# Patient Record
Sex: Female | Born: 1935 | Race: Black or African American | Hispanic: No | Marital: Single | Smoking: Never smoker
Health system: Southern US, Community
[De-identification: ages and names within clinical notes are randomized; demographics above are authoritative.]

## PROBLEM LIST (undated history)

## (undated) DIAGNOSIS — M810 Age-related osteoporosis without current pathological fracture: Secondary | ICD-10-CM

## (undated) DIAGNOSIS — E785 Hyperlipidemia, unspecified: Secondary | ICD-10-CM

## (undated) DIAGNOSIS — N189 Chronic kidney disease, unspecified: Secondary | ICD-10-CM

## (undated) DIAGNOSIS — M109 Gout, unspecified: Secondary | ICD-10-CM

## (undated) DIAGNOSIS — I10 Essential (primary) hypertension: Secondary | ICD-10-CM

## (undated) DIAGNOSIS — F0390 Unspecified dementia without behavioral disturbance: Secondary | ICD-10-CM

## (undated) HISTORY — PX: CHOLECYSTECTOMY: SHX55

## (undated) HISTORY — DX: Chronic kidney disease, unspecified: N18.9

## (undated) HISTORY — DX: Gout, unspecified: M10.9

## (undated) HISTORY — DX: Hyperlipidemia, unspecified: E78.5

## (undated) HISTORY — DX: Age-related osteoporosis without current pathological fracture: M81.0

---

## 1999-07-01 LAB — HM COLONOSCOPY: HM Colonoscopy: NORMAL

## 2005-08-31 ENCOUNTER — Ambulatory Visit: Payer: Self-pay | Admitting: Gastroenterology

## 2006-02-27 ENCOUNTER — Emergency Department: Payer: Self-pay | Admitting: Emergency Medicine

## 2008-02-21 ENCOUNTER — Ambulatory Visit: Payer: Self-pay | Admitting: Ophthalmology

## 2008-03-04 ENCOUNTER — Ambulatory Visit: Payer: Self-pay | Admitting: Ophthalmology

## 2009-04-17 DIAGNOSIS — Q423 Congenital absence, atresia and stenosis of anus without fistula: Secondary | ICD-10-CM | POA: Insufficient documentation

## 2009-04-17 DIAGNOSIS — K5900 Constipation, unspecified: Secondary | ICD-10-CM | POA: Insufficient documentation

## 2012-05-08 ENCOUNTER — Ambulatory Visit: Payer: Self-pay | Admitting: Family Medicine

## 2014-02-13 LAB — LIPID PANEL
Cholesterol: 232 mg/dL — AB (ref 0–200)
HDL: 50 mg/dL (ref 35–70)
LDL Cholesterol: 150 mg/dL
Triglycerides: 158 mg/dL (ref 40–160)

## 2014-04-11 DIAGNOSIS — H4011X2 Primary open-angle glaucoma, moderate stage: Secondary | ICD-10-CM | POA: Diagnosis not present

## 2014-05-14 DIAGNOSIS — L209 Atopic dermatitis, unspecified: Secondary | ICD-10-CM | POA: Diagnosis not present

## 2014-05-31 DIAGNOSIS — R21 Rash and other nonspecific skin eruption: Secondary | ICD-10-CM | POA: Diagnosis not present

## 2014-06-20 DIAGNOSIS — R21 Rash and other nonspecific skin eruption: Secondary | ICD-10-CM | POA: Diagnosis not present

## 2014-07-18 DIAGNOSIS — L270 Generalized skin eruption due to drugs and medicaments taken internally: Secondary | ICD-10-CM | POA: Diagnosis not present

## 2014-08-01 DIAGNOSIS — I10 Essential (primary) hypertension: Secondary | ICD-10-CM | POA: Diagnosis not present

## 2014-08-01 DIAGNOSIS — I129 Hypertensive chronic kidney disease with stage 1 through stage 4 chronic kidney disease, or unspecified chronic kidney disease: Secondary | ICD-10-CM | POA: Diagnosis not present

## 2014-08-01 DIAGNOSIS — N183 Chronic kidney disease, stage 3 (moderate): Secondary | ICD-10-CM | POA: Diagnosis not present

## 2014-08-01 DIAGNOSIS — N390 Urinary tract infection, site not specified: Secondary | ICD-10-CM | POA: Diagnosis not present

## 2014-08-19 ENCOUNTER — Ambulatory Visit (INDEPENDENT_AMBULATORY_CARE_PROVIDER_SITE_OTHER): Payer: Medicare Other | Admitting: Family Medicine

## 2014-08-19 ENCOUNTER — Other Ambulatory Visit: Payer: Self-pay | Admitting: Family Medicine

## 2014-08-19 ENCOUNTER — Encounter: Payer: Self-pay | Admitting: Family Medicine

## 2014-08-19 ENCOUNTER — Encounter (INDEPENDENT_AMBULATORY_CARE_PROVIDER_SITE_OTHER): Payer: Self-pay

## 2014-08-19 VITALS — BP 124/70 | HR 86 | Temp 97.8°F | Resp 16 | Ht <= 58 in | Wt 146.0 lb

## 2014-08-19 DIAGNOSIS — Z0000 Encounter for general adult medical examination without abnormal findings: Secondary | ICD-10-CM | POA: Diagnosis not present

## 2014-08-19 DIAGNOSIS — Z1211 Encounter for screening for malignant neoplasm of colon: Secondary | ICD-10-CM | POA: Diagnosis not present

## 2014-08-19 DIAGNOSIS — Z780 Asymptomatic menopausal state: Secondary | ICD-10-CM | POA: Diagnosis not present

## 2014-08-19 NOTE — Progress Notes (Signed)
Name: Julia Herrera   MRN: [id number]    DOB: [birth year]   Date:08/19/2014       Progress Note  Subjective  Chief Complaint  Patient here today for Annual Physical.    HPI  81 yo female with no new problems.    Past Medical History  Diagnosis Date  . Hyperlipidemia   . Osteoporosis   . Chronic kidney disease     Past Surgical History  Procedure Laterality Date  . Cholecystectomy      No family history on file.  History   Social History  . Marital Status: Single    Spouse Name: N/A  . Number of Children: N/A  . Years of Education: N/A   Occupational History  . Not on file.   Social History Main Topics  . Smoking status: Current Every Day Smoker  . Smokeless tobacco: Current User    Types: Snuff  . Alcohol Use: No  . Drug Use: No  . Sexual Activity: Not on file   Other Topics Concern  . Not on file   Social History Narrative  . No narrative on file    No current outpatient prescriptions on file.  Allergies  Allergen Reactions  . Allopurinol Other (See Comments)    dizziness  . Fosamax [Alendronate] Nausea Only  . Niaspan [Niacin Er] Other (See Comments)    unknown  . Zocor [Simvastatin] Other (See Comments)    Muscle pain     Review of Systems  Constitutional: Negative for fever.  HENT: Negative.   Eyes: Negative.   Respiratory: Negative.   Cardiovascular: Negative.   Gastrointestinal: Negative.   Genitourinary: Negative.   Musculoskeletal: Positive for back pain and joint pain.  Skin: Positive for itching and rash.  Neurological: Negative.   Endo/Heme/Allergies: Negative.   Psychiatric/Behavioral: Positive for depression.    Objective  There were no vitals filed for this visit.  Physical Exam  Constitutional: She is oriented to person, place, and time and well-developed, well-nourished, and in no distress.  HENT:  Head: Normocephalic and atraumatic.  Eyes: Pupils are equal, round, and reactive to light.  Neck: Normal range of  motion. Neck supple. No tracheal deviation present. No thyromegaly present.  Cardiovascular: Normal rate, regular rhythm, normal heart sounds and intact distal pulses.  Exam reveals no gallop.   No murmur heard. Pulmonary/Chest: Effort normal and breath sounds normal.  Abdominal: Soft. Bowel sounds are normal. She exhibits no distension and no mass. There is no tenderness. There is no rebound and no guarding.  Musculoskeletal: Normal range of motion. She exhibits no edema or tenderness.  Lymphadenopathy:    She has no cervical adenopathy.  Neurological: She is alert and oriented to person, place, and time. No cranial nerve deficit. She exhibits abnormal muscle tone. Gait normal. Coordination normal.  Skin: Skin is warm and dry. Rash (RUE with hyperpigmented rash) noted.  Psychiatric: Memory normal.    Depression screen PHQ 2/9 08/19/2014  Decreased Interest 0  Down, Depressed, Hopeless 0  PHQ - 2 Score 0       No results found for this or any previous visit (from the past 2160 hour(s)).   Assessment & Plan  1. Encounter for initial preventive physical examination covered by Medicare unremarkable  2. Post-menopausal  - DG Bone Density; Future  3. Screening for colon cancer - [place] Stool Card-TAKE HOME

## 2014-08-19 NOTE — Patient Instructions (Signed)
F/u in 4 mo

## 2014-08-26 ENCOUNTER — Emergency Department: Payer: Medicare Other

## 2014-08-26 ENCOUNTER — Encounter: Payer: Self-pay | Admitting: *Deleted

## 2014-08-26 ENCOUNTER — Emergency Department
Admission: EM | Admit: 2014-08-26 | Discharge: 2014-08-26 | Disposition: A | Discharge status: Home / Self Care | Payer: Medicare Other | Attending: Emergency Medicine | Admitting: Emergency Medicine

## 2014-08-26 DIAGNOSIS — I10 Essential (primary) hypertension: Secondary | ICD-10-CM | POA: Diagnosis not present

## 2014-08-26 DIAGNOSIS — Z87891 Personal history of nicotine dependence: Secondary | ICD-10-CM | POA: Insufficient documentation

## 2014-08-26 DIAGNOSIS — R55 Syncope and collapse: Secondary | ICD-10-CM | POA: Diagnosis not present

## 2014-08-26 DIAGNOSIS — Z79899 Other long term (current) drug therapy: Secondary | ICD-10-CM | POA: Diagnosis not present

## 2014-08-26 DIAGNOSIS — I951 Orthostatic hypotension: Secondary | ICD-10-CM | POA: Insufficient documentation

## 2014-08-26 DIAGNOSIS — R531 Weakness: Secondary | ICD-10-CM | POA: Diagnosis not present

## 2014-08-26 DIAGNOSIS — G311 Senile degeneration of brain, not elsewhere classified: Secondary | ICD-10-CM | POA: Diagnosis not present

## 2014-08-26 DIAGNOSIS — I959 Hypotension, unspecified: Secondary | ICD-10-CM | POA: Diagnosis not present

## 2014-08-26 HISTORY — DX: Essential (primary) hypertension: I10

## 2014-08-26 LAB — COMPREHENSIVE METABOLIC PANEL
ALBUMIN: 3.8 g/dL (ref 3.5–5.0)
ALT: 9 U/L — ABNORMAL LOW (ref 14–54)
ANION GAP: 8 (ref 5–15)
AST: 19 U/L (ref 15–41)
Alkaline Phosphatase: 53 U/L (ref 38–126)
BUN: 12 mg/dL (ref 6–20)
CHLORIDE: 105 mmol/L (ref 101–111)
CO2: 25 mmol/L (ref 22–32)
CREATININE: 2 mg/dL — AB (ref 0.44–1.00)
Calcium: 9 mg/dL (ref 8.9–10.3)
GFR calc Af Amer: 25 mL/min — ABNORMAL LOW (ref >60)
GFR calc non Af Amer: 21 mL/min — ABNORMAL LOW (ref >60)
Glucose, Bld: 103 mg/dL — ABNORMAL HIGH (ref 65–99)
POTASSIUM: 3.7 mmol/L (ref 3.5–5.1)
Sodium: 138 mmol/L (ref 135–145)
TOTAL PROTEIN: 7 g/dL (ref 6.5–8.1)
Total Bilirubin: 1 mg/dL (ref 0.3–1.2)

## 2014-08-26 LAB — CBC WITH DIFFERENTIAL/PLATELET
BASOS ABS: 0 10*3/uL (ref 0–0.1)
Basophils Relative: 1 %
EOS PCT: 9 %
Eosinophils Absolute: 0.3 10*3/uL (ref 0–0.7)
HCT: 42.6 % (ref 35.0–47.0)
Hemoglobin: 13.6 g/dL (ref 12.0–16.0)
Lymphocytes Relative: 30 %
Lymphs Abs: 1.1 10*3/uL (ref 1.0–3.6)
MCH: 28.1 pg (ref 26.0–34.0)
MCHC: 32 g/dL (ref 32.0–36.0)
MCV: 87.7 fL (ref 80.0–100.0)
Monocytes Absolute: 0.4 10*3/uL (ref 0.2–0.9)
Monocytes Relative: 12 %
Neutro Abs: 1.8 10*3/uL (ref 1.4–6.5)
Neutrophils Relative %: 48 %
PLATELETS: 274 10*3/uL (ref 150–440)
RBC: 4.86 MIL/uL (ref 3.80–5.20)
RDW: 16 % — AB (ref 11.5–14.5)
WBC: 3.7 10*3/uL (ref 3.6–11.0)

## 2014-08-26 LAB — TROPONIN I

## 2014-08-26 MED ORDER — SODIUM CHLORIDE 0.9 % IV BOLUS (SEPSIS)
500.0000 mL | INTRAVENOUS | Status: AC
  Administered 2014-08-26: 500 mL via INTRAVENOUS

## 2014-08-26 NOTE — ED Notes (Signed)
Pt to ED from home via EMS due to syncopal episode this am. Pt states she was making breakfast when she "went out" Pt states "I just squatted down so I didn't fall." Denies hitting head, denies any pain. Pt repeating "I just feel bad" Vitals wnl at this time. Per EMS pt hx of hypertension. Pt AAOx4 on arrival. No acute distress noted.

## 2014-08-26 NOTE — ED Provider Notes (Signed)
[facility] Emergency Department Provider Note  ____________________________________________  Time seen: Approximately 12:23 PM  I have reviewed the triage vital signs and the nursing notes.   HISTORY  Chief Complaint Loss of Consciousness and Weakness    HPI Julia Herrera is a 81 y.o. female who presents with a history of near-syncope.  She states that she was working in the kitchen and then was walking back out to the living room when she became lightheaded and thought that she was going to pass out.  She squatted down so that she did not fall.  Her daughter was with her and confirms that there is no loss of consciousness but that she sat on the floor and just looking around for a short period of time.  Currently the patient denies any symptoms or problems.  She states that she did not have any chest pain, shortness of breath, abdominal pain, nausea, or vomiting.  She has not had any dysuria recently.  She is alert and oriented here in the emergency department and her daughter states that she is at her baseline mental status.She has had no numbness or weakness in any of her extremities and no difficulty speaking.   Past Medical History  Diagnosis Date  . Hyperlipidemia   . Osteoporosis   . Chronic kidney disease   . Hypertension     There are no active problems to display for this patient.   Past Surgical History  Procedure Laterality Date  . Cholecystectomy      Current Outpatient Rx  Name  Route  Sig  Dispense  Refill  . amLODipine (NORVASC) 5 MG tablet      5 mg.         . doxepin (SINEQUAN) 25 MG capsule               . omeprazole (PRILOSEC) 20 MG capsule   Oral   Take 20 mg by mouth daily.         . valsartan-hydrochlorothiazide (DIOVAN-HCT) 160-12.5 MG per tablet      1 tablet.         Marland Kitchen ZETIA 10 MG tablet      TAKE 1 TABLET BY MOUTH EVERY DAY.   30 tablet   5     Allergies Allopurinol; Fosamax; Niaspan; and  Zocor  History reviewed. No pertinent family history.  Social History History  Substance Use Topics  . Smoking status: Former Research scientist (life sciences)  . Smokeless tobacco: Current User    Types: Snuff  . Alcohol Use: No     Comment: Former    Review of Systems Constitutional: No fever/chills Eyes: No visual changes. ENT: No sore throat. Cardiovascular: Denies chest pain. Respiratory: Denies shortness of breath. Gastrointestinal: No abdominal pain.  No nausea, no vomiting.  No diarrhea.  No constipation. Genitourinary: Negative for dysuria. Musculoskeletal: Negative for back pain. Skin: Negative for rash. Neurological: Negative for headaches, focal weakness or numbness.  Previously lightheaded, now resolved  10-point ROS otherwise negative.  ____________________________________________   PHYSICAL EXAM:  VITAL SIGNS: ED Triage Vitals  Enc Vitals Group     BP 08/26/14 1205 113/74 mmHg     Pulse Rate 08/26/14 1205 75     Resp 08/26/14 1205 22     Temp 08/26/14 1205 98.4 F (36.9 C)     Temp Source 08/26/14 1205 Oral     SpO2 08/26/14 1205 96 %     Weight 08/26/14 1205 147 lb 14.9 oz (67.1 kg)  Height 08/26/14 1205 5' (1.524 m)     Head Cir --      Peak Flow --      Pain Score --      Pain Loc --      Pain Edu? --      Excl. in [place]? --     Constitutional: Alert and oriented elderly female. Well appearing and in no acute distress. Eyes: Conjunctivae are normal. PERRL. EOMI. Head: Atraumatic. Nose: No congestion/rhinnorhea. Mouth/Throat: Mucous membranes are moist.  Oropharynx non-erythematous. Neck: No stridor.  No cervical spine tenderness to palpation. Cardiovascular: Normal rate, regular rhythm. Grossly normal heart sounds.  Good peripheral circulation. Respiratory: Normal respiratory effort.  No retractions. Lungs CTAB. Gastrointestinal: Soft and nontender. No distention. No abdominal bruits. No CVA tenderness. Musculoskeletal: No lower extremity tenderness nor edema.  No  joint effusions. Neurologic:  Normal speech and language. No gross focal neurologic deficits are appreciated. Speech is normal.  Skin:  Skin is warm, dry and intact. No rash noted. Psychiatric: Mood and affect are normal. Speech and behavior are normal.  ____________________________________________   LABS (all labs ordered are listed, but only abnormal results are displayed)  Labs Reviewed  CBC WITH DIFFERENTIAL/PLATELET - Abnormal; Notable for the following:    RDW 16.0 (*)    All other components within normal limits  COMPREHENSIVE METABOLIC PANEL - Abnormal; Notable for the following:    Glucose, Bld 103 (*)    Creatinine, Ser 2.00 (*)    ALT 9 (*)    GFR calc non Af Amer 21 (*)    GFR calc Af Amer 25 (*)    All other components within normal limits  TROPONIN I   ____________________________________________  EKG  ED ECG REPORT I, Saffron Busey, the attending physician, personally viewed and interpreted this ECG.   Date: 08/26/2014  EKG Time: 12:00  Rate: 71  Rhythm: normal sinus rhythm, frequent PVC's noted  Axis: Normal  Intervals:left bundle branch block  ST&T Change: Non-specific ST segment / T-wave changes, but no evidence of acute ischemia. Does not meet Sgarbossa criteria for STEMI w/ LBBB   ____________________________________________  RADIOLOGY  Ct Head Wo Contrast  08/26/2014   CLINICAL DATA:  Syncope  EXAM: CT HEAD WITHOUT CONTRAST  TECHNIQUE: Contiguous axial images were obtained from the base of the skull through the vertex without intravenous contrast.  COMPARISON:  None.  FINDINGS: No evidence of parenchymal hemorrhage or extra-axial fluid collection. No mass lesion, mass effect, or midline shift.  No CT evidence of acute infarction.  Subcortical white matter and periventricular small vessel ischemic changes. Intracranial atherosclerosis  Global cortical atrophy.  No ventriculomegaly.  The visualized paranasal sinuses are essentially clear. The mastoid  air cells are unopacified.  No evidence of calvarial fracture.  IMPRESSION: No evidence of acute intracranial abnormality.  Atrophy with small vessel ischemic changes.   Electronically Signed   By: Julian Hy M.D.   On: 08/26/2014 14:12    ____________________________________________   PROCEDURES  Procedure(s) performed: None  Critical Care performed: No ____________________________________________   INITIAL IMPRESSION / ASSESSMENT AND PLAN / ED COURSE  Pertinent labs & imaging results that were available during my care of the patient were reviewed by me and considered in my medical decision making (see chart for details).  The patient is well-appearing with normal vitals.  She is asymptomatic at this time.  I believe that she had an episode of orthostatic hypotension or perhaps a vasovagal episode.  However, given her age,  we will perform a basic workup.  Her EKG, while notable for frequent PVCs, is generally unremarkable.  I doubt that she had an acute cardiac event given that she is otherwise asymptomatic, though it is still possible.  Most likely she is somewhat volume depleted.  She reports chronic kidney disease states that she has not been eating or drinking well.  She has no respiratory symptoms and I do not believe a chest x-ray would be helpful.  She has no urologic symptoms or complaints at this time and is not exhibiting any signs or symptoms of altered mental status to suggest a UTI.  I will provide a small normal saline fluid bolus of 500 mL and obtain a CT scan of her head and reassess.  ----------------------------------------- 3:45 PM on 08/26/2014 -----------------------------------------  Patient states she feels better and "I'm ready to Josephine!!"  I discussed with 2 of her daughters the need for outpatient follow-up and her relatively reassuring exam today.  They understand and agree. ____________________________________________  FINAL CLINICAL IMPRESSION(S) / ED  DIAGNOSES  Final diagnoses:  Orthostatic hypotension      NEW MEDICATIONS STARTED DURING THIS VISIT:  Discharge Medication List as of 08/26/2014  3:47 PM       Hinda Kehr, MD 08/26/14 2228

## 2014-08-26 NOTE — Discharge Instructions (Signed)
We believe you got lightheaded today because of being a little bit dehydrated.  You felt better after some IV fluids.  Please follow-up with your regular doctor at the next available appointment to have your labs rechecked and to discuss your episode with him.  If you develop new or worsening symptoms that concern you, please return to the emergency department.   Near-Syncope Near-syncope (commonly known as near fainting) is sudden weakness, dizziness, or feeling like you might pass out. During an episode of near-syncope, you may also develop pale skin, have tunnel vision, or feel sick to your stomach (nauseous). Near-syncope may occur when getting up after sitting or while standing for a long time. It is caused by a sudden decrease in blood flow to the brain. This decrease can result from various causes or triggers, most of which are not serious. However, because near-syncope can sometimes be a sign of something serious, a medical evaluation is required. The specific cause is often not determined. HOME CARE INSTRUCTIONS  Monitor your condition for any changes. The following actions may help to alleviate any discomfort you are experiencing:  Have someone stay with you until you feel stable.  Lie down right away and prop your feet up if you start feeling like you might faint. Breathe deeply and steadily. Wait until all the symptoms have passed. Most of these episodes last only a few minutes. You may feel tired for several hours.   Drink enough fluids to keep your urine clear or pale yellow.   If you are taking blood pressure or heart medicine, get up slowly when seated or lying down. Take several minutes to sit and then stand. This can reduce dizziness.  Follow up with your health care provider as directed. SEEK IMMEDIATE MEDICAL CARE IF:   You have a severe headache.   You have unusual pain in the chest, abdomen, or back.   You are bleeding from the mouth or rectum, or you have black or  tarry stool.   You have an irregular or very fast heartbeat.   You have repeated fainting or have seizure-like jerking during an episode.   You faint when sitting or lying down.   You have confusion.   You have difficulty walking.   You have severe weakness.   You have vision problems.  MAKE SURE YOU:   Understand these instructions.  Will watch your condition.  Will get help right away if you are not doing well or get worse. Document Released: 03/01/2005 Document Revised: 03/06/2013 Document Reviewed: 08/04/2012 [facility] Patient Information 2015 [place], [place]. This information is not intended to replace advice given to you by your health care provider. Make sure you discuss any questions you have with your health care provider.

## 2014-08-29 DIAGNOSIS — L270 Generalized skin eruption due to drugs and medicaments taken internally: Secondary | ICD-10-CM | POA: Diagnosis not present

## 2014-10-10 DIAGNOSIS — H4011X2 Primary open-angle glaucoma, moderate stage: Secondary | ICD-10-CM | POA: Diagnosis not present

## 2014-12-23 ENCOUNTER — Encounter: Payer: Self-pay | Admitting: Family Medicine

## 2014-12-23 ENCOUNTER — Ambulatory Visit (INDEPENDENT_AMBULATORY_CARE_PROVIDER_SITE_OTHER): Payer: Medicare Other | Admitting: Family Medicine

## 2014-12-23 VITALS — BP 118/68 | HR 105 | Temp 98.7°F | Resp 16 | Ht 60.0 in | Wt 147.3 lb

## 2014-12-23 DIAGNOSIS — K2100 Gastro-esophageal reflux disease with esophagitis, without bleeding: Secondary | ICD-10-CM | POA: Insufficient documentation

## 2014-12-23 DIAGNOSIS — N184 Chronic kidney disease, stage 4 (severe): Secondary | ICD-10-CM | POA: Diagnosis not present

## 2014-12-23 DIAGNOSIS — M810 Age-related osteoporosis without current pathological fracture: Secondary | ICD-10-CM

## 2014-12-23 DIAGNOSIS — E785 Hyperlipidemia, unspecified: Secondary | ICD-10-CM

## 2014-12-23 DIAGNOSIS — E669 Obesity, unspecified: Secondary | ICD-10-CM | POA: Insufficient documentation

## 2014-12-23 DIAGNOSIS — M109 Gout, unspecified: Secondary | ICD-10-CM | POA: Insufficient documentation

## 2014-12-23 DIAGNOSIS — Z23 Encounter for immunization: Secondary | ICD-10-CM | POA: Diagnosis not present

## 2014-12-23 DIAGNOSIS — I10 Essential (primary) hypertension: Secondary | ICD-10-CM | POA: Diagnosis not present

## 2014-12-23 DIAGNOSIS — I129 Hypertensive chronic kidney disease with stage 1 through stage 4 chronic kidney disease, or unspecified chronic kidney disease: Secondary | ICD-10-CM | POA: Insufficient documentation

## 2014-12-23 DIAGNOSIS — L209 Atopic dermatitis, unspecified: Secondary | ICD-10-CM | POA: Insufficient documentation

## 2014-12-23 DIAGNOSIS — N183 Chronic kidney disease, stage 3 unspecified: Secondary | ICD-10-CM | POA: Insufficient documentation

## 2014-12-23 NOTE — Progress Notes (Signed)
Name: Julia Herrera   MRN: [id number]    DOB: [birth year]   Date:12/23/2014       Progress Note  Subjective  Chief Complaint  Chief Complaint  Patient presents with  . Gout  . Hyperlipidemia  . Osteoporosis    HPI  Hypertension   Patient presents for follow-up of hypertension. It has been presenor over over 5 years.  Patient states that there is compliance with medical regimen which consists of amlodipine 5 mg daily and valsartan HCT 160-12.5 . There is no end organ disease. Cardiac risk factors include hypertension hyperlipidemia and diabetes.  Exercise regimen consist of minimal minimal sodium is minimal salt restriction .  Diet consist of minimal sodium restriction .  Hyperlipidemia  Patient has a history of hyperlipidemia for over 5 years.  Current medical regimen consist of zetia 10 mg daily and she is statin intolerant   .  Compliance is good .  Diet and exercise are currently followed minimally. .  Risk factors for cardiovascular disease include hyperlipidemia , age, hypertension .   There have been no side effects from the medication.      Osteoporosis  Patient has had bone density over 4 years ago.  Chronic kidney disease patient is currently being followed by nephrologist her last recorded EGFR the rehabilitation is 25 6/16  Past Medical History  Diagnosis Date  . Hyperlipidemia   . Osteoporosis   . Chronic kidney disease   . Hypertension     Social History  Substance Use Topics  . Smoking status: Former Research scientist (life sciences)  . Smokeless tobacco: Current User    Types: Snuff  . Alcohol Use: No     Comment: Former     Current outpatient prescriptions:  .  amLODipine (NORVASC) 5 MG tablet, 5 mg., Disp: , Rfl:  .  doxepin (SINEQUAN) 25 MG capsule, , Disp: , Rfl:  .  omeprazole (PRILOSEC) 20 MG capsule, Take 20 mg by mouth daily., Disp: , Rfl:  .  potassium chloride SA (KLOR-CON M20) 20 MEQ tablet, KLOR-CON M20 ORAL TABLET CONTROLLED RELEASE 20 MEQ  1 Every Day for 0 days   Quantity: 30.00;  Refills: 0   Ordered :27-Feb-2008  Ashok Norris MD;  Started 27-Feb-2008 Discontinued Comments: DX: 401.1 helenj (02/27/2008) - [facility], Disp: , Rfl:  .  sulfamethoxazole-trimethoprim (BACTRIM DS,SEPTRA DS) 800-160 MG tablet, Take 1 tablet by mouth 2 (two) times daily., Disp: , Rfl:  .  valsartan-hydrochlorothiazide (DIOVAN-HCT) 160-12.5 MG per tablet, 1 tablet., Disp: , Rfl:  .  ZETIA 10 MG tablet, TAKE 1 TABLET BY MOUTH EVERY DAY., Disp: 30 tablet, Rfl: 5  Allergies  Allergen Reactions  . Allopurinol Other (See Comments)    dizziness  . Fosamax [Alendronate] Nausea Only  . Niaspan [Niacin Er] Other (See Comments)    unknown  . Zocor [Simvastatin] Other (See Comments)    Muscle pain    Review of Systems  Constitutional: Negative for fever, chills and weight loss.  HENT: Negative for congestion, hearing loss, sore throat and tinnitus.   Eyes: Negative for blurred vision, double vision and redness.  Respiratory: Negative for cough, hemoptysis and shortness of breath.   Cardiovascular: Negative for chest pain, palpitations, orthopnea, claudication and leg swelling.  Gastrointestinal: Positive for constipation. Negative for heartburn, nausea, vomiting, diarrhea and blood in stool.  Genitourinary: Negative for dysuria, urgency, frequency and hematuria.  Musculoskeletal: Positive for back pain and joint pain. Negative for myalgias,  falls and neck pain.  Skin: Negative for itching.  Neurological: Negative for dizziness, tingling, tremors, focal weakness, seizures, loss of consciousness, weakness and headaches.  Endo/Heme/Allergies: Does not bruise/bleed easily.  Psychiatric/Behavioral: Negative for depression and substance abuse. The patient is not nervous/anxious and does not have insomnia.      Objective  Filed Vitals:   12/23/14 0818  BP: 118/68  Pulse: 105  Temp: 98.7 F (37.1 C)  Resp: 16  Height: 5'  (1.524 m)  Weight: 147 lb 5 oz (66.821 kg)  SpO2: 96%     Physical Exam  Constitutional: She is oriented to person, place, and time and well-developed, well-nourished, and in no distress.  HENT:  Head: Normocephalic.  Eyes: EOM are normal. Pupils are equal, round, and reactive to light.  Neck: Normal range of motion. No thyromegaly present.  Cardiovascular: Normal rate, regular rhythm and normal heart sounds.   No murmur heard. Pulmonary/Chest: Effort normal and breath sounds normal.  Musculoskeletal: Normal range of motion. She exhibits tenderness. She exhibits no edema.  Neurological: She is alert and oriented to person, place, and time. No cranial nerve deficit. Gait normal.  Skin: Skin is warm and dry. No rash noted.  Psychiatric: Memory and affect normal.      Assessment & Plan  1. Hyperlipidemia Lipid panel today - Lipid Profile - TSH  2. Essential hypertension CMP - Comprehensive Metabolic Panel (CMET)  3. CKD (chronic kidney disease), unspecified stage Continue nephrologist  4. Osteoporosis Check bone density today - DG Bone Density; Future  5. Need for influenza vaccination Given today - Flu vaccine HIGH DOSE PF (Fluzone High dose)  6. Gout, unspecified cause, unspecified chronicity, unspecified site Check uric acid - Uric acid

## 2014-12-24 LAB — TSH: TSH: 2.37 u[IU]/mL (ref 0.450–4.500)

## 2014-12-24 LAB — COMPREHENSIVE METABOLIC PANEL
A/G RATIO: 1.5 (ref 1.1–2.5)
ALT: 8 IU/L (ref 0–32)
AST: 16 IU/L (ref 0–40)
Albumin: 4.4 g/dL (ref 3.5–4.7)
Alkaline Phosphatase: 66 IU/L (ref 39–117)
BUN/Creatinine Ratio: 6 — ABNORMAL LOW (ref 11–26)
BUN: 12 mg/dL (ref 8–27)
Bilirubin Total: 1.1 mg/dL (ref 0.0–1.2)
CO2: 23 mmol/L (ref 18–29)
Calcium: 10 mg/dL (ref 8.7–10.3)
Chloride: 101 mmol/L (ref 97–108)
Creatinine, Ser: 1.89 mg/dL — ABNORMAL HIGH (ref 0.57–1.00)
GFR calc Af Amer: 27 mL/min/{1.73_m2} — ABNORMAL LOW (ref >59)
GFR, EST NON AFRICAN AMERICAN: 24 mL/min/{1.73_m2} — AB (ref >59)
GLOBULIN, TOTAL: 3 g/dL (ref 1.5–4.5)
Glucose: 99 mg/dL (ref 65–99)
POTASSIUM: 4.8 mmol/L (ref 3.5–5.2)
SODIUM: 142 mmol/L (ref 134–144)
TOTAL PROTEIN: 7.4 g/dL (ref 6.0–8.5)

## 2014-12-24 LAB — LIPID PANEL
CHOL/HDL RATIO: 3.9 ratio (ref 0.0–4.4)
Cholesterol, Total: 224 mg/dL — ABNORMAL HIGH (ref 100–199)
HDL: 58 mg/dL (ref >39)
LDL Calculated: 147 mg/dL — ABNORMAL HIGH (ref 0–99)
Triglycerides: 95 mg/dL (ref 0–149)
VLDL Cholesterol Cal: 19 mg/dL (ref 5–40)

## 2014-12-24 LAB — URIC ACID: Uric Acid: 9.4 mg/dL — ABNORMAL HIGH (ref 2.5–7.1)

## 2015-01-11 ENCOUNTER — Other Ambulatory Visit: Payer: Self-pay | Admitting: Family Medicine

## 2015-01-16 ENCOUNTER — Other Ambulatory Visit: Payer: Self-pay | Admitting: Family Medicine

## 2015-03-18 ENCOUNTER — Other Ambulatory Visit: Payer: Self-pay | Admitting: Family Medicine

## 2015-04-04 DIAGNOSIS — N183 Chronic kidney disease, stage 3 (moderate): Secondary | ICD-10-CM | POA: Diagnosis not present

## 2015-04-04 DIAGNOSIS — I129 Hypertensive chronic kidney disease with stage 1 through stage 4 chronic kidney disease, or unspecified chronic kidney disease: Secondary | ICD-10-CM | POA: Diagnosis not present

## 2015-04-04 DIAGNOSIS — I10 Essential (primary) hypertension: Secondary | ICD-10-CM | POA: Diagnosis not present

## 2015-04-09 DIAGNOSIS — Z961 Presence of intraocular lens: Secondary | ICD-10-CM | POA: Diagnosis not present

## 2015-04-28 ENCOUNTER — Ambulatory Visit: Payer: Self-pay | Admitting: Family Medicine

## 2015-05-16 ENCOUNTER — Ambulatory Visit: Payer: Self-pay | Admitting: Family Medicine

## 2015-06-09 ENCOUNTER — Other Ambulatory Visit: Payer: Self-pay | Admitting: Family Medicine

## 2015-06-20 ENCOUNTER — Ambulatory Visit: Payer: Self-pay | Admitting: Family Medicine

## 2015-07-14 ENCOUNTER — Ambulatory Visit: Payer: Self-pay | Admitting: Family Medicine

## 2015-07-17 ENCOUNTER — Ambulatory Visit (INDEPENDENT_AMBULATORY_CARE_PROVIDER_SITE_OTHER): Payer: Medicare Other | Admitting: Family Medicine

## 2015-07-17 ENCOUNTER — Encounter: Payer: Self-pay | Admitting: Family Medicine

## 2015-07-17 VITALS — BP 124/76 | HR 84 | Temp 97.9°F | Resp 14 | Ht 60.0 in | Wt 145.1 lb

## 2015-07-17 DIAGNOSIS — L309 Dermatitis, unspecified: Secondary | ICD-10-CM | POA: Diagnosis not present

## 2015-07-17 MED ORDER — HYDROXYZINE HCL 25 MG PO TABS: 25.0000 mg | ORAL_TABLET | Freq: Three times a day (TID) | ORAL | Status: DC | PRN

## 2015-07-17 MED ORDER — MOMETASONE FUROATE 0.1 % EX CREA: 1.0000 "application " | TOPICAL_CREAM | Freq: Every day | CUTANEOUS | Status: DC

## 2015-07-17 NOTE — Progress Notes (Signed)
Name: Julia Herrera   MRN: [id number]    DOB: Feb 10, 1926   Date:07/17/2015       Progress Note  Subjective  Chief Complaint  Chief Complaint  Patient presents with  . Rash  . Allergic Reaction    HPI  Generalized Pruritus: Pt. Presents for greater than 1 month history of itching all over, mainly at night. She was told by PCP at last visit that she had a rash on her right arm. However, she scratches her abdominal area at night.   Past Medical History  Diagnosis Date  . Hyperlipidemia   . Osteoporosis   . Chronic kidney disease   . Hypertension     Past Surgical History  Procedure Laterality Date  . Cholecystectomy      History reviewed. No pertinent family history.  Social History   Social History  . Marital Status: Single    Spouse Name: N/A  . Number of Children: N/A  . Years of Education: N/A   Occupational History  . Not on file.   Social History Main Topics  . Smoking status: Former Research scientist (life sciences)  . Smokeless tobacco: Current User    Types: Snuff  . Alcohol Use: No     Comment: Former  . Drug Use: No  . Sexual Activity: Not on file   Other Topics Concern  . Not on file   Social History Narrative     Current outpatient prescriptions:  .  allopurinol (ZYLOPRIM) 100 MG tablet, TAKE 1 TABLET BY MOUTH EVERY DAY., Disp: 30 tablet, Rfl: 1 .  amLODipine (NORVASC) 5 MG tablet, 5 mg., Disp: , Rfl:  .  doxepin (SINEQUAN) 25 MG capsule, , Disp: , Rfl:  .  omeprazole (PRILOSEC) 20 MG capsule, TAKE ONE (1) CAPSULE EACH DAY, Disp: 30 capsule, Rfl: 1 .  potassium chloride SA (KLOR-CON M20) 20 MEQ tablet, KLOR-CON M20 ORAL TABLET CONTROLLED RELEASE 20 MEQ  1 Every Day for 0 days  Quantity: 30.00;  Refills: 0   Ordered :27-Feb-2008  Julia Norris MD;  Started 27-Feb-2008 Discontinued Comments: DX: 401.1 helenj (02/27/2008) - [facility], Disp: , Rfl:  .  sulfamethoxazole-trimethoprim (BACTRIM DS,SEPTRA DS) 800-160 MG tablet,  Take 1 tablet by mouth 2 (two) times daily., Disp: , Rfl:  .  valsartan-hydrochlorothiazide (DIOVAN-HCT) 160-12.5 MG tablet, TAKE ONE (1) TABLET EACH DAY, Disp: 30 tablet, Rfl: 5 .  ZETIA 10 MG tablet, TAKE 1 TABLET BY MOUTH EVERY DAY., Disp: 30 tablet, Rfl: 5  Allergies  Allergen Reactions  . Allopurinol Other (See Comments)    dizziness  . Fosamax [Alendronate] Nausea Only  . Niaspan [Niacin Er] Other (See Comments)    unknown  . Zocor [Simvastatin] Other (See Comments)    Muscle pain     Review of Systems  Constitutional: Negative for fever and chills.  Skin: Positive for itching and rash.     Objective  Filed Vitals:   07/17/15 1139  BP: 124/76  Pulse: 84  Temp: 97.9 F (36.6 C)  TempSrc: Oral  Resp: 14  Height: 5' (1.524 m)  Weight: 145 lb 1.6 oz (65.817 kg)  SpO2: 97%    Physical Exam  Skin: Rash noted. Rash is macular.  Dry scaly lesion on the right middle abdomen area, erythematous mildly pruritic area on her central abdomen    Assessment & Plan  1. Dermatitis Likely dermatitis which is exacerbated by dry skin. Recommend topical corticosteroid therapy. Antihistamine at night to  relieve pruritus. - Comprehensive Metabolic Panel (CMET) - mometasone (ELOCON) 0.1 % cream; Apply 1 application topically daily.  Dispense: 15 g; Refill: 0 - hydrOXYzine (ATARAX/VISTARIL) 25 MG tablet; Take 1 tablet (25 mg total) by mouth 3 (three) times daily as needed.  Dispense: 30 tablet; Refill: 0   [facility] 07/17/2015 11:43 AM

## 2015-07-18 LAB — COMPREHENSIVE METABOLIC PANEL
ALBUMIN: 4.1 g/dL (ref 3.5–4.7)
ALK PHOS: 62 IU/L (ref 39–117)
ALT: 5 IU/L (ref 0–32)
AST: 14 IU/L (ref 0–40)
Albumin/Globulin Ratio: 1.5 (ref 1.2–2.2)
BILIRUBIN TOTAL: 0.9 mg/dL (ref 0.0–1.2)
BUN/Creatinine Ratio: 6 — ABNORMAL LOW (ref 12–28)
BUN: 9 mg/dL (ref 8–27)
CO2: 22 mmol/L (ref 18–29)
CREATININE: 1.58 mg/dL — AB (ref 0.57–1.00)
Calcium: 9.5 mg/dL (ref 8.7–10.3)
Chloride: 102 mmol/L (ref 96–106)
GFR calc Af Amer: 34 mL/min/{1.73_m2} — ABNORMAL LOW (ref >59)
GFR calc non Af Amer: 29 mL/min/{1.73_m2} — ABNORMAL LOW (ref >59)
GLOBULIN, TOTAL: 2.8 g/dL (ref 1.5–4.5)
Glucose: 99 mg/dL (ref 65–99)
Potassium: 3.7 mmol/L (ref 3.5–5.2)
SODIUM: 144 mmol/L (ref 134–144)
Total Protein: 6.9 g/dL (ref 6.0–8.5)

## 2015-08-04 ENCOUNTER — Encounter: Payer: Self-pay | Admitting: Family Medicine

## 2015-08-04 ENCOUNTER — Ambulatory Visit (INDEPENDENT_AMBULATORY_CARE_PROVIDER_SITE_OTHER): Payer: Medicare Other | Admitting: Family Medicine

## 2015-08-04 VITALS — BP 122/77 | HR 88 | Temp 97.7°F | Resp 16 | Ht 60.0 in | Wt 147.8 lb

## 2015-08-04 DIAGNOSIS — E785 Hyperlipidemia, unspecified: Secondary | ICD-10-CM

## 2015-08-04 DIAGNOSIS — I10 Essential (primary) hypertension: Secondary | ICD-10-CM

## 2015-08-04 DIAGNOSIS — M1A3720 Chronic gout due to renal impairment, left ankle and foot, without tophus (tophi): Secondary | ICD-10-CM | POA: Diagnosis not present

## 2015-08-04 MED ORDER — AMLODIPINE BESYLATE 5 MG PO TABS: 5.0000 mg | ORAL_TABLET | Freq: Every day | ORAL | Status: DC

## 2015-08-04 MED ORDER — ALLOPURINOL 100 MG PO TABS: 100.0000 mg | ORAL_TABLET | Freq: Every day | ORAL | Status: DC

## 2015-08-04 NOTE — Progress Notes (Signed)
Name: Julia Herrera   MRN: [id number]    DOB: 1925-10-08   Date:08/04/2015       Progress Note  Subjective  Chief Complaint  Chief Complaint  Patient presents with  . Medication Refill    linzess 290 mcg / amlodipine 5 mg / allopurinol 100 mg     HPI  Gout: Chronic Gout, last known attack was long time ago (maybe last year, although she is not sure). Usually affects her feet. She is taking Allopurinol 100 mg daily, last eGFR was 58m/min.  Hypertension: BP is stable, takes Amlodipine 569mdaily, feels well, no side effects.  Hyperlipidemia Elevated Total cholesterol and LDL, she is on Zetia 10 mg daily. She eats a relatively healthy diet. Does admit to eating sausage, bacon, and country ham.  Past Medical History  Diagnosis Date  . Hyperlipidemia   . Osteoporosis   . Chronic kidney disease   . Hypertension     Past Surgical History  Procedure Laterality Date  . Cholecystectomy      History reviewed. No pertinent family history.  Social History   Social History  . Marital Status: Single    Spouse Name: N/A  . Number of Children: N/A  . Years of Education: N/A   Occupational History  . Not on file.   Social History Main Topics  . Smoking status: Former SmResearch scientist (life sciences). Smokeless tobacco: Current User    Types: Snuff  . Alcohol Use: No     Comment: Former  . Drug Use: No  . Sexual Activity: Not on file   Other Topics Concern  . Not on file   Social History Narrative     Current outpatient prescriptions:  .  allopurinol (ZYLOPRIM) 100 MG tablet, TAKE 1 TABLET BY MOUTH EVERY DAY., Disp: 30 tablet, Rfl: 1 .  amLODipine (NORVASC) 5 MG tablet, 5 mg., Disp: , Rfl:  .  doxepin (SINEQUAN) 25 MG capsule, , Disp: , Rfl:  .  hydrOXYzine (ATARAX/VISTARIL) 25 MG tablet, Take 1 tablet (25 mg total) by mouth 3 (three) times daily as needed., Disp: 30 tablet, Rfl: 0 .  mometasone (ELOCON) 0.1 % cream, Apply 1 application topically daily., Disp: 15 g, Rfl: 0 .  omeprazole  (PRILOSEC) 20 MG capsule, TAKE ONE (1) CAPSULE EACH DAY, Disp: 30 capsule, Rfl: 1 .  potassium chloride SA (KLOR-CON M20) 20 MEQ tablet, KLOR-CON M20 ORAL TABLET CONTROLLED RELEASE 20 MEQ  1 Every Day for 0 days  Quantity: 30.00;  Refills: 0   Ordered :27-Feb-2008  MoAshok NorrisD;  Started 27-Feb-2008 Discontinued Comments: DX: 401.1 Julia Herrera (02/27/2008) [facility], Disp: , Rfl:  .  sulfamethoxazole-trimethoprim (BACTRIM DS,SEPTRA DS) 800-160 MG tablet, Take 1 tablet by mouth 2 (two) times daily., Disp: , Rfl:  .  valsartan-hydrochlorothiazide (DIOVAN-HCT) 160-12.5 MG tablet, TAKE ONE (1) TABLET EACH DAY, Disp: 30 tablet, Rfl: 5 .  ZETIA 10 MG tablet, TAKE 1 TABLET BY MOUTH EVERY DAY., Disp: 30 tablet, Rfl: 5  Allergies  Allergen Reactions  . Allopurinol Other (See Comments)    dizziness  . Fosamax [Alendronate] Nausea Only  . Niaspan [Niacin Er] Other (See Comments)    unknown  . Zocor [Simvastatin] Other (See Comments)    Muscle pain     Review of Systems  Constitutional: Negative for fever, chills and weight loss.  Eyes: Negative for blurred vision.  Respiratory: Negative for cough and shortness of breath.   Cardiovascular:  Negative for chest pain.  Gastrointestinal: Negative for abdominal pain.  Musculoskeletal: Negative for myalgias and joint pain.  Neurological: Positive for headaches.   Objective  Filed Vitals:   08/04/15 1107  BP: 122/77  Pulse: 88  Temp: 97.7 F (36.5 C)  TempSrc: Oral  Resp: 16  Height: 5' (1.524 m)  Weight: 147 lb 12.8 oz (67.042 kg)  SpO2: 99%    Physical Exam  Constitutional: She is oriented to person, place, and time and well-developed, well-nourished, and in no distress.  HENT:  Head: Normocephalic and atraumatic.  Cardiovascular: Normal rate and regular rhythm.   Pulmonary/Chest: Effort normal and breath sounds normal.  Abdominal: Soft. Bowel sounds are normal.  Musculoskeletal:        Right foot: There is no tenderness.       Left foot: There is no tenderness.  Neurological: She is alert and oriented to person, place, and time.  Nursing note and vitals reviewed.    Assessment & Plan  1. Essential hypertension BP at goal and controlled on present therapy - amLODipine (NORVASC) 5 MG tablet; Take 1 tablet (5 mg total) by mouth daily.  Dispense: 90 tablet; Refill: 1  2. 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 - allopurinol (ZYLOPRIM) 100 MG tablet; Take 1 tablet (100 mg total) by mouth daily.  Dispense: 90 tablet; Refill: 1 - Uric acid  3. Hyperlipidemia Patient on Zetia 10 mg daily, repeat FLP today. - Comprehensive Metabolic Panel (CMET) - Lipid Profile   [name] Asad A. [facility] 08/04/2015 11:34 AM

## 2015-08-05 LAB — COMPREHENSIVE METABOLIC PANEL
A/G RATIO: 1.6 (ref 1.2–2.2)
ALBUMIN: 4.2 g/dL (ref 3.5–4.7)
ALT: 7 IU/L (ref 0–32)
AST: 13 IU/L (ref 0–40)
Alkaline Phosphatase: 62 IU/L (ref 39–117)
BILIRUBIN TOTAL: 0.9 mg/dL (ref 0.0–1.2)
BUN / CREAT RATIO: 6 — AB (ref 12–28)
BUN: 11 mg/dL (ref 8–27)
CHLORIDE: 101 mmol/L (ref 96–106)
CO2: 23 mmol/L (ref 18–29)
Calcium: 9.7 mg/dL (ref 8.7–10.3)
Creatinine, Ser: 1.78 mg/dL — ABNORMAL HIGH (ref 0.57–1.00)
GFR calc Af Amer: 29 mL/min/{1.73_m2} — ABNORMAL LOW (ref >59)
GFR calc non Af Amer: 25 mL/min/{1.73_m2} — ABNORMAL LOW (ref >59)
GLOBULIN, TOTAL: 2.6 g/dL (ref 1.5–4.5)
Glucose: 93 mg/dL (ref 65–99)
Potassium: 4.1 mmol/L (ref 3.5–5.2)
SODIUM: 141 mmol/L (ref 134–144)
Total Protein: 6.8 g/dL (ref 6.0–8.5)

## 2015-08-05 LAB — LIPID PANEL
Chol/HDL Ratio: 4.4 ratio units (ref 0.0–4.4)
Cholesterol, Total: 217 mg/dL — ABNORMAL HIGH (ref 100–199)
HDL: 49 mg/dL (ref >39)
LDL Calculated: 150 mg/dL — ABNORMAL HIGH (ref 0–99)
TRIGLYCERIDES: 92 mg/dL (ref 0–149)
VLDL Cholesterol Cal: 18 mg/dL (ref 5–40)

## 2015-08-05 LAB — URIC ACID: Uric Acid: 9.2 mg/dL — ABNORMAL HIGH (ref 2.5–7.1)

## 2015-09-15 ENCOUNTER — Other Ambulatory Visit: Payer: Self-pay | Admitting: Family Medicine

## 2015-09-24 ENCOUNTER — Encounter: Payer: Self-pay | Admitting: Family Medicine

## 2015-09-24 ENCOUNTER — Ambulatory Visit (INDEPENDENT_AMBULATORY_CARE_PROVIDER_SITE_OTHER): Payer: Medicare Other | Admitting: Family Medicine

## 2015-09-24 VITALS — BP 120/71 | HR 89 | Temp 97.7°F | Resp 16 | Ht 60.0 in | Wt 145.4 lb

## 2015-09-24 DIAGNOSIS — L301 Dyshidrosis [pompholyx]: Secondary | ICD-10-CM | POA: Insufficient documentation

## 2015-09-24 MED ORDER — TRIAMCINOLONE ACETONIDE 0.5 % EX CREA: 1.0000 "application " | TOPICAL_CREAM | Freq: Three times a day (TID) | CUTANEOUS | Status: DC

## 2015-09-24 NOTE — Telephone Encounter (Signed)
Her last name is not my section of the alphabet; please forward to appropriate provider; thanks

## 2015-09-24 NOTE — Progress Notes (Signed)
Name: Julia Herrera   MRN: [id number]    DOB: February 29, 1926   Date:09/24/2015       Progress Note  Subjective  Chief Complaint  Chief Complaint  Patient presents with  . Arm Pain    right    Rash This is a new problem. The current episode started more than 1 month ago. The affected locations include the right arm. The rash is characterized by itchiness and dryness. She was exposed to nothing. Pertinent negatives include no cough, fatigue or fever. Past treatments include nothing. The treatment provided moderate relief.    Past Medical History  Diagnosis Date  . Hyperlipidemia   . Osteoporosis   . Chronic kidney disease   . Hypertension     Past Surgical History  Procedure Laterality Date  . Cholecystectomy      History reviewed. No pertinent family history.  Social History   Social History  . Marital Status: Single    Spouse Name: N/A  . Number of Children: N/A  . Years of Education: N/A   Occupational History  . Not on file.   Social History Main Topics  . Smoking status: Former Research scientist (life sciences)  . Smokeless tobacco: Current User    Types: Snuff  . Alcohol Use: No     Comment: Former  . Drug Use: No  . Sexual Activity: Not on file   Other Topics Concern  . Not on file   Social History Narrative     Current outpatient prescriptions:  .  allopurinol (ZYLOPRIM) 100 MG tablet, Take 1 tablet (100 mg total) by mouth daily., Disp: 90 tablet, Rfl: 1 .  amLODipine (NORVASC) 5 MG tablet, Take 1 tablet (5 mg total) by mouth daily., Disp: 90 tablet, Rfl: 1 .  doxepin (SINEQUAN) 25 MG capsule, , Disp: , Rfl:  .  hydrOXYzine (ATARAX/VISTARIL) 25 MG tablet, Take 1 tablet (25 mg total) by mouth 3 (three) times daily as needed., Disp: 30 tablet, Rfl: 0 .  mometasone (ELOCON) 0.1 % cream, Apply 1 application topically daily., Disp: 15 g, Rfl: 0 .  omeprazole (PRILOSEC) 20 MG capsule, TAKE ONE (1) CAPSULE EACH DAY, Disp: 30 capsule, Rfl: 1 .  potassium chloride SA (KLOR-CON M20) 20 MEQ  tablet, KLOR-CON M20 ORAL TABLET CONTROLLED RELEASE 20 MEQ  1 Every Day for 0 days  Quantity: 30.00;  Refills: 0   Ordered :27-Feb-2008  Ashok Norris MD;  Started 27-Feb-2008 Discontinued Comments: DX: 401.1 helenj (02/27/2008) - [facility], Disp: , Rfl:  .  sulfamethoxazole-trimethoprim (BACTRIM DS,SEPTRA DS) 800-160 MG tablet, Take 1 tablet by mouth 2 (two) times daily., Disp: , Rfl:  .  valsartan-hydrochlorothiazide (DIOVAN-HCT) 160-12.5 MG tablet, TAKE ONE (1) TABLET EACH DAY, Disp: 30 tablet, Rfl: 5 .  ZETIA 10 MG tablet, TAKE 1 TABLET BY MOUTH EVERY DAY., Disp: 30 tablet, Rfl: 5  Allergies  Allergen Reactions  . Allopurinol Other (See Comments)    dizziness  . Fosamax [Alendronate] Nausea Only  . Niaspan [Niacin Er] Other (See Comments)    unknown  . Zocor [Simvastatin] Other (See Comments)    Muscle pain     Review of Systems  Constitutional: Negative for fever, chills, malaise/fatigue and fatigue.  Respiratory: Negative for cough.   Skin: Positive for itching and rash.    Objective  Filed Vitals:   09/24/15 1404  BP: 120/71  Pulse: 89  Temp: 97.7 F (36.5 C)  TempSrc: Oral  Resp: 16  Height: 5' (1.524 m)  Weight: 145 lb 6.4 oz (65.953 kg)  SpO2: 96%    Physical Exam  Constitutional: She is well-developed, well-nourished, and in no distress.  HENT:  Head: Normocephalic and atraumatic.  Skin: Skin is warm. Rash noted.  Right forearm with erythematous maculo-papular pruritic rash, mainly over the lateral area.  Psychiatric: Mood, memory, affect and judgment normal.  Nursing note and vitals reviewed.    Assessment & Plan  1. Dyshidrotic dermatitis Advised to use moisturizing cream over the affected area, started on mid to high potency topical corticosteroid therapy. - triamcinolone cream (KENALOG) 0.5 %; Apply 1 application topically 3 (three) times daily.  Dispense: 30 g; Refill: 0 .  Afomia Blackley Asad A.  [facility] 09/24/2015 2:22 PM

## 2015-10-06 DIAGNOSIS — N183 Chronic kidney disease, stage 3 (moderate): Secondary | ICD-10-CM | POA: Diagnosis not present

## 2015-10-06 DIAGNOSIS — I129 Hypertensive chronic kidney disease with stage 1 through stage 4 chronic kidney disease, or unspecified chronic kidney disease: Secondary | ICD-10-CM | POA: Diagnosis not present

## 2015-10-06 DIAGNOSIS — I10 Essential (primary) hypertension: Secondary | ICD-10-CM | POA: Diagnosis not present

## 2015-10-07 DIAGNOSIS — H401132 Primary open-angle glaucoma, bilateral, moderate stage: Secondary | ICD-10-CM | POA: Diagnosis not present

## 2015-11-04 ENCOUNTER — Other Ambulatory Visit: Payer: Self-pay

## 2015-11-04 ENCOUNTER — Ambulatory Visit (INDEPENDENT_AMBULATORY_CARE_PROVIDER_SITE_OTHER): Payer: Medicare Other | Admitting: Family Medicine

## 2015-11-04 ENCOUNTER — Encounter: Payer: Self-pay | Admitting: Family Medicine

## 2015-11-04 VITALS — BP 120/84 | HR 100 | Temp 97.2°F | Resp 16 | Ht 60.0 in | Wt 141.2 lb

## 2015-11-04 DIAGNOSIS — E785 Hyperlipidemia, unspecified: Secondary | ICD-10-CM

## 2015-11-04 DIAGNOSIS — M810 Age-related osteoporosis without current pathological fracture: Secondary | ICD-10-CM

## 2015-11-04 DIAGNOSIS — K2100 Gastro-esophageal reflux disease with esophagitis, without bleeding: Secondary | ICD-10-CM

## 2015-11-04 MED ORDER — OMEPRAZOLE 20 MG PO CPDR: 20.0000 mg | DELAYED_RELEASE_CAPSULE | Freq: Every day | ORAL | 1 refills | Status: DC

## 2015-11-04 NOTE — Progress Notes (Signed)
Name: Julia Herrera   MRN: [id number]    DOB: Sep 15, 1925   Date:11/04/2015       Progress Note  Subjective  Chief Complaint  Chief Complaint  Patient presents with  . Osteoporosis    per insurance company. Patient need Bone density  . Gastroesophageal Reflux    medication refills    Gastroesophageal Reflux  She complains of heartburn. She reports no abdominal pain, no chest pain or no dysphagia. This is a chronic problem. The problem has been unchanged. She has tried a PPI for the symptoms.   Osteoporosis Lumbar Spine: Patient was advised by her insurance company to obtain a bone scan. Her last DEXA scan was in February 2014 and showed Osteoporosis of lumbar spine. She was not started on any medication for Osteoporosis by her then PCP Dr. Rutherford Herrera. She had a fall 2 months ago in her home and hit her head but no bone injuries or fractures.   Past Medical History:  Diagnosis Date  . Chronic kidney disease   . Hyperlipidemia   . Hypertension   . Osteoporosis     Past Surgical History:  Procedure Laterality Date  . CHOLECYSTECTOMY      No family history on file.  Social History   Social History  . Marital status: Single    Spouse name: N/A  . Number of children: N/A  . Years of education: N/A   Occupational History  . Not on file.   Social History Main Topics  . Smoking status: Former Research scientist (life sciences)  . Smokeless tobacco: Current User    Types: Snuff  . Alcohol use No     Comment: Former  . Drug use: No  . Sexual activity: Not on file   Other Topics Concern  . Not on file   Social History Narrative  . No narrative on file     Current Outpatient Prescriptions:  .  allopurinol (ZYLOPRIM) 100 MG tablet, Take 1 tablet (100 mg total) by mouth daily., Disp: 90 tablet, Rfl: 1 .  amLODipine (NORVASC) 5 MG tablet, Take 1 tablet (5 mg total) by mouth daily., Disp: 90 tablet, Rfl: 1 .  doxepin (SINEQUAN) 25 MG capsule, , Disp: , Rfl:  .  hydrOXYzine (ATARAX/VISTARIL) 25 MG  tablet, Take 1 tablet (25 mg total) by mouth 3 (three) times daily as needed., Disp: 30 tablet, Rfl: 0 .  mometasone (ELOCON) 0.1 % cream, Apply 1 application topically daily., Disp: 15 g, Rfl: 0 .  omeprazole (PRILOSEC) 20 MG capsule, TAKE 1 CAPSULE BY MOUTH EVERY DAY., Disp: 30 capsule, Rfl: 1 .  potassium chloride SA (KLOR-CON M20) 20 MEQ tablet, KLOR-CON M20 ORAL TABLET CONTROLLED RELEASE 20 MEQ  1 Every Day for 0 days  Quantity: 30.00;  Refills: 0   Ordered :27-Feb-2008  Julia Norris MD;  Started 27-Feb-2008 Discontinued Comments: DX: 401.1 helenj (02/27/2008) - [facility], Disp: , Rfl:  .  valsartan-hydrochlorothiazide (DIOVAN-HCT) 160-12.5 MG tablet, TAKE ONE (1) TABLET EACH DAY, Disp: 30 tablet, Rfl: 5 .  ZETIA 10 MG tablet, TAKE 1 TABLET BY MOUTH EVERY DAY., Disp: 30 tablet, Rfl: 5  Allergies  Allergen Reactions  . Allopurinol Other (See Comments)    dizziness  . Fosamax [Alendronate] Nausea Only  . Niaspan [Niacin Er] Other (See Comments)    unknown  . Zocor [Simvastatin] Other (See Comments)    Muscle pain     Review of Systems  Constitutional: Negative for chills  and fever.  Cardiovascular: Negative for chest pain.  Gastrointestinal: Positive for heartburn. Negative for abdominal pain and dysphagia.  Musculoskeletal: Negative for back pain.     Objective  Vitals:   11/04/15 1416  BP: 120/84  Pulse: 100  Resp: 16  Temp: 97.2 F (36.2 C)  TempSrc: Oral  SpO2: 94%  Weight: 141 lb 3.2 oz (64 kg)  Height: 5' (1.524 m)    Physical Exam  Constitutional: She is well-developed, well-nourished, and in no distress.  Cardiovascular: Normal rate, regular rhythm, S1 normal, S2 normal and normal heart sounds.   Pulmonary/Chest: No respiratory distress. She has no decreased breath sounds. She has no wheezes.  Abdominal: Soft. Bowel sounds are normal. There is no tenderness.  Musculoskeletal:       Right ankle: She  exhibits no swelling.       Left ankle: She exhibits no swelling.       Lumbar back: She exhibits no bony tenderness.  Nursing note and vitals reviewed.    Assessment & Plan  1. Gastroesophageal reflux disease with esophagitis  on chronic PPI therapy, no worrisome symptoms. Continue management  - omeprazole (PRILOSEC) 20 MG capsule; Take 1 capsule (20 mg total) by mouth daily.  Dispense: 90 capsule; Refill: 1  2. Osteoporosis History of lumbar spine osteoporosis, never on bisphosphonate therapy. - DG Bone Density; Future - Vitamin D (25 hydroxy)  3. Hyperlipidemia  - Lipid Profile - COMPLETE METABOLIC PANEL WITH GFR  Julia Herrera Julia A. [facility] 11/04/2015 2:28 PM

## 2015-11-05 LAB — COMPLETE METABOLIC PANEL WITH GFR
ALT: 7 U/L (ref 6–29)
AST: 12 U/L (ref 10–35)
Albumin: 3.9 g/dL (ref 3.6–5.1)
Alkaline Phosphatase: 51 U/L (ref 33–130)
BUN: 12 mg/dL (ref 7–25)
CHLORIDE: 103 mmol/L (ref 98–110)
CO2: 27 mmol/L (ref 20–31)
Calcium: 9.9 mg/dL (ref 8.6–10.4)
Creat: 1.66 mg/dL — ABNORMAL HIGH (ref 0.60–0.88)
GFR, EST AFRICAN AMERICAN: 32 mL/min — AB (ref >=60)
GFR, EST NON AFRICAN AMERICAN: 28 mL/min — AB (ref >=60)
GLUCOSE: 87 mg/dL (ref 65–99)
POTASSIUM: 4 mmol/L (ref 3.5–5.3)
SODIUM: 141 mmol/L (ref 135–146)
TOTAL PROTEIN: 6.7 g/dL (ref 6.1–8.1)
Total Bilirubin: 1 mg/dL (ref 0.2–1.2)

## 2015-11-05 LAB — LIPID PANEL
CHOL/HDL RATIO: 3.7 ratio (ref <=5.0)
CHOLESTEROL: 223 mg/dL — AB (ref 125–200)
HDL: 60 mg/dL (ref >=46)
LDL Cholesterol: 144 mg/dL — ABNORMAL HIGH (ref <130)
TRIGLYCERIDES: 93 mg/dL (ref <150)
VLDL: 19 mg/dL (ref <30)

## 2015-11-05 LAB — VITAMIN D 25 HYDROXY (VIT D DEFICIENCY, FRACTURES): Vit D, 25-Hydroxy: 15 ng/mL — ABNORMAL LOW (ref 30–100)

## 2015-11-12 ENCOUNTER — Telehealth: Payer: Self-pay

## 2015-11-12 MED ORDER — VITAMIN D (ERGOCALCIFEROL) 1.25 MG (50000 UNIT) PO CAPS: 50000.0000 [IU] | ORAL_CAPSULE | ORAL | 0 refills | Status: DC

## 2015-11-12 NOTE — Telephone Encounter (Signed)
Patient has been notified of lab results and a prescription for vitamin D3 50,000 units once a week for 12 weeks has been sent to [facility] per Dr. Manuella Ghazi, pt has been notified and verbalized understanding and has scheduled appointment for 11/20/2015

## 2015-11-20 ENCOUNTER — Ambulatory Visit: Payer: Self-pay | Admitting: Family Medicine

## 2015-11-21 ENCOUNTER — Other Ambulatory Visit: Payer: Self-pay | Admitting: Family Medicine

## 2015-11-24 ENCOUNTER — Encounter: Payer: Self-pay | Admitting: Family Medicine

## 2015-11-24 ENCOUNTER — Ambulatory Visit (INDEPENDENT_AMBULATORY_CARE_PROVIDER_SITE_OTHER): Payer: Medicare Other | Admitting: Family Medicine

## 2015-11-24 VITALS — BP 118/72 | HR 88 | Temp 97.2°F | Resp 16 | Ht 60.0 in | Wt 140.1 lb

## 2015-11-24 DIAGNOSIS — M1A4710 Other secondary chronic gout, right ankle and foot, without tophus (tophi): Secondary | ICD-10-CM | POA: Diagnosis not present

## 2015-11-24 DIAGNOSIS — E785 Hyperlipidemia, unspecified: Secondary | ICD-10-CM

## 2015-11-24 DIAGNOSIS — L301 Dyshidrosis [pompholyx]: Secondary | ICD-10-CM

## 2015-11-24 DIAGNOSIS — I10 Essential (primary) hypertension: Secondary | ICD-10-CM

## 2015-11-24 LAB — URIC ACID: URIC ACID, SERUM: 7 mg/dL (ref 2.5–7.0)

## 2015-11-24 MED ORDER — MOMETASONE FUROATE 0.1 % EX CREA: 1.0000 "application " | TOPICAL_CREAM | Freq: Every day | CUTANEOUS | 0 refills | Status: DC

## 2015-11-24 MED ORDER — VALSARTAN-HYDROCHLOROTHIAZIDE 160-12.5 MG PO TABS: 1.0000 | ORAL_TABLET | Freq: Every day | ORAL | 1 refills | Status: DC

## 2015-11-24 MED ORDER — ATORVASTATIN CALCIUM 10 MG PO TABS: 10.0000 mg | ORAL_TABLET | Freq: Every day | ORAL | 1 refills | Status: DC

## 2015-11-24 MED ORDER — ALLOPURINOL 100 MG PO TABS: 100.0000 mg | ORAL_TABLET | Freq: Two times a day (BID) | ORAL | 1 refills | Status: DC

## 2015-11-24 NOTE — Progress Notes (Signed)
Name: Julia Herrera   MRN: [id number]    DOB: 10/03/1925   Date:11/24/2015       Progress Note  Subjective  Chief Complaint  Chief Complaint  Patient presents with  . Follow-up    discuss medication    Hypertension  This is a chronic problem. The problem is unchanged. Pertinent negatives include no blurred vision, chest pain, headaches or palpitations. Past treatments include angiotensin blockers, diuretics and calcium channel blockers. There is no history of kidney disease, CAD/MI or CVA.  Rash  This is a chronic problem. The affected locations include the abdomen and groin. The rash is characterized by dryness, itchiness and scaling. Pertinent negatives include no congestion, fatigue or fever. Past treatments include topical steroids.    Past Medical History:  Diagnosis Date  . Chronic kidney disease   . Hyperlipidemia   . Hypertension   . Osteoporosis     Past Surgical History:  Procedure Laterality Date  . CHOLECYSTECTOMY      No family history on file.  Social History   Social History  . Marital status: Single    Spouse name: N/A  . Number of children: N/A  . Years of education: N/A   Occupational History  . Not on file.   Social History Main Topics  . Smoking status: Former Research scientist (life sciences)  . Smokeless tobacco: Current User    Types: Snuff  . Alcohol use No     Comment: Former  . Drug use: No  . Sexual activity: Not on file   Other Topics Concern  . Not on file   Social History Narrative  . No narrative on file     Current Outpatient Prescriptions:  .  allopurinol (ZYLOPRIM) 100 MG tablet, Take 1 tablet (100 mg total) by mouth daily., Disp: 90 tablet, Rfl: 1 .  amLODipine (NORVASC) 5 MG tablet, Take 1 tablet (5 mg total) by mouth daily., Disp: 90 tablet, Rfl: 1 .  doxepin (SINEQUAN) 25 MG capsule, , Disp: , Rfl:  .  hydrOXYzine (ATARAX/VISTARIL) 25 MG tablet, Take 1 tablet (25 mg total) by mouth 3 (three) times daily as needed., Disp: 30 tablet, Rfl: 0 .   mometasone (ELOCON) 0.1 % cream, Apply 1 application topically daily., Disp: 15 g, Rfl: 0 .  omeprazole (PRILOSEC) 20 MG capsule, Take 1 capsule (20 mg total) by mouth daily., Disp: 90 capsule, Rfl: 1 .  potassium chloride SA (KLOR-CON M20) 20 MEQ tablet, KLOR-CON M20 ORAL TABLET CONTROLLED RELEASE 20 MEQ  1 Every Day for 0 days  Quantity: 30.00;  Refills: 0   Ordered :27-Feb-2008  Julia Norris MD;  Started 27-Feb-2008 Discontinued Comments: DX: 401.1 helenj (02/27/2008) - [facility], Disp: , Rfl:  .  valsartan-hydrochlorothiazide (DIOVAN-HCT) 160-12.5 MG tablet, TAKE ONE (1) TABLET EACH DAY, Disp: 30 tablet, Rfl: 5 .  Vitamin D, Ergocalciferol, (DRISDOL) 50000 units CAPS capsule, Take 1 capsule (50,000 Units total) by mouth once a week. FOR 12 WEEKS, Disp: 12 capsule, Rfl: 0 .  ZETIA 10 MG tablet, TAKE 1 TABLET BY MOUTH EVERY DAY., Disp: 30 tablet, Rfl: 5  Allergies  Allergen Reactions  . Allopurinol Other (See Comments)    dizziness  . Fosamax [Alendronate] Nausea Only  . Niaspan [Niacin Er] Other (See Comments)    unknown  . Zocor [Simvastatin] Other (See Comments)    Muscle pain     Review of Systems  Constitutional: Negative for fatigue and fever.  HENT:  Negative for congestion.   Eyes: Negative for blurred vision.  Cardiovascular: Negative for chest pain and palpitations.  Skin: Positive for rash.  Neurological: Negative for headaches.     Objective  Vitals:   11/24/15 1004  BP: 118/72  Pulse: 88  Resp: 16  Temp: 97.2 F (36.2 C)  TempSrc: Oral  SpO2: 99%  Weight: 140 lb 1.6 oz (63.5 kg)  Height: 5' (1.524 m)    Physical Exam  Constitutional: She is well-developed, well-nourished, and in no distress.  Cardiovascular: Normal rate, regular rhythm, S1 normal, S2 normal and normal heart sounds.   Pulmonary/Chest: Effort normal and breath sounds normal. She has no wheezes. She has no rhonchi.  Musculoskeletal:        Right ankle: She exhibits no swelling.       Left ankle: She exhibits no swelling.  Skin: Skin is dry. Rash noted.  Pruritic scaly non-erythematous rash on lower abdomen, c/w dermatitis  Psychiatric: Mood, memory, affect and judgment normal.  Nursing note and vitals reviewed.     Assessment & Plan  1. 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  Dispense: 180 tablet; Refill: 1  2. Hyperlipidemia  - atorvastatin (LIPITOR) 10 MG tablet; Take 1 tablet (10 mg total) by mouth daily.  Dispense: 90 tablet; Refill: 1  3. Essential hypertension  - valsartan-hydrochlorothiazide (DIOVAN-HCT) 160-12.5 MG tablet; Take 1 tablet by mouth daily.  Dispense: 90 tablet; Refill: 1  4. Dyshidrotic dermatitis  - mometasone (ELOCON) 0.1 % cream; Apply 1 application topically daily.  Dispense: 15 g; Refill: 0   Julia Herrera Asad A. [facility] 11/24/2015 10:19 AM

## 2015-12-10 IMAGING — CT CT HEAD W/O CM
1 series · 16 of 30 positions shown, 20 images · non-contrast
Comparison: None.

CLINICAL DATA: Syncope

EXAM:
CT HEAD WITHOUT CONTRAST
TECHNIQUE: Contiguous axial images were obtained from the base of the skull
through the vertex without intravenous contrast.

[Series 2: head wo · axial · 0.39mm/px · z∈[-125,+1]mm · 16 of 32 slices shown, 20 images]
[im 2/32  brain]
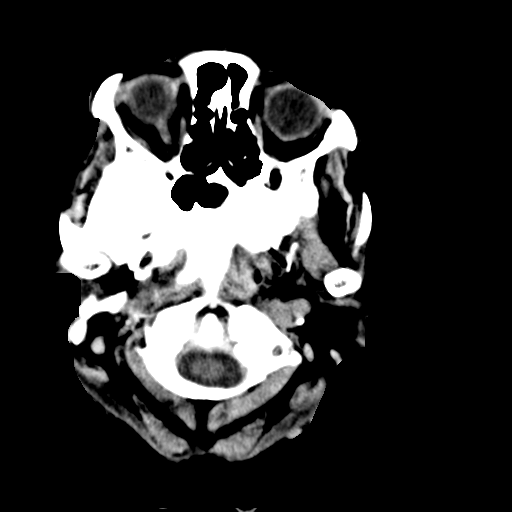
[im 2/32  bone]
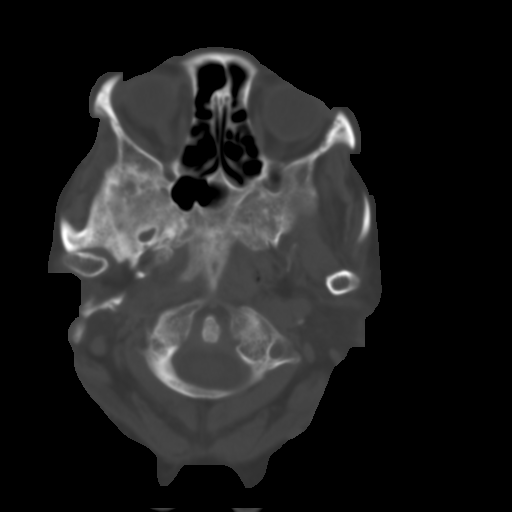
[im 4/32  brain]
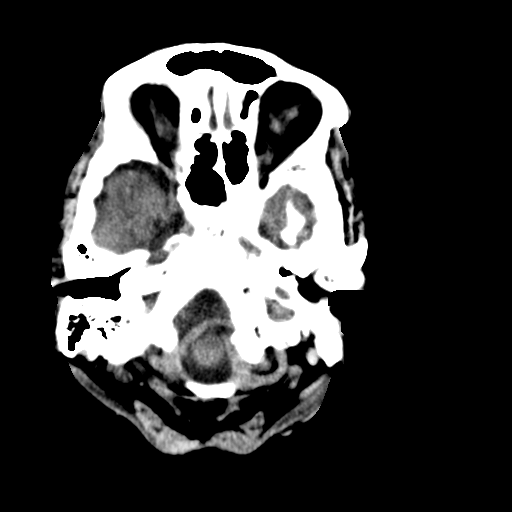
[im 6/32  brain]
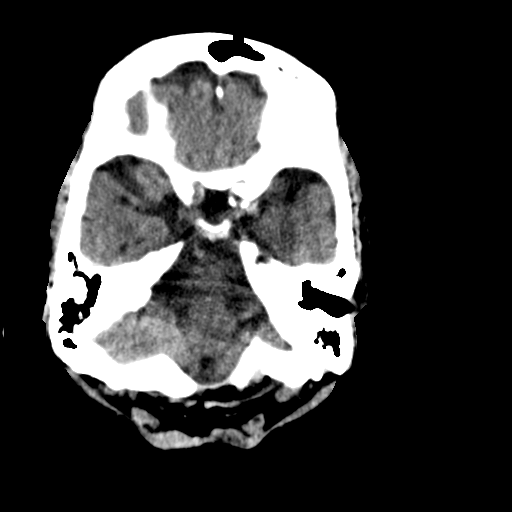
[im 8/32  brain]
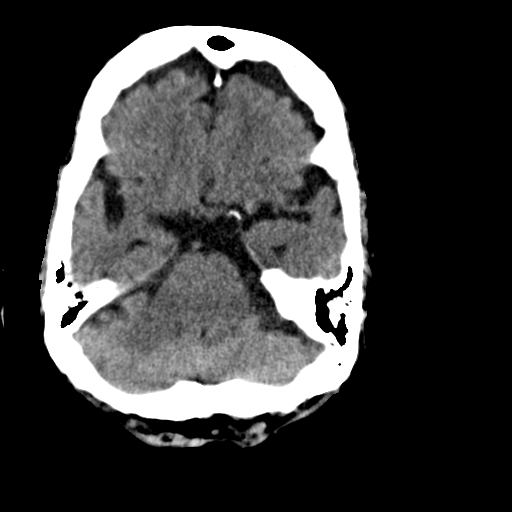
[im 9/32  brain]
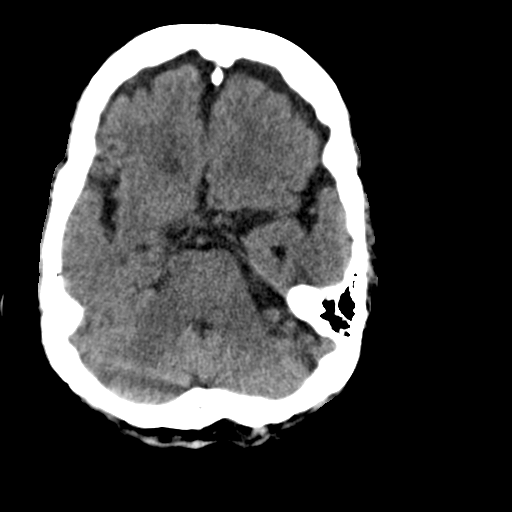
[im 9/32  bone]
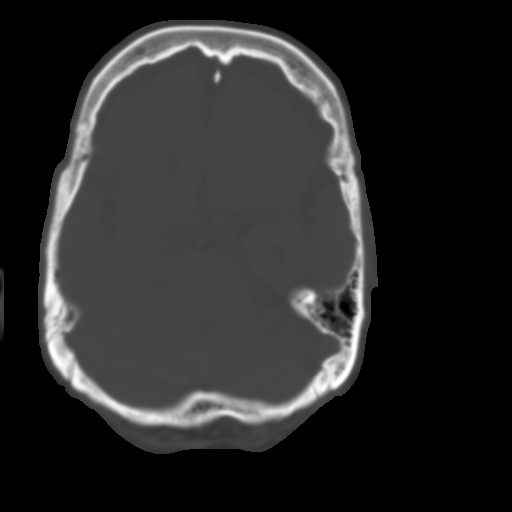
[im 11/32  brain]
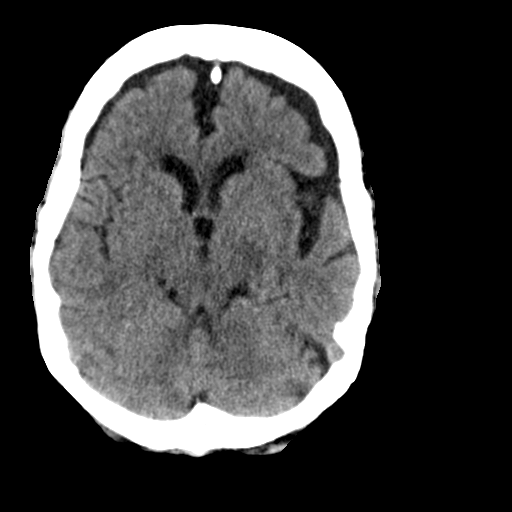
[im 13/32  brain]
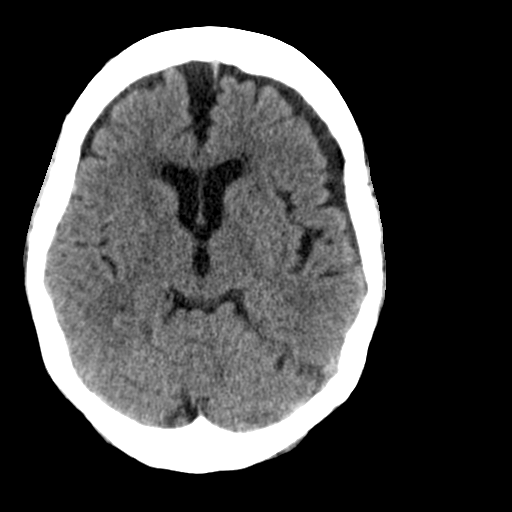
[im 15/32  brain]
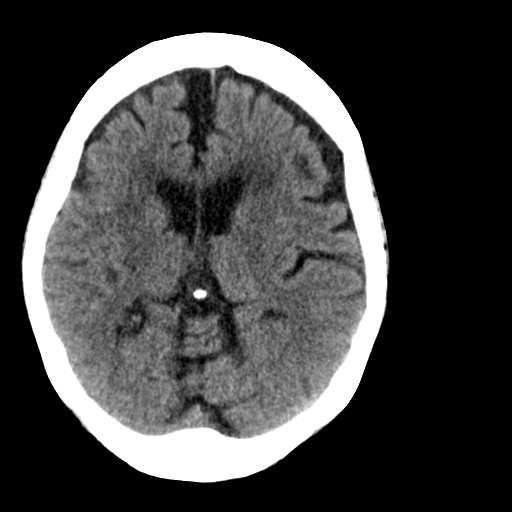
[im 17/32  brain]
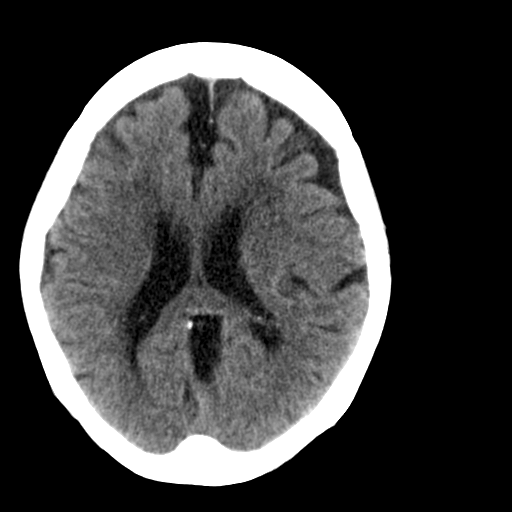
[im 17/32  bone]
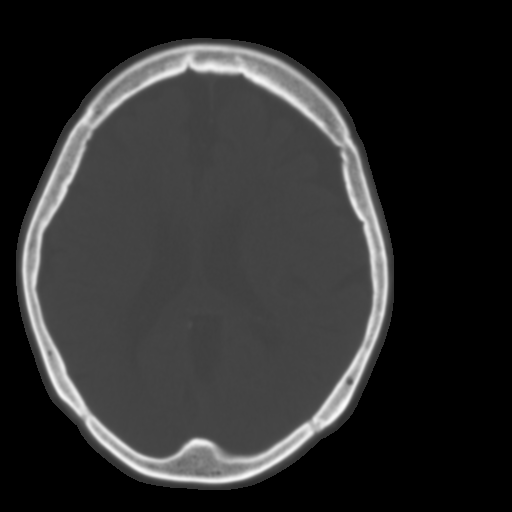
[im 19/32  brain]
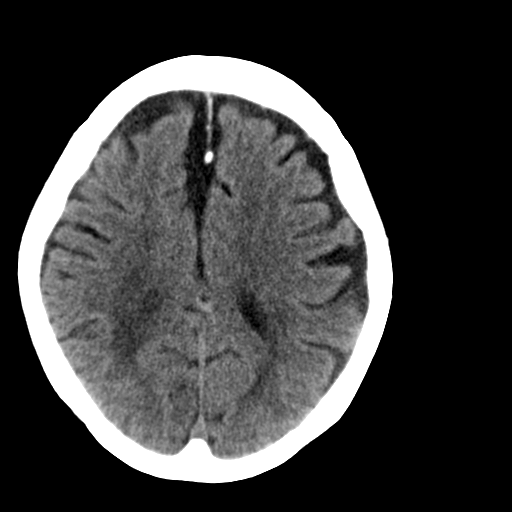
[im 21/32  brain]
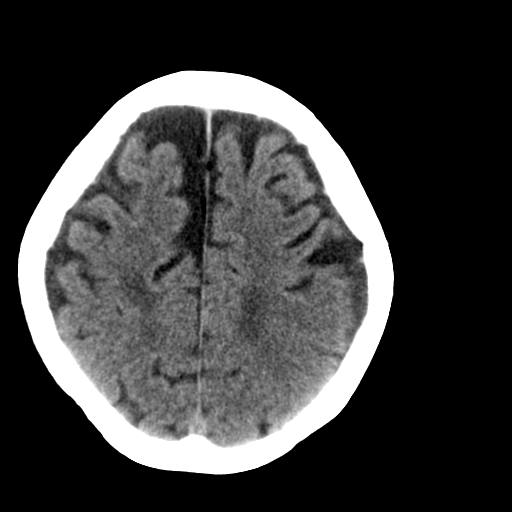
[im 23/32  brain]
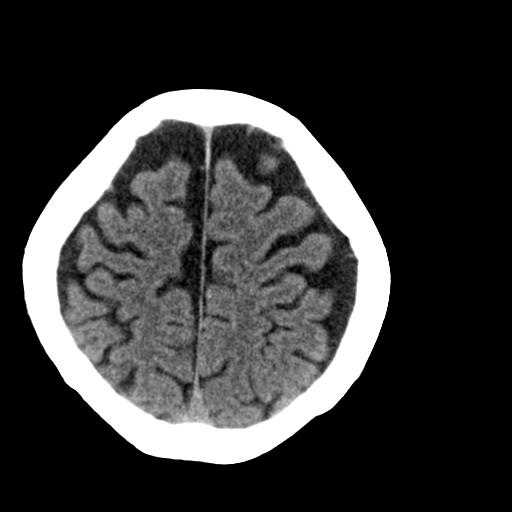
[im 24/32  brain]
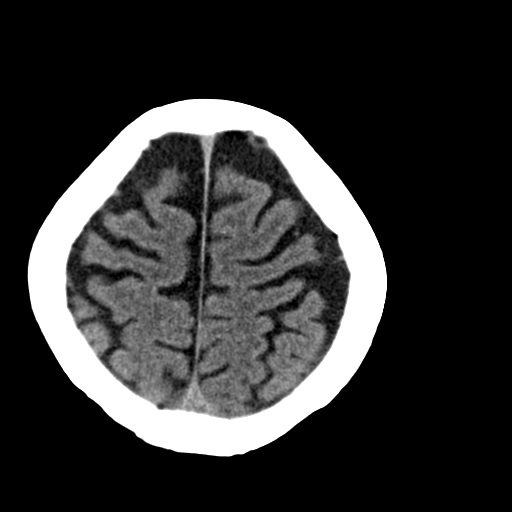
[im 24/32  bone]
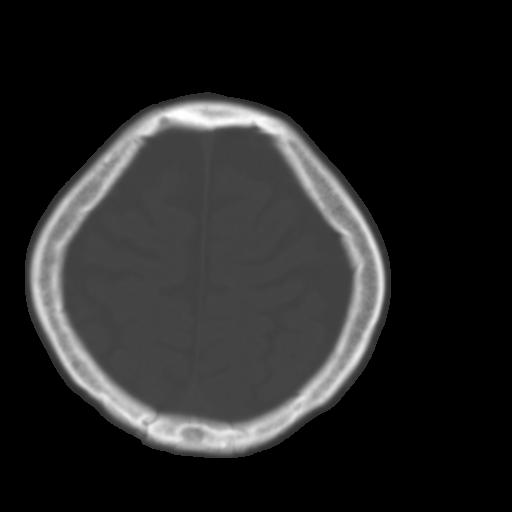
[im 26/32  brain]
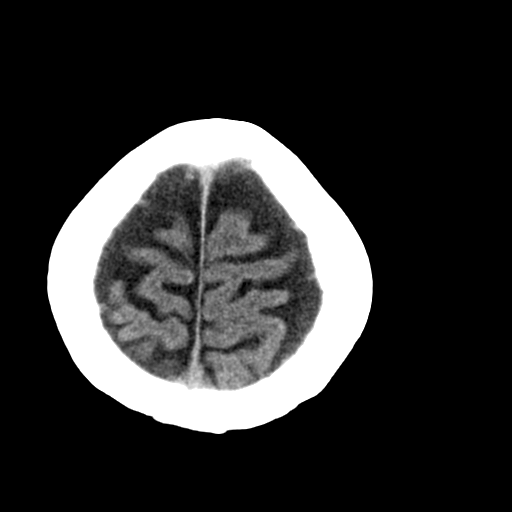
[im 28/32  brain]
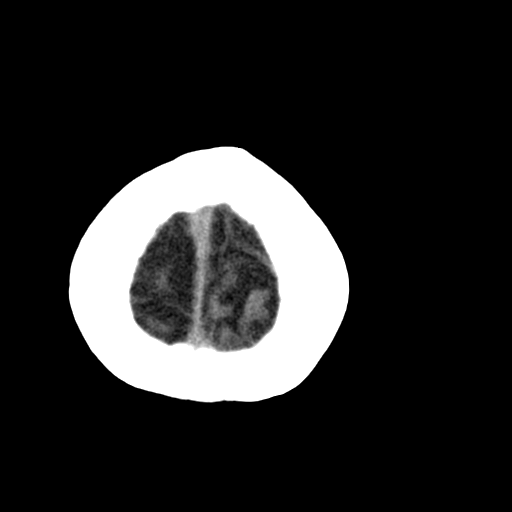
[im 30/32  brain]
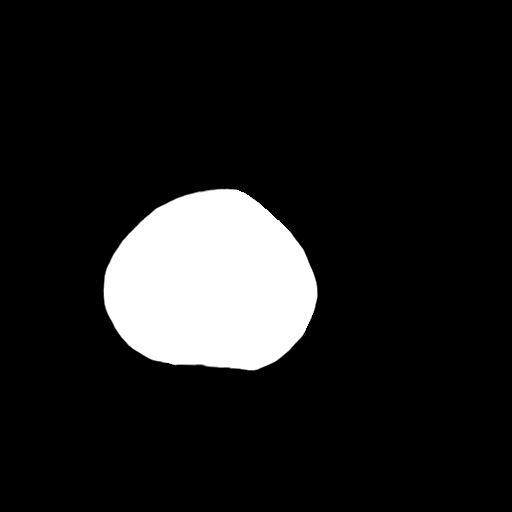

[16 of 30 positions shown; findings below may reference images not displayed]

FINDINGS: No evidence of parenchymal hemorrhage or extra-axial fluid
collection. No mass lesion, mass effect, or midline shift.

No CT evidence of acute infarction.

Subcortical white matter and periventricular small vessel ischemic
changes. Intracranial atherosclerosis

Global cortical atrophy.  No ventriculomegaly.

The visualized paranasal sinuses are essentially clear. The mastoid
air cells are unopacified.

No evidence of calvarial fracture.
IMPRESSION: No evidence of acute intracranial abnormality.

Atrophy with small vessel ischemic changes.

## 2016-02-23 ENCOUNTER — Ambulatory Visit: Payer: Medicare Other | Admitting: Family Medicine

## 2016-02-23 ENCOUNTER — Ambulatory Visit (INDEPENDENT_AMBULATORY_CARE_PROVIDER_SITE_OTHER): Payer: Medicare Other

## 2016-02-23 VITALS — BP 108/79 | HR 60 | Temp 99.0°F | Ht 59.5 in | Wt 145.0 lb

## 2016-02-23 DIAGNOSIS — E785 Hyperlipidemia, unspecified: Secondary | ICD-10-CM | POA: Diagnosis not present

## 2016-02-23 DIAGNOSIS — I10 Essential (primary) hypertension: Secondary | ICD-10-CM | POA: Diagnosis not present

## 2016-02-23 DIAGNOSIS — T502X5A Adverse effect of carbonic-anhydrase inhibitors, benzothiadiazides and other diuretics, initial encounter: Secondary | ICD-10-CM

## 2016-02-23 DIAGNOSIS — Z0000 Encounter for general adult medical examination without abnormal findings: Secondary | ICD-10-CM

## 2016-02-23 DIAGNOSIS — M1A4710 Other secondary chronic gout, right ankle and foot, without tophus (tophi): Secondary | ICD-10-CM

## 2016-02-23 DIAGNOSIS — E876 Hypokalemia: Secondary | ICD-10-CM

## 2016-02-23 DIAGNOSIS — Z23 Encounter for immunization: Secondary | ICD-10-CM | POA: Diagnosis not present

## 2016-02-23 MED ORDER — POTASSIUM CHLORIDE ER 10 MEQ PO TBCR: 10.0000 meq | EXTENDED_RELEASE_TABLET | Freq: Every day | ORAL | 0 refills | Status: DC

## 2016-02-23 NOTE — Progress Notes (Signed)
Subjective:   Julia Herrera is a 82 y.o. female who presents for Medicare Annual (Subsequent) preventive examination.  Review of Systems:  N/A  Cardiac Risk Factors include: advanced age (>70men, >73 women);dyslipidemia;hypertension     Objective:     Vitals: BP 108/79 (BP Location: Left Arm)   Pulse 60   Temp 99 F (37.2 C) (Oral)   Ht 4' 11.5" (1.511 m)   Wt 145 lb (65.8 kg)   BMI 28.80 kg/m   Body mass index is 28.8 kg/m.   Tobacco History  Smoking Status  . Never Smoker  Smokeless Tobacco  . Current User  . Types: Snuff     Ready to quit: Not Answered Counseling given: Not Answered   Past Medical History:  Diagnosis Date  . Chronic kidney disease   . Gout   . Hyperlipidemia   . Hypertension   . Osteoporosis    Past Surgical History:  Procedure Laterality Date  . CHOLECYSTECTOMY     History reviewed. No pertinent family history. History  Sexual Activity  . Sexual activity: Not on file    Outpatient Encounter Prescriptions as of 02/23/2016  Medication Sig  . allopurinol (ZYLOPRIM) 100 MG tablet Take 1 tablet (100 mg total) by mouth 2 (two) times daily.  Marland Kitchen amLODipine (NORVASC) 5 MG tablet Take 1 tablet (5 mg total) by mouth daily.  Marland Kitchen atorvastatin (LIPITOR) 10 MG tablet Take 1 tablet (10 mg total) by mouth daily.  . hydrOXYzine (ATARAX/VISTARIL) 25 MG tablet Take 1 tablet (25 mg total) by mouth 3 (three) times daily as needed.  . mometasone (ELOCON) 0.1 % cream Apply 1 application topically daily.  Marland Kitchen omeprazole (PRILOSEC) 20 MG capsule Take 1 capsule (20 mg total) by mouth daily.  . potassium chloride SA (KLOR-CON M20) 20 MEQ tablet KLOR-CON M20 ORAL TABLET CONTROLLED RELEASE 20 MEQ  1 Every Day for 0 days  Quantity: 30.00;  Refills: 0   Ordered :27-Feb-2008  Ashok Norris MD;  Started 27-Feb-2008 Discontinued Comments: DX: 401.1 helenj (02/27/2008) - [facility]  .  valsartan-hydrochlorothiazide (DIOVAN-HCT) 160-12.5 MG tablet Take 1 tablet by mouth daily.  . Vitamin D, Ergocalciferol, (DRISDOL) 50000 units CAPS capsule Take 1 capsule (50,000 Units total) by mouth once a week. FOR 12 WEEKS  . ZETIA 10 MG tablet TAKE 1 TABLET BY MOUTH EVERY DAY.  Marland Kitchen doxepin (SINEQUAN) 25 MG capsule    No facility-administered encounter medications on file as of 02/23/2016.     Activities of Daily Living In your present state of health, do you have any difficulty performing the following activities: 02/23/2016 09/24/2015  Hearing? N N  Vision? N Y  Difficulty concentrating or making decisions? N N  Walking or climbing stairs? N N  Dressing or bathing? N N  Doing errands, shopping? Y N  Preparing Food and eating ? N -  Using the Toilet? N -  In the past six months, have you accidently leaked urine? N -  Do you have problems with loss of bowel control? N -  Managing your Medications? Y -  Managing your Finances? N -  Housekeeping or managing your Housekeeping? N -  Some recent data might be hidden    Patient Care Team: Roselee Nova, MD as PCP - General (Family Medicine) Estill Cotta, MD as Consulting Physician (Ophthalmology)    Assessment:     Exercise Activities and Dietary recommendations Current Exercise Habits: Home exercise routine,  Type of exercise: strength training/weights, Time (Minutes): 20, Frequency (Times/Week): 7, Weekly Exercise (Minutes/Week): 140, Intensity: Mild  Goals    . Increase water intake          Starting 02/23/16, I will increase my water intake to 4 glasses a day.      Fall Risk Fall Risk  02/23/2016 09/24/2015 08/04/2015 07/17/2015 12/23/2014  Falls in the past year? No No No No No   Depression Screen PHQ 2/9 Scores 02/23/2016 09/24/2015 08/04/2015 07/17/2015  PHQ - 2 Score 1 0 0 0     Cognitive Function     6CIT Screen 02/23/2016  What Year? 4 points  What month? 3 points  What time? 0 points  Count back from  20 4 points  Months in reverse 4 points  Repeat phrase 6 points  Total Score 21    Immunization History  Administered Date(s) Administered  . Influenza, High Dose Seasonal PF 12/23/2014, 02/23/2016  . Pneumococcal-Unspecified 09/14/2011  . Tdap 09/14/2011   Screening Tests Health Maintenance  Topic Date Due  . ZOSTAVAX  02/22/2017 (Originally 11/10/1987)  . PNA vac Low Risk Adult (2 of 2 - PCV13) 02/22/2017 (Originally 09/13/2012)  . TETANUS/TDAP  09/13/2021  . INFLUENZA VACCINE  Completed  . DEXA SCAN  Completed      Plan:  I have personally reviewed and addressed the Medicare Annual Wellness questionnaire and have noted the following in the patient's chart:  A. Medical and social history B. Use of alcohol, tobacco or illicit drugs  C. Current medications and supplements D. Functional ability and status E.  Nutritional status F.  Physical activity G. Advance directives H. List of other physicians I.  Hospitalizations, surgeries, and ER visits in previous 12 months J.  [place] such as hearing and vision if needed, cognitive and depression L. Referrals and appointments - none  In addition, I have reviewed and discussed with patient certain preventive protocols, quality metrics, and best practice recommendations. A written personalized care plan for preventive services as well as general preventive health recommendations were provided to patient.  See attached scanned questionnaire for additional information.   Signed,  Fabio Neighbors, LPN Nurse Health Advisor   MD Recommendations: Follow up on Zostavax and Pneumonia vaccines- pt declined today.  I, as supervising physician, have reviewed the nurse health advisor's Medicare Wellness Visit note for this patient and concur with the findings and recommendations listed above.  Signed Syed Asad A. Manuella Ghazi MD Attending Physician.

## 2016-02-23 NOTE — Patient Instructions (Signed)
Julia Herrera , Thank you for taking time to come for your Medicare Wellness Visit. I appreciate your ongoing commitment to your health goals. Please review the following plan we discussed and let me know if I can assist you in the future.   These are the goals we discussed: Goals    . Increase water intake          Starting 02/23/16, I will increase my water intake to 4 glasses a day.       This is a list of the screening recommended for you and due dates:  Health Maintenance  Topic Date Due  . Shingles Vaccine  11/10/1987  . Pneumonia vaccines (2 of 2 - PCV13) 09/13/2012  . Flu Shot  10/14/2015  . Tetanus Vaccine  09/13/2021  . DEXA scan (bone density measurement)  Completed   Preventive Care for Adults  A healthy lifestyle and preventive care can promote health and wellness. Preventive health guidelines for adults include the following key practices.  . A routine yearly physical is a good way to check with your health care provider about your health and preventive screening. It is a chance to share any concerns and updates on your health and to receive a thorough exam.  . Visit your dentist for a routine exam and preventive care every 6 months. Brush your teeth twice a day and floss once a day. Good oral hygiene prevents tooth decay and gum disease.  . The frequency of eye exams is based on your age, health, family medical history, use  of contact lenses, and other factors. Follow your health care provider's ecommendations for frequency of eye exams.  . Eat a healthy diet. Foods like vegetables, fruits, whole grains, low-fat dairy products, and lean protein foods contain the nutrients you need without too many calories. Decrease your intake of foods high in solid fats, added sugars, and salt. Eat the right amount of calories for you. Get information about a proper diet from your health care provider, if necessary.  . Regular physical exercise is one of the most important things you  can do for your health. Most adults should get at least 150 minutes of moderate-intensity exercise (any activity that increases your heart rate and causes you to sweat) each week. In addition, most adults need muscle-strengthening exercises on 2 or more days a week.  Silver Sneakers may be a benefit available to you. To determine eligibility, you may visit the website: www.silversneakers.com or contact program at 802-227-2709 Mon-Fri between 8AM-8PM.   . Maintain a healthy weight. The body mass index (BMI) is a screening tool to identify possible weight problems. It provides an estimate of body fat based on height and weight. Your health care provider can find your BMI and can help you achieve or maintain a healthy weight.   For adults 20 years and older: ? A BMI below 18.5 is considered underweight. ? A BMI of 18.5 to 24.9 is normal. ? A BMI of 25 to 29.9 is considered overweight. ? A BMI of 30 and above is considered obese.   . Maintain normal blood lipids and cholesterol levels by exercising and minimizing your intake of saturated fat. Eat a balanced diet with plenty of fruit and vegetables. Blood tests for lipids and cholesterol should begin at age 23 and be repeated every 5 years. If your lipid or cholesterol levels are high, you are over 50, or you are at high risk for heart disease, you may need your cholesterol levels  checked more frequently. Ongoing high lipid and cholesterol levels should be treated with medicines if diet and exercise are not working.  . If you smoke, find out from your health care provider how to quit. If you do not use tobacco, please do not start.  . If you choose to drink alcohol, please do not consume more than 2 drinks per day. One drink is considered to be 12 ounces (355 mL) of beer, 5 ounces (148 mL) of wine, or 1.5 ounces (44 mL) of liquor.  . If you are 71-50 years old, ask your health care provider if you should take aspirin to prevent strokes.  . Use  sunscreen. Apply sunscreen liberally and repeatedly throughout the day. You should seek shade when your shadow is shorter than you. Protect yourself by wearing long sleeves, pants, a wide-brimmed hat, and sunglasses year round, whenever you are outdoors.  . Once a month, do a whole body skin exam, using a mirror to look at the skin on your back. Tell your health care provider of new moles, moles that have irregular borders, moles that are larger than a pencil eraser, or moles that have changed in shape or color.

## 2016-02-23 NOTE — Progress Notes (Signed)
Name: Julia Herrera   MRN: [id number]    DOB: Oct 31, 1925   Date:02/25/2016       Progress Note  Subjective  Chief Complaint  No chief complaint on file.   Hypertension  This is a chronic problem. The problem is unchanged. The problem is controlled. Pertinent negatives include no blurred vision, chest pain, headaches, palpitations or shortness of breath. Past treatments include calcium channel blockers, angiotensin blockers and diuretics. There is no history of kidney disease, CAD/MI or CVA.  Hyperlipidemia  This is a chronic problem. The problem is uncontrolled. Recent lipid tests were reviewed and are high. Pertinent negatives include no chest pain or shortness of breath. Current antihyperlipidemic treatment includes statins.     Past Medical History:  Diagnosis Date  . Chronic kidney disease   . Gout   . Hyperlipidemia   . Hypertension   . Osteoporosis     Past Surgical History:  Procedure Laterality Date  . CHOLECYSTECTOMY      No family history on file.  Social History   Social History  . Marital status: Single    Spouse name: N/A  . Number of children: N/A  . Years of education: N/A   Occupational History  . Not on file.   Social History Main Topics  . Smoking status: Never Smoker  . Smokeless tobacco: Current User    Types: Snuff  . Alcohol use No     Comment: Former  . Drug use: No  . Sexual activity: Not on file   Other Topics Concern  . Not on file   Social History Narrative  . No narrative on file     Current Outpatient Prescriptions:  .  allopurinol (ZYLOPRIM) 100 MG tablet, Take 1 tablet (100 mg total) by mouth 2 (two) times daily., Disp: 180 tablet, Rfl: 1 .  amLODipine (NORVASC) 5 MG tablet, Take 1 tablet (5 mg total) by mouth daily., Disp: 90 tablet, Rfl: 1 .  atorvastatin (LIPITOR) 10 MG tablet, Take 1 tablet (10 mg total) by mouth daily., Disp: 90 tablet, Rfl: 1 .  doxepin (SINEQUAN) 25 MG capsule, , Disp: , Rfl:  .  hydrOXYzine  (ATARAX/VISTARIL) 25 MG tablet, Take 1 tablet (25 mg total) by mouth 3 (three) times daily as needed., Disp: 30 tablet, Rfl: 0 .  mometasone (ELOCON) 0.1 % cream, Apply 1 application topically daily., Disp: 15 g, Rfl: 0 .  omeprazole (PRILOSEC) 20 MG capsule, Take 1 capsule (20 mg total) by mouth daily., Disp: 90 capsule, Rfl: 1 .  potassium chloride (K-DUR) 10 MEQ tablet, Take 1 tablet (10 mEq total) by mouth daily., Disp: 90 tablet, Rfl: 0 .  valsartan-hydrochlorothiazide (DIOVAN-HCT) 160-12.5 MG tablet, Take 1 tablet by mouth daily., Disp: 90 tablet, Rfl: 1 .  Vitamin D, Ergocalciferol, (DRISDOL) 50000 units CAPS capsule, Take 1 capsule (50,000 Units total) by mouth once a week. FOR 12 WEEKS, Disp: 12 capsule, Rfl: 0 .  ZETIA 10 MG tablet, TAKE 1 TABLET BY MOUTH EVERY DAY., Disp: 30 tablet, Rfl: 0  Allergies  Allergen Reactions  . Allopurinol Other (See Comments)    dizziness  . Fosamax [Alendronate] Nausea Only  . Niaspan [Niacin Er] Other (See Comments)    unknown  . Zocor [Simvastatin] Other (See Comments)    Muscle pain     Review of Systems  Eyes: Negative for blurred vision.  Respiratory: Negative for shortness of breath.   Cardiovascular: Negative for chest pain and palpitations.  Neurological: Negative for headaches.  Objective  There were no vitals filed for this visit.  Physical Exam  Constitutional: She is well-developed, well-nourished, and in no distress.  Cardiovascular: Normal rate, regular rhythm, S1 normal, S2 normal and normal heart sounds.   Pulmonary/Chest: No respiratory distress. She has no decreased breath sounds. She has no wheezes.  Abdominal: Soft. Bowel sounds are normal. There is no tenderness.  Musculoskeletal:       Right ankle: She exhibits no swelling.       Left ankle: She exhibits no swelling.       Lumbar back: She exhibits no bony tenderness.  Nursing note and vitals reviewed.      Assessment & Plan  1. Essential  hypertension BP stable and controlled on present antihypertensive therapy  2. Hyperlipidemia, unspecified hyperlipidemia type  - Lipid Profile - COMPLETE METABOLIC PANEL WITH GFR  3. Diuretic-induced hypokalemia  - potassium chloride (K-DUR) 10 MEQ tablet; Take 1 tablet (10 mEq total) by mouth daily.  Dispense: 90 tablet; Refill: 0  4. 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  Kesley Gaffey Asad A. [facility] 02/25/2016 8:02 PM

## 2016-02-24 ENCOUNTER — Other Ambulatory Visit: Payer: Self-pay | Admitting: Family Medicine

## 2016-02-25 NOTE — Telephone Encounter (Signed)
Medication has been refilled and sent to [facility]

## 2016-04-06 DIAGNOSIS — H401132 Primary open-angle glaucoma, bilateral, moderate stage: Secondary | ICD-10-CM | POA: Diagnosis not present

## 2016-04-08 ENCOUNTER — Ambulatory Visit
Admission: RE | Admit: 2016-04-08 | Discharge: 2016-04-08 | Disposition: A | Discharge status: Home / Self Care | Payer: Medicare Other | Source: Ambulatory Visit | Attending: Family Medicine | Admitting: Family Medicine

## 2016-04-08 ENCOUNTER — Encounter: Payer: Self-pay | Admitting: Family Medicine

## 2016-04-08 ENCOUNTER — Ambulatory Visit (INDEPENDENT_AMBULATORY_CARE_PROVIDER_SITE_OTHER): Payer: Medicare Other | Admitting: Family Medicine

## 2016-04-08 DIAGNOSIS — R0789 Other chest pain: Secondary | ICD-10-CM

## 2016-04-08 DIAGNOSIS — K449 Diaphragmatic hernia without obstruction or gangrene: Secondary | ICD-10-CM | POA: Diagnosis not present

## 2016-04-08 MED ORDER — LIDOCAINE 5 % EX PTCH: 1.0000 | MEDICATED_PATCH | CUTANEOUS | 0 refills | Status: DC

## 2016-04-08 NOTE — Progress Notes (Signed)
Name: Julia Herrera   MRN: [id number]    DOB: 09-01-1925   Date:04/08/2016       Progress Note  Subjective  Chief Complaint  Chief Complaint  Patient presents with  . Back Pain    HPI  Pt. Presents with 3 day history of left sided middle back and lateral chest wall pain, worse in the evening when she lays down and also during the day when she is active.  She has tried some 'pain pills' although not sure which ones.  She has no trouble breathing, or urinating. No leg pain or numbness.   Past Medical History:  Diagnosis Date  . Chronic kidney disease   . Gout   . Hyperlipidemia   . Hypertension   . Osteoporosis     Past Surgical History:  Procedure Laterality Date  . CHOLECYSTECTOMY      No family history on file.  Social History   Social History  . Marital status: Single    Spouse name: N/A  . Number of children: N/A  . Years of education: N/A   Occupational History  . Not on file.   Social History Main Topics  . Smoking status: Never Smoker  . Smokeless tobacco: Current User    Types: Snuff  . Alcohol use No     Comment: Former  . Drug use: No  . Sexual activity: Not on file   Other Topics Concern  . Not on file   Social History Narrative  . No narrative on file     Current Outpatient Prescriptions:  .  allopurinol (ZYLOPRIM) 100 MG tablet, Take 1 tablet (100 mg total) by mouth 2 (two) times daily., Disp: 180 tablet, Rfl: 1 .  amLODipine (NORVASC) 5 MG tablet, Take 1 tablet (5 mg total) by mouth daily., Disp: 90 tablet, Rfl: 1 .  atorvastatin (LIPITOR) 10 MG tablet, Take 1 tablet (10 mg total) by mouth daily., Disp: 90 tablet, Rfl: 1 .  doxepin (SINEQUAN) 25 MG capsule, , Disp: , Rfl:  .  hydrOXYzine (ATARAX/VISTARIL) 25 MG tablet, Take 1 tablet (25 mg total) by mouth 3 (three) times daily as needed., Disp: 30 tablet, Rfl: 0 .  mometasone (ELOCON) 0.1 % cream, Apply 1 application topically daily., Disp: 15 g, Rfl: 0 .  omeprazole (PRILOSEC) 20 MG  capsule, Take 1 capsule (20 mg total) by mouth daily., Disp: 90 capsule, Rfl: 1 .  potassium chloride (K-DUR) 10 MEQ tablet, Take 1 tablet (10 mEq total) by mouth daily., Disp: 90 tablet, Rfl: 0 .  valsartan-hydrochlorothiazide (DIOVAN-HCT) 160-12.5 MG tablet, Take 1 tablet by mouth daily., Disp: 90 tablet, Rfl: 1 .  ZETIA 10 MG tablet, TAKE 1 TABLET BY MOUTH EVERY DAY., Disp: 30 tablet, Rfl: 0  Allergies  Allergen Reactions  . Allopurinol Other (See Comments)    dizziness  . Fosamax [Alendronate] Nausea Only  . Niaspan [Niacin Er] Other (See Comments)    unknown  . Zocor [Simvastatin] Other (See Comments)    Muscle pain     Review of Systems  Constitutional: Negative for chills, fever and malaise/fatigue.  Respiratory: Negative for cough, sputum production and shortness of breath.   Cardiovascular: Positive for chest pain.  Gastrointestinal: Negative for abdominal pain.    Objective  Vitals:   04/08/16 0939  BP: 124/80  Pulse: 80  Resp: 16  Temp: 97.2 F (36.2 C)  TempSrc: Oral  SpO2: 99%  Weight: 146 lb (66.2 kg)  Height: 5' (1.524 m)  Physical Exam  Constitutional: She is oriented to person, place, and time and well-developed, well-nourished, and in no distress.  Cardiovascular: Normal rate, regular rhythm, S1 normal, S2 normal and normal heart sounds.   No murmur heard. Pulmonary/Chest: No respiratory distress. She has no decreased breath sounds. She has rales in the left middle field and the left lower field.  Abdominal: Soft. Bowel sounds are normal. There is no tenderness.  Musculoskeletal:       Thoracic back: She exhibits tenderness and pain. She exhibits no spasm.       Back:  Tenderness to palpation over the left lateral inferior chest wall, no tenderness in the abdomen  Neurological: She is alert and oriented to person, place, and time.  Psychiatric: Mood, memory, affect and judgment normal.  Nursing note and vitals reviewed.    Assessment &  Plan  1. Left-sided chest wall pain Suspect costochondritis, no history of trauma to the left chest wall. Obtain rib series along with chest x-ray, started on Lidoderm patch for relief. - DG Ribs Unilateral W/Chest Left; Future - lidocaine (LIDODERM) 5 %; Place 1 patch onto the skin daily. Remove & Discard patch within 12 hours or as directed by MD  Dispense: 5 patch; Refill: 0   Cristofer Yaffe Asad A. [facility] 04/08/2016 9:51 AM

## 2016-04-12 DIAGNOSIS — N183 Chronic kidney disease, stage 3 (moderate): Secondary | ICD-10-CM | POA: Diagnosis not present

## 2016-04-12 DIAGNOSIS — I129 Hypertensive chronic kidney disease with stage 1 through stage 4 chronic kidney disease, or unspecified chronic kidney disease: Secondary | ICD-10-CM | POA: Diagnosis not present

## 2016-04-12 DIAGNOSIS — I10 Essential (primary) hypertension: Secondary | ICD-10-CM | POA: Diagnosis not present

## 2016-04-13 ENCOUNTER — Telehealth: Payer: Self-pay | Admitting: Emergency Medicine

## 2016-04-13 NOTE — Telephone Encounter (Signed)
Patient sister called [facility]) and stated that Ms. Miliani was out of Doxepin and Hydroxyzine. Patient was told that the Hydroxyzine was when she had the allergic reaction to medication. Patient did not need refills on that. She stated the Doxepin has not been taken since May of 2017. DO patient need to be taking that med

## 2016-04-13 NOTE — Telephone Encounter (Signed)
Doxepin is usually prescribed for insomnia, if patient is sleeping fine without Doxepin, she may discontinue it. If she needs a refill we will be glad to provide one.

## 2016-04-14 NOTE — Telephone Encounter (Signed)
Spoke to daughter [name]. Informed her patient did not need the medication. She stated that [facility] sleeps fine.

## 2016-04-14 NOTE — Telephone Encounter (Signed)
Called patient back NA 

## 2016-04-16 ENCOUNTER — Telehealth: Payer: Self-pay

## 2016-04-16 NOTE — Telephone Encounter (Signed)
Per the request of Dr. Manuella Ghazi: I tried to contact this patient to see if she was still in pain but there was no answer and I was not able to leave a message.

## 2016-04-16 NOTE — Telephone Encounter (Signed)
-----   Message from Roselee Nova, MD sent at 04/15/2016  5:22 PM EST ----- If patient is still having pain, she may be started on Diclofenac for 5-7 day course. Please confirm with patient and we'll send a prescription to her pharmacy ----- Message ----- From: Lynelle Smoke, CMA Sent: 04/15/2016  12:47 PM To: Roselee Nova, MD  I spoke with [facility] with [employer] [phone]) and she told me that the only way they will cover the Lidocaine patch is if this patient has Diabetic Neuropathy or Post Herpetic Neuralgia.   I asked if there was anything similar to this medication and she said that she needed specific names, but that most will require a PA.  Please advise on how you would like to proceed.

## 2016-05-13 ENCOUNTER — Encounter: Payer: Self-pay | Admitting: Family Medicine

## 2016-05-14 ENCOUNTER — Other Ambulatory Visit: Payer: Self-pay | Admitting: Family Medicine

## 2016-05-18 ENCOUNTER — Other Ambulatory Visit: Payer: Self-pay | Admitting: Family Medicine

## 2016-05-24 ENCOUNTER — Ambulatory Visit (INDEPENDENT_AMBULATORY_CARE_PROVIDER_SITE_OTHER): Payer: Medicare Other | Admitting: Family Medicine

## 2016-05-24 ENCOUNTER — Encounter: Payer: Self-pay | Admitting: Family Medicine

## 2016-05-24 VITALS — BP 124/90 | HR 89 | Temp 98.6°F | Resp 16 | Ht 60.0 in | Wt 151.7 lb

## 2016-05-24 DIAGNOSIS — E7800 Pure hypercholesterolemia, unspecified: Secondary | ICD-10-CM | POA: Diagnosis not present

## 2016-05-24 DIAGNOSIS — K2100 Gastro-esophageal reflux disease with esophagitis, without bleeding: Secondary | ICD-10-CM

## 2016-05-24 DIAGNOSIS — I10 Essential (primary) hypertension: Secondary | ICD-10-CM | POA: Diagnosis not present

## 2016-05-24 LAB — COMPLETE METABOLIC PANEL WITH GFR
ALBUMIN: 4.1 g/dL (ref 3.6–5.1)
ALT: 9 U/L (ref 6–29)
AST: 14 U/L (ref 10–35)
Alkaline Phosphatase: 60 U/L (ref 33–130)
BILIRUBIN TOTAL: 0.9 mg/dL (ref 0.2–1.2)
BUN: 15 mg/dL (ref 7–25)
CALCIUM: 10 mg/dL (ref 8.6–10.4)
CO2: 26 mmol/L (ref 20–31)
CREATININE: 1.88 mg/dL — AB (ref 0.60–0.88)
Chloride: 106 mmol/L (ref 98–110)
GFR, Est African American: 27 mL/min — ABNORMAL LOW (ref >=60)
GFR, Est Non African American: 24 mL/min — ABNORMAL LOW (ref >=60)
Glucose, Bld: 90 mg/dL (ref 65–99)
Potassium: 4.8 mmol/L (ref 3.5–5.3)
Sodium: 143 mmol/L (ref 135–146)
TOTAL PROTEIN: 6.9 g/dL (ref 6.1–8.1)

## 2016-05-24 LAB — LIPID PANEL
CHOL/HDL RATIO: 2.8 ratio (ref <5.0)
Cholesterol: 169 mg/dL (ref <200)
HDL: 60 mg/dL (ref >50)
LDL Cholesterol: 96 mg/dL (ref <100)
Triglycerides: 64 mg/dL (ref <150)
VLDL: 13 mg/dL (ref <30)

## 2016-05-24 MED ORDER — VALSARTAN-HYDROCHLOROTHIAZIDE 160-12.5 MG PO TABS: 1.0000 | ORAL_TABLET | Freq: Every day | ORAL | 1 refills | Status: DC

## 2016-05-24 MED ORDER — OMEPRAZOLE 20 MG PO CPDR: 20.0000 mg | DELAYED_RELEASE_CAPSULE | Freq: Every day | ORAL | 1 refills | Status: DC

## 2016-05-24 NOTE — Progress Notes (Signed)
Name: Julia Herrera   MRN: [id number]    DOB: March 29, 1925   Date:05/24/2016       Progress Note  Subjective  Chief Complaint  Chief Complaint  Patient presents with  . Follow-up    3 mo  . Medication Refill    Gastroesophageal Reflux  She complains of heartburn. She reports no abdominal pain, no chest pain, no dysphagia or no nausea. This is a chronic problem. The problem has been unchanged. She has tried a PPI for the symptoms. Past procedures do not include an EGD.  Hypertension  This is a chronic problem. The problem is unchanged. The problem is controlled. Associated symptoms include headaches (sometime has occasional headache). Pertinent negatives include no blurred vision, chest pain, malaise/fatigue, palpitations or shortness of breath. Past treatments include calcium channel blockers, diuretics and angiotensin blockers.     Past Medical History:  Diagnosis Date  . Chronic kidney disease   . Gout   . Hyperlipidemia   . Hypertension   . Osteoporosis     Past Surgical History:  Procedure Laterality Date  . CHOLECYSTECTOMY      History reviewed. No pertinent family history.  Social History   Social History  . Marital status: Single    Spouse name: N/A  . Number of children: N/A  . Years of education: N/A   Occupational History  . Not on file.   Social History Main Topics  . Smoking status: Never Smoker  . Smokeless tobacco: Current User    Types: Snuff  . Alcohol use No     Comment: Former  . Drug use: No  . Sexual activity: Not on file   Other Topics Concern  . Not on file   Social History Narrative  . No narrative on file     Current Outpatient Prescriptions:  .  allopurinol (ZYLOPRIM) 100 MG tablet, Take 1 tablet (100 mg total) by mouth 2 (two) times daily., Disp: 180 tablet, Rfl: 1 .  amLODipine (NORVASC) 5 MG tablet, Take 1 tablet (5 mg total) by mouth daily., Disp: 90 tablet, Rfl: 1 .  atorvastatin (LIPITOR) 10 MG tablet, Take 1 tablet (10 mg  total) by mouth daily., Disp: 90 tablet, Rfl: 1 .  doxepin (SINEQUAN) 25 MG capsule, , Disp: , Rfl:  .  hydrOXYzine (ATARAX/VISTARIL) 25 MG tablet, Take 1 tablet (25 mg total) by mouth 3 (three) times daily as needed., Disp: 30 tablet, Rfl: 0 .  lidocaine (LIDODERM) 5 %, Place 1 patch onto the skin daily. Remove & Discard patch within 12 hours or as directed by MD, Disp: 5 patch, Rfl: 0 .  mometasone (ELOCON) 0.1 % cream, Apply 1 application topically daily., Disp: 15 g, Rfl: 0 .  omeprazole (PRILOSEC) 20 MG capsule, Take 1 capsule (20 mg total) by mouth daily., Disp: 90 capsule, Rfl: 1 .  potassium chloride (K-DUR) 10 MEQ tablet, Take 1 tablet (10 mEq total) by mouth daily., Disp: 90 tablet, Rfl: 0 .  valsartan-hydrochlorothiazide (DIOVAN-HCT) 160-12.5 MG tablet, Take 1 tablet by mouth daily., Disp: 90 tablet, Rfl: 1 .  ZETIA 10 MG tablet, TAKE 1 TABLET BY MOUTH EVERY DAY., Disp: 30 tablet, Rfl: 0  Allergies  Allergen Reactions  . Allopurinol Other (See Comments)    dizziness  . Fosamax [Alendronate] Nausea Only  . Niaspan [Niacin Er] Other (See Comments)    unknown  . Zocor [Simvastatin] Other (See Comments)    Muscle pain     Review of Systems  Constitutional: Negative  for chills, fever and malaise/fatigue.  Eyes: Negative for blurred vision.  Respiratory: Negative for shortness of breath.   Cardiovascular: Negative for chest pain and palpitations.  Gastrointestinal: Positive for heartburn. Negative for abdominal pain, dysphagia and nausea.  Neurological: Positive for headaches (sometime has occasional headache).     Objective  Vitals:   05/24/16 1129  BP: 124/90  Pulse: 89  Resp: 16  Temp: 98.6 F (37 C)  TempSrc: Oral  SpO2: 97%  Weight: 151 lb 11.2 oz (68.8 kg)  Height: 5' (1.524 m)    Physical Exam  Constitutional: She is oriented to person, place, and time and well-developed, well-nourished, and in no distress.  HENT:  Head: Normocephalic and atraumatic.   Cardiovascular: Normal rate, regular rhythm, S1 normal, S2 normal and normal heart sounds.   Pulmonary/Chest: No respiratory distress. She has no decreased breath sounds. She has no wheezes.  Abdominal: Soft. Bowel sounds are normal. There is no tenderness.  Musculoskeletal:       Right ankle: She exhibits no swelling.       Left ankle: She exhibits no swelling.       Lumbar back: She exhibits no bony tenderness.  Neurological: She is alert and oriented to person, place, and time.  Psychiatric: Mood, memory, affect and judgment normal.  Nursing note and vitals reviewed.     Assessment & Plan  1. Gastroesophageal reflux disease with esophagitis Symptoms controlled on PPI. - omeprazole (PRILOSEC) 20 MG capsule; Take 1 capsule (20 mg total) by mouth daily.  Dispense: 90 capsule; Refill: 1  2. Essential hypertension BP stable on present antihypertensive therapy. - valsartan-hydrochlorothiazide (DIOVAN-HCT) 160-12.5 MG tablet; Take 1 tablet by mouth daily.  Dispense: 90 tablet; Refill: 1  3. Pure hypercholesterolemia  - Lipid panel - COMPLETE METABOLIC PANEL WITH GFR   Bevely Hackbart [facility] 05/24/2016 11:35 AM

## 2016-08-26 ENCOUNTER — Ambulatory Visit (INDEPENDENT_AMBULATORY_CARE_PROVIDER_SITE_OTHER): Payer: Medicare Other | Admitting: Family Medicine

## 2016-08-26 ENCOUNTER — Encounter: Payer: Self-pay | Admitting: Family Medicine

## 2016-08-26 VITALS — BP 122/80 | HR 103 | Temp 97.4°F | Resp 16 | Ht 60.0 in | Wt 138.5 lb

## 2016-08-26 DIAGNOSIS — I10 Essential (primary) hypertension: Secondary | ICD-10-CM

## 2016-08-26 DIAGNOSIS — E7800 Pure hypercholesterolemia, unspecified: Secondary | ICD-10-CM

## 2016-08-26 DIAGNOSIS — M1A4710 Other secondary chronic gout, right ankle and foot, without tophus (tophi): Secondary | ICD-10-CM

## 2016-08-26 LAB — LIPID PANEL
CHOL/HDL RATIO: 4 ratio (ref <5.0)
Cholesterol: 189 mg/dL (ref <200)
HDL: 47 mg/dL — AB (ref >50)
LDL CALC: 121 mg/dL — AB (ref <100)
TRIGLYCERIDES: 103 mg/dL (ref <150)
VLDL: 21 mg/dL (ref <30)

## 2016-08-26 LAB — COMPLETE METABOLIC PANEL WITH GFR
ALK PHOS: 55 U/L (ref 33–130)
ALT: 7 U/L (ref 6–29)
AST: 13 U/L (ref 10–35)
Albumin: 3.6 g/dL (ref 3.6–5.1)
BILIRUBIN TOTAL: 0.6 mg/dL (ref 0.2–1.2)
BUN: 14 mg/dL (ref 7–25)
CALCIUM: 9.1 mg/dL (ref 8.6–10.4)
CO2: 23 mmol/L (ref 20–31)
CREATININE: 1.8 mg/dL — AB (ref 0.60–0.88)
Chloride: 103 mmol/L (ref 98–110)
GFR, EST AFRICAN AMERICAN: 29 mL/min — AB (ref >=60)
GFR, Est Non African American: 25 mL/min — ABNORMAL LOW (ref >=60)
Glucose, Bld: 69 mg/dL (ref 65–99)
Potassium: 3.8 mmol/L (ref 3.5–5.3)
Sodium: 137 mmol/L (ref 135–146)
TOTAL PROTEIN: 6.3 g/dL (ref 6.1–8.1)

## 2016-08-26 MED ORDER — ATORVASTATIN CALCIUM 10 MG PO TABS: 10.0000 mg | ORAL_TABLET | Freq: Every day | ORAL | 1 refills | Status: DC

## 2016-08-26 NOTE — Progress Notes (Signed)
Name: Julia Herrera   MRN: [id number]    DOB: [birth year]   Date:08/26/2016       Progress Note  Subjective  Chief Complaint  Chief Complaint  Patient presents with  . Acute Visit    not feeling well    Hypertension  This is a chronic problem. The problem is unchanged. The problem is controlled. Pertinent negatives include no blurred vision, chest pain, headaches, palpitations or shortness of breath. Past treatments include calcium channel blockers, angiotensin blockers and diuretics. There is no history of kidney disease, CAD/MI or CVA.  Hyperlipidemia  This is a chronic problem. The problem is controlled. Recent lipid tests were reviewed and are normal. Pertinent negatives include no chest pain, leg pain, myalgias or shortness of breath. Current antihyperlipidemic treatment includes statins and ezetimibe.    Patient has history of gout, currently on Allopurinol 100 mg twice daily, last gout attack was many years ago, however her Uric acid level is elevated to 7.0, last GFR was 32  Past Medical History:  Diagnosis Date  . Chronic kidney disease   . Gout   . Hyperlipidemia   . Hypertension   . Osteoporosis     Past Surgical History:  Procedure Laterality Date  . CHOLECYSTECTOMY      History reviewed. No pertinent family history.  Social History   Social History  . Marital status: Single    Spouse name: N/A  . Number of children: N/A  . Years of education: N/A   Occupational History  . Not on file.   Social History Main Topics  . Smoking status: Never Smoker  . Smokeless tobacco: Current User    Types: Snuff  . Alcohol use No     Comment: Former  . Drug use: No  . Sexual activity: Not on file   Other Topics Concern  . Not on file   Social History Narrative  . No narrative on file     Current Outpatient Prescriptions:  .  allopurinol (ZYLOPRIM) 100 MG tablet, Take 1 tablet (100 mg total) by mouth 2 (two) times daily., Disp: 180 tablet, Rfl: 1 .  amLODipine  (NORVASC) 5 MG tablet, Take 1 tablet (5 mg total) by mouth daily., Disp: 90 tablet, Rfl: 1 .  atorvastatin (LIPITOR) 10 MG tablet, Take 1 tablet (10 mg total) by mouth daily., Disp: 90 tablet, Rfl: 1 .  doxepin (SINEQUAN) 25 MG capsule, , Disp: , Rfl:  .  hydrOXYzine (ATARAX/VISTARIL) 25 MG tablet, Take 1 tablet (25 mg total) by mouth 3 (three) times daily as needed., Disp: 30 tablet, Rfl: 0 .  lidocaine (LIDODERM) 5 %, Place 1 patch onto the skin daily. Remove & Discard patch within 12 hours or as directed by MD, Disp: 5 patch, Rfl: 0 .  mometasone (ELOCON) 0.1 % cream, Apply 1 application topically daily., Disp: 15 g, Rfl: 0 .  omeprazole (PRILOSEC) 20 MG capsule, Take 1 capsule (20 mg total) by mouth daily., Disp: 90 capsule, Rfl: 1 .  potassium chloride (K-DUR) 10 MEQ tablet, Take 1 tablet (10 mEq total) by mouth daily., Disp: 90 tablet, Rfl: 0 .  valsartan-hydrochlorothiazide (DIOVAN-HCT) 160-12.5 MG tablet, Take 1 tablet by mouth daily., Disp: 90 tablet, Rfl: 1 .  ZETIA 10 MG tablet, TAKE 1 TABLET BY MOUTH EVERY DAY., Disp: 30 tablet, Rfl: 0  Allergies  Allergen Reactions  . Allopurinol Other (See Comments)    dizziness  . Fosamax [Alendronate] Nausea Only  . Niaspan [Niacin Er] Other (See Comments)  unknown  . Zocor [Simvastatin] Other (See Comments)    Muscle pain     Review of Systems  Eyes: Negative for blurred vision.  Respiratory: Negative for shortness of breath.   Cardiovascular: Negative for chest pain and palpitations.  Musculoskeletal: Negative for myalgias.  Neurological: Negative for headaches.     Objective  Vitals:   08/26/16 1010  BP: 122/80  Pulse: (!) 103  Resp: 16  Temp: 97.4 F (36.3 C)  TempSrc: Oral  SpO2: 96%  Weight: 138 lb 8 oz (62.8 kg)  Height: 5' (1.524 m)    Physical Exam  Constitutional: She is oriented to person, place, and time and well-developed, well-nourished, and in no distress.  HENT:  Head: Normocephalic and atraumatic.   Cardiovascular: Normal rate, regular rhythm and normal heart sounds.   No murmur heard. Pulmonary/Chest: Effort normal and breath sounds normal. She has no wheezes.  Abdominal: Soft. Bowel sounds are normal. There is no tenderness.  Musculoskeletal: She exhibits no edema.  Neurological: She is alert and oriented to person, place, and time.  Psychiatric: Mood, memory, affect and judgment normal.  Nursing note and vitals reviewed.    Assessment & Plan  1. Pure hypercholesterolemia Obtain FLP, continue on statin - atorvastatin (LIPITOR) 10 MG tablet; Take 1 tablet (10 mg total) by mouth daily.  Dispense: 90 tablet; Refill: 1 - Lipid panel  2. Essential hypertension BP stable on present anti-hypertensive therapy  3. 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 - COMPLETE METABOLIC PANEL WITH GFR - Uric acid   Chelsa Stout [facility] 08/26/2016 10:20 AM

## 2016-08-27 LAB — URIC ACID: Uric Acid, Serum: 6.2 mg/dL (ref 2.5–7.0)

## 2016-10-28 ENCOUNTER — Other Ambulatory Visit: Payer: Self-pay | Admitting: Family Medicine

## 2016-10-28 DIAGNOSIS — E876 Hypokalemia: Secondary | ICD-10-CM

## 2016-10-28 DIAGNOSIS — T502X5A Adverse effect of carbonic-anhydrase inhibitors, benzothiadiazides and other diuretics, initial encounter: Principal | ICD-10-CM

## 2016-11-24 ENCOUNTER — Ambulatory Visit (INDEPENDENT_AMBULATORY_CARE_PROVIDER_SITE_OTHER): Payer: Medicare Other | Admitting: Family Medicine

## 2016-11-24 ENCOUNTER — Encounter: Payer: Self-pay | Admitting: Family Medicine

## 2016-11-24 VITALS — BP 122/76 | HR 80 | Temp 97.6°F | Resp 16 | Ht 60.0 in | Wt 132.3 lb

## 2016-11-24 DIAGNOSIS — M1A4710 Other secondary chronic gout, right ankle and foot, without tophus (tophi): Secondary | ICD-10-CM | POA: Diagnosis not present

## 2016-11-24 DIAGNOSIS — Z23 Encounter for immunization: Secondary | ICD-10-CM

## 2016-11-24 DIAGNOSIS — E7800 Pure hypercholesterolemia, unspecified: Secondary | ICD-10-CM

## 2016-11-24 DIAGNOSIS — I10 Essential (primary) hypertension: Secondary | ICD-10-CM | POA: Diagnosis not present

## 2016-11-24 DIAGNOSIS — K2100 Gastro-esophageal reflux disease with esophagitis, without bleeding: Secondary | ICD-10-CM

## 2016-11-24 LAB — LIPID PANEL
CHOLESTEROL: 187 mg/dL (ref <200)
HDL: 63 mg/dL (ref >50)
LDL Cholesterol (Calc): 108 mg/dL (calc) — ABNORMAL HIGH
Non-HDL Cholesterol (Calc): 124 mg/dL (calc) (ref <130)
Total CHOL/HDL Ratio: 3 (calc) (ref <5.0)
Triglycerides: 73 mg/dL (ref <150)

## 2016-11-24 LAB — COMPLETE METABOLIC PANEL WITH GFR
AG RATIO: 1.5 (calc) (ref 1.0–2.5)
ALKALINE PHOSPHATASE (APISO): 60 U/L (ref 33–130)
ALT: 5 U/L — ABNORMAL LOW (ref 6–29)
AST: 12 U/L (ref 10–35)
Albumin: 4.1 g/dL (ref 3.6–5.1)
BUN/Creatinine Ratio: 7 (calc) (ref 6–22)
BUN: 12 mg/dL (ref 7–25)
CO2: 26 mmol/L (ref 20–32)
Calcium: 10.2 mg/dL (ref 8.6–10.4)
Chloride: 104 mmol/L (ref 98–110)
Creat: 1.78 mg/dL — ABNORMAL HIGH (ref 0.60–0.88)
GFR, Est African American: 29 mL/min/{1.73_m2} — ABNORMAL LOW (ref >=60)
GFR, Est Non African American: 25 mL/min/{1.73_m2} — ABNORMAL LOW (ref >=60)
GLOBULIN: 2.7 g/dL (ref 1.9–3.7)
Glucose, Bld: 93 mg/dL (ref 65–99)
POTASSIUM: 4.8 mmol/L (ref 3.5–5.3)
SODIUM: 140 mmol/L (ref 135–146)
Total Bilirubin: 1 mg/dL (ref 0.2–1.2)
Total Protein: 6.8 g/dL (ref 6.1–8.1)

## 2016-11-24 LAB — URIC ACID: Uric Acid, Serum: 5.2 mg/dL (ref 2.5–7.0)

## 2016-11-24 MED ORDER — AMLODIPINE BESYLATE 5 MG PO TABS: 5.0000 mg | ORAL_TABLET | Freq: Every day | ORAL | 1 refills | Status: DC

## 2016-11-24 MED ORDER — VALSARTAN-HYDROCHLOROTHIAZIDE 160-12.5 MG PO TABS: 1.0000 | ORAL_TABLET | Freq: Every day | ORAL | 1 refills | Status: DC

## 2016-11-24 MED ORDER — OMEPRAZOLE 20 MG PO CPDR: 20.0000 mg | DELAYED_RELEASE_CAPSULE | Freq: Every day | ORAL | 1 refills | Status: DC

## 2016-11-24 NOTE — Progress Notes (Signed)
Name: Julia Herrera   MRN: [id number]    DOB: 14-Sep-1925   Date:11/24/2016       Progress Note  Subjective  Chief Complaint  Chief Complaint  Patient presents with  . Follow-up    6 mo  . Hypertension  . Hyperlipidemia  . Gastroesophageal Reflux    Gastroesophageal Reflux  She reports no abdominal pain, no chest pain, no dysphagia, no heartburn or no nausea. This is a chronic problem. The problem has been unchanged. The symptoms are aggravated by certain foods. She has tried a PPI for the symptoms. Past procedures do not include an EGD.  Hypertension  This is a chronic problem. The problem is unchanged. The problem is controlled. Pertinent negatives include no blurred vision, chest pain, headaches, malaise/fatigue, palpitations or shortness of breath. Past treatments include calcium channel blockers, diuretics and angiotensin blockers.  Hyperlipidemia  This is a chronic problem. The problem is uncontrolled. Recent lipid tests were reviewed and are high. Pertinent negatives include no chest pain, myalgias or shortness of breath. Current antihyperlipidemic treatment includes statins.   Patient has history of gout affecting the right foot, she takes Allopurinol 100 mg daily, no recent gout attacks, last GFR was 29.     Past Medical History:  Diagnosis Date  . Chronic kidney disease   . Gout   . Hyperlipidemia   . Hypertension   . Osteoporosis     Past Surgical History:  Procedure Laterality Date  . CHOLECYSTECTOMY      History reviewed. No pertinent family history.  Social History   Social History  . Marital status: Single    Spouse name: N/A  . Number of children: N/A  . Years of education: N/A   Occupational History  . Not on file.   Social History Main Topics  . Smoking status: Never Smoker  . Smokeless tobacco: Current User    Types: Snuff  . Alcohol use No     Comment: Former  . Drug use: No  . Sexual activity: Not on file   Other Topics Concern  . Not on  file   Social History Narrative  . No narrative on file     Current Outpatient Prescriptions:  .  allopurinol (ZYLOPRIM) 100 MG tablet, Take 1 tablet (100 mg total) by mouth 2 (two) times daily., Disp: 180 tablet, Rfl: 1 .  amLODipine (NORVASC) 5 MG tablet, Take 1 tablet (5 mg total) by mouth daily., Disp: 90 tablet, Rfl: 1 .  atorvastatin (LIPITOR) 10 MG tablet, Take 1 tablet (10 mg total) by mouth daily., Disp: 90 tablet, Rfl: 1 .  doxepin (SINEQUAN) 25 MG capsule, , Disp: , Rfl:  .  hydrOXYzine (ATARAX/VISTARIL) 25 MG tablet, Take 1 tablet (25 mg total) by mouth 3 (three) times daily as needed., Disp: 30 tablet, Rfl: 0 .  lidocaine (LIDODERM) 5 %, Place 1 patch onto the skin daily. Remove & Discard patch within 12 hours or as directed by MD, Disp: 5 patch, Rfl: 0 .  mometasone (ELOCON) 0.1 % cream, Apply 1 application topically daily., Disp: 15 g, Rfl: 0 .  omeprazole (PRILOSEC) 20 MG capsule, Take 1 capsule (20 mg total) by mouth daily., Disp: 90 capsule, Rfl: 1 .  potassium chloride (K-DUR) 10 MEQ tablet, TAKE 1 TABLET BY MOUTH EVERY DAY., Disp: 90 tablet, Rfl: 0 .  valsartan-hydrochlorothiazide (DIOVAN-HCT) 160-12.5 MG tablet, Take 1 tablet by mouth daily., Disp: 90 tablet, Rfl: 1 .  ZETIA 10 MG tablet, TAKE 1 TABLET BY  MOUTH EVERY DAY., Disp: 30 tablet, Rfl: 0  Allergies  Allergen Reactions  . Allopurinol Other (See Comments)    dizziness  . Fosamax [Alendronate] Nausea Only  . Niaspan [Niacin Er] Other (See Comments)    unknown  . Zocor [Simvastatin] Other (See Comments)    Muscle pain     Review of Systems  Constitutional: Negative for malaise/fatigue.  Eyes: Negative for blurred vision.  Respiratory: Negative for shortness of breath.   Cardiovascular: Negative for chest pain and palpitations.  Gastrointestinal: Negative for abdominal pain, dysphagia, heartburn and nausea.  Musculoskeletal: Negative for myalgias.  Neurological: Negative for headaches.       Objective  Vitals:   11/24/16 0923  BP: 122/76  Pulse: 80  Resp: 16  Temp: 97.6 F (36.4 C)  TempSrc: Oral  SpO2: 96%  Weight: 132 lb 4.8 oz (60 kg)  Height: 5' (1.524 m)    Physical Exam  Constitutional: She is oriented to person, place, and time and well-developed, well-nourished, and in no distress.  HENT:  Head: Normocephalic and atraumatic.  Cardiovascular: Normal rate, regular rhythm and normal heart sounds.   No murmur heard. Pulmonary/Chest: Effort normal and breath sounds normal.  Abdominal: Soft. Bowel sounds are normal. There is no tenderness.  Neurological: She is alert and oriented to person, place, and time.  Psychiatric: Mood, memory, affect and judgment normal.  Nursing note and vitals reviewed.      Recent Results (from the past 2160 hour(s))  COMPLETE METABOLIC PANEL WITH GFR     Status: Abnormal   Collection Time: 08/26/16 10:42 AM  Result Value Ref Range   Sodium 137 135 - 146 mmol/L   Potassium 3.8 3.5 - 5.3 mmol/L   Chloride 103 98 - 110 mmol/L   CO2 23 20 - 31 mmol/L   Glucose, Bld 69 65 - 99 mg/dL   BUN 14 7 - 25 mg/dL   Creat 1.80 (H) 0.60 - 0.88 mg/dL    Comment:   For patients > or = 81 years of age: The upper reference limit for Creatinine is approximately 13% higher for people identified as African-American.      Total Bilirubin 0.6 0.2 - 1.2 mg/dL   Alkaline Phosphatase 55 33 - 130 U/L   AST 13 10 - 35 U/L   ALT 7 6 - 29 U/L   Total Protein 6.3 6.1 - 8.1 g/dL   Albumin 3.6 3.6 - 5.1 g/dL   Calcium 9.1 8.6 - 10.4 mg/dL   GFR, Est African American 29 (L) >=60 mL/min   GFR, Est Non African American 25 (L) >=60 mL/min  Uric acid     Status: None   Collection Time: 08/26/16 10:42 AM  Result Value Ref Range   Uric Acid, Serum 6.2 2.5 - 7.0 mg/dL  Lipid panel     Status: Abnormal   Collection Time: 08/26/16 10:42 AM  Result Value Ref Range   Cholesterol 189 <200 mg/dL   Triglycerides 103 <150 mg/dL   HDL 47 (L) >50  mg/dL   Total CHOL/HDL Ratio 4.0 <5.0 Ratio   VLDL 21 <30 mg/dL   LDL Cholesterol 121 (H) <100 mg/dL     Assessment & Plan  1. Essential hypertension BP stable on present antihypertensive treatment - valsartan-hydrochlorothiazide (DIOVAN-HCT) 160-12.5 MG tablet; Take 1 tablet by mouth daily.  Dispense: 90 tablet; Refill: 1 - amLODipine (NORVASC) 5 MG tablet; Take 1 tablet (5 mg total) by mouth daily.  Dispense: 90 tablet; Refill: 1  2. Gastroesophageal reflux disease with esophagitis Symptoms of gastroesophageal reflux are responsive to PPI - omeprazole (PRILOSEC) 20 MG capsule; Take 1 capsule (20 mg total) by mouth daily.  Dispense: 90 capsule; Refill: 1  3. 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 Pure hypercholesterolemia  - Lipid panel   5. Need for influenza vaccination  - Flu vaccine HIGH DOSE PF (Fluzone High dose)  Jassica Zazueta [facility] 11/24/2016 9:30 AM

## 2016-11-25 ENCOUNTER — Telehealth: Payer: Self-pay

## 2016-11-25 MED ORDER — ATORVASTATIN CALCIUM 20 MG PO TABS: 20.0000 mg | ORAL_TABLET | Freq: Every day | ORAL | 0 refills | Status: DC

## 2016-11-25 NOTE — Telephone Encounter (Signed)
Patient has been notified of lab results and a new prescription for atorvastatin 20 mg daily by mouth at bedtime #90 #0 refills has been sent to [place] per Dr. Manuella Ghazi, patient verbalized understanding

## 2017-02-24 ENCOUNTER — Encounter: Payer: Self-pay | Admitting: Family Medicine

## 2017-03-01 ENCOUNTER — Encounter: Payer: Self-pay | Admitting: Family Medicine

## 2017-03-14 ENCOUNTER — Encounter: Payer: Self-pay | Admitting: Family Medicine

## 2017-03-14 ENCOUNTER — Ambulatory Visit (INDEPENDENT_AMBULATORY_CARE_PROVIDER_SITE_OTHER): Payer: Medicare Other | Admitting: Family Medicine

## 2017-03-14 VITALS — BP 124/72 | HR 58 | Temp 98.0°F | Resp 14 | Ht 60.0 in | Wt 133.7 lb

## 2017-03-14 DIAGNOSIS — K2100 Gastro-esophageal reflux disease with esophagitis, without bleeding: Secondary | ICD-10-CM

## 2017-03-14 DIAGNOSIS — E876 Hypokalemia: Secondary | ICD-10-CM | POA: Diagnosis not present

## 2017-03-14 DIAGNOSIS — T502X5A Adverse effect of carbonic-anhydrase inhibitors, benzothiadiazides and other diuretics, initial encounter: Secondary | ICD-10-CM | POA: Diagnosis not present

## 2017-03-14 DIAGNOSIS — E7800 Pure hypercholesterolemia, unspecified: Secondary | ICD-10-CM | POA: Diagnosis not present

## 2017-03-14 DIAGNOSIS — M1A4710 Other secondary chronic gout, right ankle and foot, without tophus (tophi): Secondary | ICD-10-CM

## 2017-03-14 DIAGNOSIS — I10 Essential (primary) hypertension: Secondary | ICD-10-CM | POA: Diagnosis not present

## 2017-03-14 LAB — LIPID PANEL
CHOL/HDL RATIO: 3 (calc) (ref <5.0)
CHOLESTEROL: 167 mg/dL (ref <200)
HDL: 56 mg/dL (ref >50)
LDL CHOLESTEROL (CALC): 95 mg/dL
Non-HDL Cholesterol (Calc): 111 mg/dL (calc) (ref <130)
TRIGLYCERIDES: 72 mg/dL (ref <150)

## 2017-03-14 MED ORDER — OMEPRAZOLE 20 MG PO CPDR: 20.0000 mg | DELAYED_RELEASE_CAPSULE | Freq: Every day | ORAL | 1 refills | Status: DC

## 2017-03-14 MED ORDER — VALSARTAN-HYDROCHLOROTHIAZIDE 160-12.5 MG PO TABS: 1.0000 | ORAL_TABLET | Freq: Every day | ORAL | 1 refills | Status: DC

## 2017-03-14 MED ORDER — ALLOPURINOL 100 MG PO TABS: 100.0000 mg | ORAL_TABLET | Freq: Two times a day (BID) | ORAL | 1 refills | Status: DC

## 2017-03-14 MED ORDER — ATORVASTATIN CALCIUM 20 MG PO TABS: 20.0000 mg | ORAL_TABLET | Freq: Every day | ORAL | 0 refills | Status: DC

## 2017-03-14 MED ORDER — POTASSIUM CHLORIDE ER 10 MEQ PO TBCR: 10.0000 meq | EXTENDED_RELEASE_TABLET | Freq: Every day | ORAL | 0 refills | Status: DC

## 2017-03-14 NOTE — Progress Notes (Signed)
Name: Julia Herrera   MRN: [id number]    DOB: 02/03/1926   Date:03/14/2017       Progress Note  Subjective  Chief Complaint  Chief Complaint  Patient presents with  . Annual Exam    Hypertension  This is a chronic problem. The problem is unchanged. The problem is controlled. Pertinent negatives include no blurred vision, chest pain, headaches, palpitations or shortness of breath. Past treatments include angiotensin blockers, diuretics and calcium channel blockers. There is no history of kidney disease, CAD/MI or CVA.  Hyperlipidemia  This is a chronic problem. The problem is controlled. Recent lipid tests were reviewed and are normal. Pertinent negatives include no chest pain, leg pain, myalgias or shortness of breath. Current antihyperlipidemic treatment includes statins.  Gastroesophageal Reflux  She reports no abdominal pain, no chest pain, no coughing, no heartburn, no nausea or no stridor. This is a chronic problem. She has tried a PPI for the symptoms.     Past Medical History:  Diagnosis Date  . Chronic kidney disease   . Gout   . Hyperlipidemia   . Hypertension   . Osteoporosis     Past Surgical History:  Procedure Laterality Date  . CHOLECYSTECTOMY      Family History  Problem Relation Age of Onset  . Alcohol abuse Mother   . Heart attack Father     Social History   Socioeconomic History  . Marital status: Single    Spouse name: Not on file  . Number of children: Not on file  . Years of education: Not on file  . Highest education level: Not on file  Social Needs  . Financial resource strain: Not on file  . Food insecurity - worry: Not on file  . Food insecurity - inability: Not on file  . Transportation needs - medical: Not on file  . Transportation needs - non-medical: Not on file  Occupational History  . Not on file  Tobacco Use  . Smoking status: Never Smoker  . Smokeless tobacco: Current User    Types: Snuff  Substance and Sexual Activity  .  Alcohol use: No    Alcohol/week: 0.0 oz    Comment: Former  . Drug use: No  . Sexual activity: Yes    Partners: Male  Other Topics Concern  . Not on file  Social History Narrative  . Not on file     Current Outpatient Medications:  .  allopurinol (ZYLOPRIM) 100 MG tablet, Take 1 tablet (100 mg total) by mouth 2 (two) times daily., Disp: 180 tablet, Rfl: 1 .  amLODipine (NORVASC) 5 MG tablet, Take 1 tablet (5 mg total) by mouth daily., Disp: 90 tablet, Rfl: 1 .  atorvastatin (LIPITOR) 20 MG tablet, Take 1 tablet (20 mg total) by mouth at bedtime., Disp: 90 tablet, Rfl: 0 .  doxepin (SINEQUAN) 25 MG capsule, , Disp: , Rfl:  .  hydrOXYzine (ATARAX/VISTARIL) 25 MG tablet, Take 1 tablet (25 mg total) by mouth 3 (three) times daily as needed., Disp: 30 tablet, Rfl: 0 .  lidocaine (LIDODERM) 5 %, Place 1 patch onto the skin daily. Remove & Discard patch within 12 hours or as directed by MD, Disp: 5 patch, Rfl: 0 .  mometasone (ELOCON) 0.1 % cream, Apply 1 application topically daily., Disp: 15 g, Rfl: 0 .  omeprazole (PRILOSEC) 20 MG capsule, Take 1 capsule (20 mg total) by mouth daily., Disp: 90 capsule, Rfl: 1 .  potassium chloride (K-DUR) 10 MEQ tablet, TAKE  1 TABLET BY MOUTH EVERY DAY., Disp: 90 tablet, Rfl: 0 .  valsartan-hydrochlorothiazide (DIOVAN-HCT) 160-12.5 MG tablet, Take 1 tablet by mouth daily., Disp: 90 tablet, Rfl: 1 .  ZETIA 10 MG tablet, TAKE 1 TABLET BY MOUTH EVERY DAY., Disp: 30 tablet, Rfl: 0  Allergies  Allergen Reactions  . Allopurinol Other (See Comments)    dizziness  . Fosamax [Alendronate] Nausea Only  . Niaspan [Niacin Er] Other (See Comments)    unknown  . Zocor [Simvastatin] Other (See Comments)    Muscle pain     Review of Systems  Eyes: Negative for blurred vision.  Respiratory: Negative for cough and shortness of breath.   Cardiovascular: Negative for chest pain and palpitations.  Gastrointestinal: Negative for abdominal pain, heartburn and nausea.   Musculoskeletal: Negative for myalgias.  Neurological: Negative for headaches.    Objective  Vitals:   03/14/17 1142  BP: 124/72  Pulse: (!) 58  Resp: 14  Temp: 98 F (36.7 C)  TempSrc: Oral  SpO2: 95%  Weight: 133 lb 11.2 oz (60.6 kg)  Height: 5' (1.524 m)    Physical Exam  Constitutional: She is oriented to person, place, and time and well-developed, well-nourished, and in no distress.  HENT:  Head: Normocephalic and atraumatic.  Cardiovascular: Normal rate, regular rhythm and normal heart sounds.  No murmur heard. Pulmonary/Chest: Effort normal and breath sounds normal.  Abdominal: Soft. Bowel sounds are normal.  Neurological: She is alert and oriented to person, place, and time.  Psychiatric: Mood, memory, affect and judgment normal.  Nursing note and vitals reviewed.   Assessment & Plan  1. Diuretic-induced hypokalemia  - potassium chloride (K-DUR) 10 MEQ tablet; Take 1 tablet (10 mEq total) by mouth daily.  Dispense: 90 tablet; Refill: 0  2. Essential hypertension BP stable on present hypertensive treatment - valsartan-hydrochlorothiazide (DIOVAN-HCT) 160-12.5 MG tablet; Take 1 tablet by mouth daily.  Dispense: 90 tablet; Refill: 1  3. Gastroesophageal reflux disease with esophagitis Symptoms of reflux are stable on PPI - omeprazole (PRILOSEC) 20 MG capsule; Take 1 capsule (20 mg total) by mouth daily.  Dispense: 90 capsule; Refill: 1  4. 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  Dispense: 180 tablet; Refill: 1  5. Pure hypercholesterolemia  - atorvastatin (LIPITOR) 20 MG tablet; Take 1 tablet (20 mg total) by mouth at bedtime.  Dispense: 90 tablet; Refill: 0   Makenze Ellett [facility] 03/14/2017 12:04 PM

## 2017-03-14 NOTE — Addendum Note (Signed)
Addended by: Inda Coke on: 03/14/2017 02:28 PM   Modules accepted: Orders

## 2017-04-07 DIAGNOSIS — N184 Chronic kidney disease, stage 4 (severe): Secondary | ICD-10-CM | POA: Diagnosis not present

## 2017-04-07 DIAGNOSIS — I10 Essential (primary) hypertension: Secondary | ICD-10-CM | POA: Diagnosis not present

## 2017-04-13 ENCOUNTER — Telehealth: Payer: Self-pay

## 2017-04-14 NOTE — Telephone Encounter (Signed)
erroneous

## 2017-04-15 ENCOUNTER — Telehealth: Payer: Self-pay

## 2017-04-15 NOTE — Telephone Encounter (Signed)
Called the patient to informed her about her Bp medicine Valsartan-HCTZ 160-12.5mg  tab has been recall. Dr. Manuella Ghazi would like to switch the patient on Micardis-HCTZ 40-12.5 mg but would like to confirm with the patient first.

## 2017-06-08 ENCOUNTER — Ambulatory Visit (INDEPENDENT_AMBULATORY_CARE_PROVIDER_SITE_OTHER): Payer: Medicare Other | Admitting: Family Medicine

## 2017-06-08 ENCOUNTER — Telehealth: Payer: Self-pay

## 2017-06-08 ENCOUNTER — Encounter: Payer: Self-pay | Admitting: Family Medicine

## 2017-06-08 VITALS — BP 110/64 | HR 112 | Temp 98.0°F | Resp 18 | Ht 60.0 in | Wt 140.0 lb

## 2017-06-08 DIAGNOSIS — N184 Chronic kidney disease, stage 4 (severe): Secondary | ICD-10-CM | POA: Diagnosis not present

## 2017-06-08 DIAGNOSIS — M810 Age-related osteoporosis without current pathological fracture: Secondary | ICD-10-CM | POA: Diagnosis not present

## 2017-06-08 DIAGNOSIS — Z23 Encounter for immunization: Secondary | ICD-10-CM | POA: Diagnosis not present

## 2017-06-08 DIAGNOSIS — K2100 Gastro-esophageal reflux disease with esophagitis, without bleeding: Secondary | ICD-10-CM

## 2017-06-08 DIAGNOSIS — E785 Hyperlipidemia, unspecified: Secondary | ICD-10-CM | POA: Diagnosis not present

## 2017-06-08 DIAGNOSIS — F09 Unspecified mental disorder due to known physiological condition: Secondary | ICD-10-CM

## 2017-06-08 DIAGNOSIS — Z79899 Other long term (current) drug therapy: Secondary | ICD-10-CM

## 2017-06-08 DIAGNOSIS — I10 Essential (primary) hypertension: Secondary | ICD-10-CM | POA: Diagnosis not present

## 2017-06-08 NOTE — Telephone Encounter (Signed)
Left a message for Dr. Holley Raring office to call back to speak to Dr. Ancil Boozer regarding medications and kidney function.

## 2017-06-08 NOTE — Progress Notes (Signed)
Name: Julia Herrera   MRN: [id number]    DOB: 13-Apr-1925   Date:06/08/2017       Progress Note  Subjective  Chief Complaint  Chief Complaint  Patient presents with  . Medication Refill  . Pure hypercholesterolemia  . Hypertension  . Gastroesophageal Reflux    HPI  HTN: she cannot tell me what medications she takes, based on our records and nephrologist - Dr. Holley Raring, medications are not matching. She denies chest pain, SOB or palpitation  Hyperlipidemia; our records states she is on lipitor, Dr. Elwyn Lade states zetia, we will contact pharmacy. No muscle pain  GERD: she states she is not taking medication and denies heartburn or lack of appetite  CKI stage III: under the care of nephrologist, she states she has pruritus, good urine output  Osteoporosis: on previous records, I will check labs today, she is wiling to try medication but not eligible for diphosphonate because of kidney dysfunction  Patient Active Problem List   Diagnosis Date Noted  . Stage 4 chronic kidney disease ([place]) 06/08/2017  . Diuretic-induced hypokalemia 02/23/2016  . Dyshidrotic dermatitis 09/24/2015  . Hyperlipidemia 08/04/2015  . AD (atopic dermatitis) 12/23/2014  . BP (high blood pressure) 12/23/2014  . Hypertension, renal 12/23/2014  . Reflux esophagitis 12/23/2014  . Gout 12/23/2014  . Chronic kidney disease (CKD), stage III (moderate) ([place]) 12/23/2014  . Adiposity 12/23/2014  . 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 CN (constipation) 04/17/2009  . Arthritis due to gout 06/28/2007  . Osteoporosis 08/23/2006    Past Surgical History:  Procedure Laterality Date  . CHOLECYSTECTOMY      Family History  Problem Relation Age of Onset  . Alcohol abuse Mother   . Heart attack Father     Social History   Socioeconomic History  . Marital status: Single    Spouse name: Not on file  . Number of children: Not on file  . Years of education: Not on  file  . Highest education level: Not on file  Occupational History  . Not on file  Social Needs  . Financial resource strain: Not on file  . Food insecurity:    Worry: Not on file    Inability: Not on file  . Transportation needs:    Medical: Not on file    Non-medical: Not on file  Tobacco Use  . Smoking status: Never Smoker  . Smokeless tobacco: Current User    Types: Snuff  Substance and Sexual Activity  . Alcohol use: No    Alcohol/week: 0.0 oz    Comment: Former  . Drug use: No  . Sexual activity: Yes    Partners: Male  Lifestyle  . Physical activity:    Days per week: Not on file    Minutes per session: Not on file  . Stress: Not on file  Relationships  . Social connections:    Talks on phone: Not on file    Gets together: Not on file    Attends religious service: Not on file    Active member of club or organization: Not on file    Attends meetings of clubs or organizations: Not on file    Relationship status: Not on file  . Intimate partner violence:    Fear of current or ex partner: Not on file    Emotionally abused: Not on file    Physically abused: Not on file    Forced sexual activity: Not on file  Other Topics Concern  . Not on file  Social History Narrative  . Not on file     Current Outpatient Medications:  .  allopurinol (ZYLOPRIM) 100 MG tablet, Take 100 mg by mouth daily., Disp: , Rfl:  .  atorvastatin (LIPITOR) 20 MG tablet, Take 1 tablet (20 mg total) by mouth at bedtime., Disp: 90 tablet, Rfl: 0 .  omeprazole (PRILOSEC) 20 MG capsule, Take 20 mg by mouth daily., Disp: , Rfl:  .  POTASSIUM CHLORIDE PO, Take 20 mEq by mouth once., Disp: , Rfl:  .  valsartan-hydrochlorothiazide (DIOVAN-HCT) 160-12.5 MG tablet, Take 1 tablet by mouth daily., Disp: 90 tablet, Rfl: 1   It may not be the accurate list, since patient did not seem sure of what she is taking and it is not matching Dr. Elwyn Lade list. Also has allergies to allopurinol but it is getting  dispensed by pharmacy  Allergies  Allergen Reactions  . Allopurinol Other (See Comments)    dizziness  . Fosamax [Alendronate] Nausea Only  . Niaspan [Niacin Er] Other (See Comments)    unknown  . Zocor [Simvastatin] Other (See Comments)    Muscle pain     ROS  Constitutional: Negative for fever or weight change.  Respiratory: Negative for cough and shortness of breath.   Cardiovascular: Negative for chest pain or palpitations.  Gastrointestinal: Negative for abdominal pain, no bowel changes.  Musculoskeletal: Negative for gait problem or joint swelling.  Skin: Negative for rash. She has intermittent pruritus  Neurological: Negative for dizziness or headache.  No other specific complaints in a complete review of systems (except as listed in HPI above).  Objective  Vitals:   06/08/17 1120  BP: 110/64  Pulse: (!) 112  Resp: 18  Temp: 98 F (36.7 C)  TempSrc: Oral  SpO2: 97%  Weight: 140 lb (63.5 kg)  Height: 5' (1.524 m)    Body mass index is 27.34 kg/m.  Physical Exam  Constitutional: Patient appears well-developed and well-nourished. No distress.  HEENT: head atraumatic, normocephalic, pupils equal and reactive to light, neck supple, throat within normal limits Cardiovascular: Normal rate, regular rhythm and normal heart sounds.  No murmur heard. No BLE edema. Pulmonary/Chest: Effort normal and breath sounds normal. No respiratory distress. Abdominal: Soft.  There is no tenderness. Psychiatric: Patient has a normal mood and affect. behavior is normal. Unable to confirm medications she is taking, cannot recall, also unable to tell me that she sees nephrologist   Recent Results (from the past 2160 hour(s))  Lipid Profile     Status: None   Collection Time: 03/14/17  2:30 PM  Result Value Ref Range   Cholesterol 167 <200 mg/dL   HDL 56 >50 mg/dL   Triglycerides 72 <150 mg/dL   LDL Cholesterol (Calc) 95 mg/dL (calc)    Comment: Reference range:  <100 . Desirable range <100 mg/dL for primary prevention;   <70 mg/dL for patients with CHD or diabetic patients  with > or = 2 CHD risk factors. Marland Kitchen LDL-C is now calculated using the Martin-Hopkins  calculation, which is a validated novel method providing  better accuracy than the Friedewald equation in the  estimation of LDL-C.  Cresenciano Genre et al. Annamaria Helling. 9357;017(79): 2061-2068  (http://education.[facility].com/faq/FAQ164)    Total CHOL/HDL Ratio 3.0 <5.0 (calc)   Non-HDL Cholesterol (Calc) 111 <130 mg/dL (calc)    Comment: For patients with diabetes plus 1 major ASCVD risk  factor, treating to a non-HDL-C goal of <100 mg/dL  (LDL-C of <  70 mg/dL) is considered a therapeutic  option.   CBC with Differential/Platelet     Status: Abnormal   Collection Time: 06/08/17 11:58 AM  Result Value Ref Range   WBC 5.4 3.8 - 10.8 Thousand/uL   RBC 5.29 (H) 3.80 - 5.10 Million/uL   Hemoglobin 14.1 11.7 - 15.5 g/dL   HCT 43.0 35.0 - 45.0 %   MCV 81.3 80.0 - 100.0 fL   MCH 26.7 (L) 27.0 - 33.0 pg   MCHC 32.8 32.0 - 36.0 g/dL   RDW 14.8 11.0 - 15.0 %   Platelets 366 140 - 400 Thousand/uL   MPV 9.6 7.5 - 12.5 fL   Neutro Abs 2,921 1,500 - 7,800 cells/uL   Lymphs Abs 1,674 850 - 3,900 cells/uL   WBC mixed population 502 200 - 950 cells/uL   Eosinophils Absolute 265 15 - 500 cells/uL   Basophils Absolute 38 0 - 200 cells/uL   Neutrophils Relative % 54.1 %   Total Lymphocyte 31.0 %   Monocytes Relative 9.3 %   Eosinophils Relative 4.9 %   Basophils Relative 0.7 %     PHQ2/9: Depression screen [facility] 2/9 06/08/2017 03/14/2017 11/24/2016 08/26/2016 05/24/2016  Decreased Interest 0 0 0 0 0  Down, Depressed, Hopeless 0 0 0 0 0  PHQ - 2 Score 0 0 0 0 0     Fall Risk: Fall Risk  06/08/2017 03/14/2017 11/24/2016 08/26/2016 05/24/2016  Falls in the past year? No No No No No     Functional Status Survey: Is the patient deaf or have difficulty hearing?: No Does the patient have difficulty  seeing, even when wearing glasses/contacts?: No Does the patient have difficulty concentrating, remembering, or making decisions?: Yes Does the patient have difficulty walking or climbing stairs?: No Does the patient have difficulty dressing or bathing?: No Does the patient have difficulty doing errands alone such as visiting a doctor's office or shopping?: No    Assessment & Plan  1. Stage 4 chronic kidney disease ([place])  Never seen by kidney doctor - COMPLETE METABOLIC PANEL WITH GFR - VITAMIN D 25 Hydroxy (Vit-D Deficiency, Fractures)  2. Need for vaccination for pneumococcus  - Pneumococcal conjugate vaccine 13-valent IM  3. Gastroesophageal reflux disease with esophagitis  Under control, off medication   4. Hyperlipidemia, unspecified hyperlipidemia type  On statin therapy  5. Essential hypertension  - COMPLETE METABOLIC PANEL WITH GFR - TSH - CBC with Differential/Platelet  6. Osteoporosis, post-menopausal  - DG Bone Density; Future - COMPLETE METABOLIC PANEL WITH GFR - VITAMIN D 25 Hydroxy (Vit-D Deficiency, Fractures) - TSH - Parathyroid hormone, intact (no Ca) - CBC with Differential/Platelet   7. Medication management  - Ambulatory referral to [facility]  Failed clock drawing test, medication not matching our records, with pharmacy records with Dr. Elwyn Lade records.   8. Cognitive dysfunction  Failed clock drawing test done 02.22/2019 done at her home by her insurance -[employer]

## 2017-06-09 ENCOUNTER — Other Ambulatory Visit: Payer: Self-pay

## 2017-06-09 LAB — COMPLETE METABOLIC PANEL WITH GFR
AG Ratio: 1.5 (calc) (ref 1.0–2.5)
ALBUMIN MSPROF: 4.1 g/dL (ref 3.6–5.1)
ALT: 8 U/L (ref 6–29)
AST: 14 U/L (ref 10–35)
Alkaline phosphatase (APISO): 58 U/L (ref 33–130)
BUN / CREAT RATIO: 9 (calc) (ref 6–22)
BUN: 16 mg/dL (ref 7–25)
CALCIUM: 10 mg/dL (ref 8.6–10.4)
CO2: 23 mmol/L (ref 20–32)
CREATININE: 1.76 mg/dL — AB (ref 0.60–0.88)
Chloride: 106 mmol/L (ref 98–110)
GFR, EST NON AFRICAN AMERICAN: 25 mL/min/{1.73_m2} — AB (ref >=60)
GFR, Est African American: 29 mL/min/{1.73_m2} — ABNORMAL LOW (ref >=60)
GLOBULIN: 2.7 g/dL (ref 1.9–3.7)
GLUCOSE: 86 mg/dL (ref 65–99)
Potassium: 4.1 mmol/L (ref 3.5–5.3)
Sodium: 138 mmol/L (ref 135–146)
Total Bilirubin: 1.3 mg/dL — ABNORMAL HIGH (ref 0.2–1.2)
Total Protein: 6.8 g/dL (ref 6.1–8.1)

## 2017-06-09 LAB — CBC WITH DIFFERENTIAL/PLATELET
Basophils Absolute: 38 cells/uL (ref 0–200)
Basophils Relative: 0.7 %
EOS PCT: 4.9 %
Eosinophils Absolute: 265 cells/uL (ref 15–500)
HEMATOCRIT: 43 % (ref 35.0–45.0)
HEMOGLOBIN: 14.1 g/dL (ref 11.7–15.5)
LYMPHS ABS: 1674 {cells}/uL (ref 850–3900)
MCH: 26.7 pg — ABNORMAL LOW (ref 27.0–33.0)
MCHC: 32.8 g/dL (ref 32.0–36.0)
MCV: 81.3 fL (ref 80.0–100.0)
MPV: 9.6 fL (ref 7.5–12.5)
Monocytes Relative: 9.3 %
NEUTROS ABS: 2921 {cells}/uL (ref 1500–7800)
NEUTROS PCT: 54.1 %
Platelets: 366 10*3/uL (ref 140–400)
RBC: 5.29 10*6/uL — ABNORMAL HIGH (ref 3.80–5.10)
RDW: 14.8 % (ref 11.0–15.0)
Total Lymphocyte: 31 %
WBC: 5.4 10*3/uL (ref 3.8–10.8)
WBCMIX: 502 {cells}/uL (ref 200–950)

## 2017-06-09 LAB — TSH: TSH: 1.64 mIU/L (ref 0.40–4.50)

## 2017-06-09 LAB — VITAMIN D 25 HYDROXY (VIT D DEFICIENCY, FRACTURES): Vit D, 25-Hydroxy: 19 ng/mL — ABNORMAL LOW (ref 30–100)

## 2017-06-09 LAB — PARATHYROID HORMONE, INTACT (NO CA): PTH: 57 pg/mL (ref 14–64)

## 2017-09-27 ENCOUNTER — Ambulatory Visit (INDEPENDENT_AMBULATORY_CARE_PROVIDER_SITE_OTHER): Payer: Medicare Other

## 2017-09-27 VITALS — BP 110/62 | HR 100 | Temp 97.7°F | Resp 12 | Ht 60.0 in | Wt 138.1 lb

## 2017-09-27 DIAGNOSIS — Z0000 Encounter for general adult medical examination without abnormal findings: Secondary | ICD-10-CM | POA: Diagnosis not present

## 2017-09-27 NOTE — Patient Instructions (Addendum)
Julia Herrera , Thank you for taking time to come for your Medicare Wellness Visit. I appreciate your ongoing commitment to your health goals. Please review the following plan we discussed and let me know if I can assist you in the future.   Screening recommendations/referrals: Colorectal Screening: No longer required Mammogram: No longer required Bone Density: No longer required Lung Cancer Screening: You do not qualify for this screening Hepatitis C Screening: You do not qualify for this screening  Vision and Dental Exams: Recommended annual ophthalmology exams for early detection of glaucoma and other disorders of the eye Recommended annual dental exams for proper oral hygiene  Vaccinations: Influenza vaccine: Up to date Pneumococcal vaccine: Up to date Tdap vaccine: Up to date Shingles vaccine: Please call your insurance company to determine your out of pocket expense for the Shingrix vaccine. You may also receive this vaccine at your local pharmacy or Health Dept.   Advanced directives: Advance directive discussed with you today. I have provided a copy for you to complete at home and have notarized. Once this is complete please bring a copy in to our office so we can scan it into your chart.  Goals: Recommend to drink at least 6-8 8oz glasses of water per day.  Next appointment: Please schedule your Annual Wellness Visit with your Nurse Health Advisor in one year.  Preventive Care 105 Years and Older, Female Preventive care refers to lifestyle choices and visits with your health care provider that can promote health and wellness. What does preventive care include?  A yearly physical exam. This is also called an annual well check.  Dental exams once or twice a year.  Routine eye exams. Ask your health care provider how often you should have your eyes checked.  Personal lifestyle choices, including:  Daily care of your teeth and gums.  Regular physical activity.  Eating a  healthy diet.  Avoiding tobacco and drug use.  Limiting alcohol use.  Practicing safe sex.  Taking low-dose aspirin every day.  Taking vitamin and mineral supplements as recommended by your health care provider. What happens during an annual well check? The services and screenings done by your health care provider during your annual well check will depend on your age, overall health, lifestyle risk factors, and family history of disease. Counseling  Your health care provider may ask you questions about your:  Alcohol use.  Tobacco use.  Drug use.  Emotional well-being.  Home and relationship well-being.  Sexual activity.  Eating habits.  History of falls.  Memory and ability to understand (cognition).  Work and work Statistician.  Reproductive health. Screening  You may have the following tests or measurements:  Height, weight, and BMI.  Blood pressure.  Lipid and cholesterol levels. These may be checked every 5 years, or more frequently if you are over 78 years old.  Skin check.  Lung cancer screening. You may have this screening every year starting at age 65 if you have a 30-pack-year history of smoking and currently smoke or have quit within the past 15 years.  Fecal occult blood test (FOBT) of the stool. You may have this test every year starting at age 4.  Flexible sigmoidoscopy or colonoscopy. You may have a sigmoidoscopy every 5 years or a colonoscopy every 10 years starting at age 16.  Hepatitis C blood test.  Hepatitis B blood test.  Sexually transmitted disease (STD) testing.  Diabetes screening. This is done by checking your blood sugar (glucose) after you have  not eaten for a while (fasting). You may have this done every 1-3 years.  Bone density scan. This is done to screen for osteoporosis. You may have this done starting at age 83.  Mammogram. This may be done every 1-2 years. Talk to your health care provider about how often you should  have regular mammograms. Talk with your health care provider about your test results, treatment options, and if necessary, the need for more tests. Vaccines  Your health care provider may recommend certain vaccines, such as:  Influenza vaccine. This is recommended every year.  Tetanus, diphtheria, and acellular pertussis (Tdap, Td) vaccine. You may need a Td booster every 10 years.  Zoster vaccine. You may need this after age 32.  Pneumococcal 13-valent conjugate (PCV13) vaccine. One dose is recommended after age 36.  Pneumococcal polysaccharide (PPSV23) vaccine. One dose is recommended after age 29. Talk to your health care provider about which screenings and vaccines you need and how often you need them. This information is not intended to replace advice given to you by your health care provider. Make sure you discuss any questions you have with your health care provider. Document Released: 03/28/2015 Document Revised: 11/19/2015 Document Reviewed: 12/31/2014 Elsevier Interactive Patient Education  2017 [place] Prevention in the Home Falls can cause injuries. They can happen to people of all ages. There are many things you can do to make your home safe and to help prevent falls. What can I do on the outside of my home?  Regularly fix the edges of walkways and driveways and fix any cracks.  Remove anything that might make you trip as you walk through a door, such as a raised step or threshold.  Trim any bushes or trees on the path to your home.  Use bright outdoor lighting.  Clear any walking paths of anything that might make someone trip, such as rocks or tools.  Regularly check to see if handrails are loose or broken. Make sure that both sides of any steps have handrails.  Any raised decks and porches should have guardrails on the edges.  Have any leaves, snow, or ice cleared regularly.  Use sand or salt on walking paths during winter.  Clean up any spills in  your garage right away. This includes oil or grease spills. What can I do in the bathroom?  Use night lights.  Install grab bars by the toilet and in the tub and shower. Do not use towel bars as grab bars.  Use non-skid mats or decals in the tub or shower.  If you need to sit down in the shower, use a plastic, non-slip stool.  Keep the floor dry. Clean up any water that spills on the floor as soon as it happens.  Remove soap buildup in the tub or shower regularly.  Attach bath mats securely with double-sided non-slip rug tape.  Do not have throw rugs and other things on the floor that can make you trip. What can I do in the bedroom?  Use night lights.  Make sure that you have a light by your bed that is easy to reach.  Do not use any sheets or blankets that are too big for your bed. They should not hang down onto the floor.  Have a firm chair that has side arms. You can use this for support while you get dressed.  Do not have throw rugs and other things on the floor that can make you trip. What can  I do in the kitchen?  Clean up any spills right away.  Avoid walking on wet floors.  Keep items that you use a lot in easy-to-reach places.  If you need to reach something above you, use a strong step stool that has a grab bar.  Keep electrical cords out of the way.  Do not use floor polish or wax that makes floors slippery. If you must use wax, use non-skid floor wax.  Do not have throw rugs and other things on the floor that can make you trip. What can I do with my stairs?  Do not leave any items on the stairs.  Make sure that there are handrails on both sides of the stairs and use them. Fix handrails that are broken or loose. Make sure that handrails are as long as the stairways.  Check any carpeting to make sure that it is firmly attached to the stairs. Fix any carpet that is loose or worn.  Avoid having throw rugs at the top or bottom of the stairs. If you do have  throw rugs, attach them to the floor with carpet tape.  Make sure that you have a light switch at the top of the stairs and the bottom of the stairs. If you do not have them, ask someone to add them for you. What else can I do to help prevent falls?  Wear shoes that:  Do not have high heels.  Have rubber bottoms.  Are comfortable and fit you well.  Are closed at the toe. Do not wear sandals.  If you use a stepladder:  Make sure that it is fully opened. Do not climb a closed stepladder.  Make sure that both sides of the stepladder are locked into place.  Ask someone to hold it for you, if possible.  Clearly mark and make sure that you can see:  Any grab bars or handrails.  First and last steps.  Where the edge of each step is.  Use tools that help you move around (mobility aids) if they are needed. These include:  Canes.  Walkers.  Scooters.  Crutches.  Turn on the lights when you go into a dark area. Replace any light bulbs as soon as they burn out.  Set up your furniture so you have a clear path. Avoid moving your furniture around.  If any of your floors are uneven, fix them.  If there are any pets around you, be aware of where they are.  Review your medicines with your doctor. Some medicines can make you feel dizzy. This can increase your chance of falling. Ask your doctor what other things that you can do to help prevent falls. This information is not intended to replace advice given to you by your health care provider. Make sure you discuss any questions you have with your health care provider. Document Released: 12/26/2008 Document Revised: 08/07/2015 Document Reviewed: 04/05/2014 Elsevier Interactive Patient Education  2017 [employer].

## 2017-09-27 NOTE — Progress Notes (Addendum)
Subjective:   Julia Herrera is a 84 y.o. female who presents for Medicare Annual (Subsequent) preventive examination.  Review of Systems:  N/A Cardiac Risk Factors include: advanced age (>52men, >90 women);dyslipidemia;hypertension;sedentary lifestyle     Objective:     Vitals: BP 110/62 (BP Location: Left Arm, Patient Position: Sitting, Cuff Size: Normal)   Pulse 100   Temp 97.7 F (36.5 C) (Oral)   Resp 12   Ht 5' (1.524 m)   Wt 138 lb 1.6 oz (62.6 kg)   SpO2 94%   BMI 26.97 kg/m   Body mass index is 26.97 kg/m.  Advanced Directives 09/27/2017 11/24/2016 08/26/2016 05/24/2016 04/08/2016 02/23/2016 11/24/2015  Does Patient Have a Medical Advance Directive? No No No Yes Yes Yes No  Type of Advance Directive - - - Living will Living will Living will -  Does patient want to make changes to medical advance directive? - - - - - - -  Copy of [place] in Chart? - - - - - - -  Would patient like information on creating a medical advance directive? Yes (MAU/Ambulatory/Procedural Areas - Information given) - - - - - No - patient declined information    Tobacco Social History   Tobacco Use  Smoking Status Never Smoker  Smokeless Tobacco Current User  . Types: Snuff  Tobacco Comment   smoking cessation materials not required     Ready to quit: Not Answered Counseling given: Not Answered Comment: smoking cessation materials not required  Clinical Intake:  Pre-visit preparation completed: Yes  Pain : No/denies pain   BMI - recorded: 26.97 Nutritional Status: BMI 25 -29 Overweight Nutritional Risks: None Diabetes: No  How often do you need to have someone help you when you read instructions, pamphlets, or other written materials from your doctor or pharmacy?: 1 - Never  Interpreter Needed?: No  Information entered by :: AEversole, LPN  Past Medical History:  Diagnosis Date  . Chronic kidney disease   . Gout   . Hyperlipidemia   . Hypertension   .  Osteoporosis    Past Surgical History:  Procedure Laterality Date  . CHOLECYSTECTOMY     Family History  Problem Relation Age of Onset  . Alcohol abuse Mother   . Heart attack Father   . Heart disease Brother    Social History   Socioeconomic History  . Marital status: Single    Spouse name: Not on file  . Number of children: 3  . Years of education: Not on file  . Highest education level: 7th grade  Occupational History    Employer: Retired  Scientific laboratory technician  . Financial resource strain: Not hard at all  . Food insecurity:    Worry: Never true    Inability: Never true  . Transportation needs:    Medical: No    Non-medical: No  Tobacco Use  . Smoking status: Never Smoker  . Smokeless tobacco: Current User    Types: Snuff  . Tobacco comment: smoking cessation materials not required  Substance and Sexual Activity  . Alcohol use: No    Alcohol/week: 0.0 oz  . Drug use: No  . Sexual activity: Not Currently    Partners: Male  Lifestyle  . Physical activity:    Days per week: 0 days    Minutes per session: 0 min  . Stress: Not at all  Relationships  . Social connections:    Talks on phone: Patient refused    Gets  together: Patient refused    Attends religious service: Patient refused    Active member of club or organization: Patient refused    Attends meetings of clubs or organizations: Patient refused    Relationship status: Patient refused  Other Topics Concern  . Not on file  Social History Narrative  . Not on file    Outpatient Encounter Medications as of 09/27/2017  Medication Sig  . allopurinol (ZYLOPRIM) 100 MG tablet Take 100 mg by mouth daily.  Marland Kitchen amLODipine (NORVASC) 5 MG tablet Take 1 tablet by mouth daily.  Marland Kitchen atorvastatin (LIPITOR) 20 MG tablet Take 1 tablet (20 mg total) by mouth at bedtime.  Marland Kitchen omeprazole (PRILOSEC) 20 MG capsule Take 20 mg by mouth daily.  Marland Kitchen POTASSIUM CHLORIDE PO Take 20 mEq by mouth once.  . valsartan-hydrochlorothiazide  (DIOVAN-HCT) 160-12.5 MG tablet Take 1 tablet by mouth daily.   No facility-administered encounter medications on file as of 09/27/2017.     Activities of Daily Living In your present state of health, do you have any difficulty performing the following activities: 09/27/2017 06/08/2017  Hearing? N N  Comment denies hearing aids -  Vision? N N  Comment wears eyeglasses -  Difficulty concentrating or making decisions? Y Y  Comment short term memory loss -  Walking or climbing stairs? N N  Dressing or bathing? N N  Doing errands, shopping? N N  Preparing Food and eating ? N -  Comment full set upper and lower dentures -  Using the Toilet? N -  In the past six months, have you accidently leaked urine? Y -  Comment stress incontinence and urgency -  Do you have problems with loss of bowel control? N -  Managing your Medications? N -  Managing your Finances? N -  Housekeeping or managing your Housekeeping? N -  Some recent data might be hidden    Patient Care Team: Steele Sizer, MD as PCP - General (Family Medicine) Dingeldein, Remo Lipps, MD as Consulting Physician (Ophthalmology)    Assessment:   This is a routine wellness examination for [facility].  Exercise Activities and Dietary recommendations Current Exercise Habits: The patient does not participate in regular exercise at present, Exercise limited by: None identified  Goals    . DIET - INCREASE WATER INTAKE     Recommend to drink at least 6-8 8oz glasses of water per day.       Fall Risk Fall Risk  09/27/2017 06/08/2017 03/14/2017 11/24/2016 08/26/2016  Falls in the past year? No No No No No  Risk for fall due to : Impaired vision;Impaired balance/gait - - - -  Risk for fall due to: Comment wears eyeglasses; shuffling-like gait - - - -   FALL RISK PREVENTION PERTAINING TO HOME: Is your home free of loose throw rugs in walkways, pet beds, electrical cords, etc? Yes Is there adequate lighting in your home to reduce risk of  falls?  Yes Are there stairs in or around your home WITH handrails? Yes  ASSISTIVE DEVICES UTILIZED TO PREVENT FALLS: Use of a cane, walker or w/c? No Grab bars in the bathroom? No  Shower chair or a place to sit while bathing? Yes An elevated toilet seat or a handicapped toilet? No  Timed Get Up and Go Performed: Yes. Pt ambulated 10 feet within 18 sec. Gait slow, steady and without the use of an assistive device. No intervention required at this time. Fall risk prevention has been discussed.  Community Resource Referral:  Pt  declined my offer to send Community Resource Referral to Care Guide for installation of grab bars in the shower and an elevated toilet seat.  Depression Screen PHQ 2/9 Scores 09/27/2017 06/08/2017 03/14/2017 11/24/2016  PHQ - 2 Score 0 0 0 0  PHQ- 9 Score 0 - - -     Cognitive Function     6CIT Screen 09/27/2017 02/23/2016  What Year? 4 points 4 points  What month? 0 points 3 points  What time? 3 points 0 points  Count back from 20 4 points 4 points  Months in reverse 4 points 4 points  Repeat phrase 10 points 6 points  Total Score 25 21    Immunization History  Administered Date(s) Administered  . Influenza, High Dose Seasonal PF 12/23/2014, 02/23/2016, 11/24/2016  . Influenza, Seasonal, Injecte, Preservative Fre 11/11/2010, 12/24/2011  . Influenza,inj,Quad PF,6+ Mos 12/12/2012, 12/31/2013  . Pneumococcal Conjugate-13 06/08/2017  . Pneumococcal Polysaccharide-23 09/14/2011  . Pneumococcal-Unspecified 09/14/2011  . Tdap 09/14/2011    Qualifies for Shingles Vaccine? Yes. Due for Shingrix. Education has been provided regarding the importance of this vaccine. Pt has been advised to call her insurance company to determine her out of pocket expense. Advised she may also receive this vaccine at her local pharmacy or Health Dept. Verbalized acceptance and understanding.  Screening Tests Health Maintenance  Topic Date Due  . INFLUENZA VACCINE  10/13/2017    . TETANUS/TDAP  09/13/2021  . DEXA SCAN  Completed  . PNA vac Low Risk Adult  Completed    Cancer Screenings: Lung: Low Dose CT Chest recommended if Age 12-80 years, 30 pack-year currently smoking OR have quit w/in 15years. Patient does not qualify. Breast Screening: No longer required    Bone Density/Dexa: Completed 05/08/12. Ordered 06/08/17. Message sent to referral coordinator for scheduling purposes. Colorectal: No longer required  Additional Screenings: Hepatitis C Screening: Does not qualify    Plan:  I have personally reviewed and addressed the Medicare Annual Wellness questionnaire and have noted the following in the patient's chart:  A. Medical and social history B. Use of alcohol, tobacco or illicit drugs  C. Current medications and supplements D. Functional ability and status E.  Nutritional status F.  Physical activity G. Advance directives H. List of other physicians I.  Hospitalizations, surgeries, and ER visits in previous 12 months J.  [place] such as hearing and vision if needed, cognitive and depression L. Referrals and appointments  In addition, I have reviewed and discussed with patient certain preventive protocols, quality metrics, and best practice recommendations. A written personalized care plan for preventive services as well as general preventive health recommendations were provided to patient.  See attached scanned questionnaire for additional information.   Signed,  Aleatha Borer, LPN Nurse Health Advisor  I have reviewed this encounter including the documentation in this note and/or discussed this patient with the Johney Maine, FNP, NP-C. I am certifying that I agree with the content of this note as supervising physician.  Steele Sizer, MD [facility] 09/27/2017, 2:12 PM

## 2017-10-24 ENCOUNTER — Other Ambulatory Visit: Payer: Self-pay

## 2017-10-26 ENCOUNTER — Ambulatory Visit: Payer: Self-pay | Admitting: Family Medicine

## 2017-11-15 ENCOUNTER — Other Ambulatory Visit: Payer: Self-pay

## 2017-11-15 DIAGNOSIS — E7800 Pure hypercholesterolemia, unspecified: Secondary | ICD-10-CM

## 2017-11-15 MED ORDER — ATORVASTATIN CALCIUM 20 MG PO TABS: 20.0000 mg | ORAL_TABLET | Freq: Every day | ORAL | 0 refills | Status: DC

## 2017-11-21 ENCOUNTER — Other Ambulatory Visit: Payer: Self-pay

## 2017-12-06 ENCOUNTER — Encounter: Payer: Self-pay | Admitting: Family Medicine

## 2017-12-06 ENCOUNTER — Ambulatory Visit (INDEPENDENT_AMBULATORY_CARE_PROVIDER_SITE_OTHER): Payer: Medicare Other | Admitting: Family Medicine

## 2017-12-06 VITALS — BP 110/88 | HR 85 | Temp 97.7°F | Resp 14 | Ht 60.0 in | Wt 135.2 lb

## 2017-12-06 DIAGNOSIS — I10 Essential (primary) hypertension: Secondary | ICD-10-CM

## 2017-12-06 DIAGNOSIS — K2100 Gastro-esophageal reflux disease with esophagitis, without bleeding: Secondary | ICD-10-CM

## 2017-12-06 DIAGNOSIS — Z9181 History of falling: Secondary | ICD-10-CM

## 2017-12-06 DIAGNOSIS — E785 Hyperlipidemia, unspecified: Secondary | ICD-10-CM

## 2017-12-06 DIAGNOSIS — M810 Age-related osteoporosis without current pathological fracture: Secondary | ICD-10-CM

## 2017-12-06 DIAGNOSIS — F0390 Unspecified dementia without behavioral disturbance: Secondary | ICD-10-CM

## 2017-12-06 DIAGNOSIS — N184 Chronic kidney disease, stage 4 (severe): Secondary | ICD-10-CM

## 2017-12-06 MED ORDER — DONEPEZIL HCL 5 MG PO TABS: 5.0000 mg | ORAL_TABLET | Freq: Every day | ORAL | 0 refills | Status: DC

## 2017-12-06 NOTE — Progress Notes (Signed)
Name: Julia Herrera   MRN: [id number]    DOB: Oct 16, 1925   Date:12/06/2017       Progress Note  Subjective  Chief Complaint  Chief Complaint  Patient presents with  . Dementia    HPI  HTN: bp is slightly low, but stable, under the care of nephrologist  She denies chest pain, SOB or palpitation  Hyperlipidemia; daughter dispense her medications but not sure of the name and did not bring medications today    GERD: she states she is not taking medication and denies heartburn or lack of appetite. Mild weight change, just a few pounds  CKI stage IV: missed follow up with nephrologist and advised to re-schedule it she states she has pruritus, good urine output.  Osteoporosis: on previous records, she is not a candidate for diphosphonate, discussed referral to endo but needs to see nephrologist first   History of recent fall: daughter states patient fell about two weeks ago, patient states she tripped, she got up on her own, did not complain of any pain, behavior unchanged since fall  Dementia: no hallucinations or change in behavior, sleeps through the night. Daughter states just asks the same questions all the time. She cannot remember what she has just done, she recalls long term memory. She is still able to bath, dress and eat by herself. Unable to do any instrumental activities of daily living. Discussed getting labs and starting low dose medication today and they agreed    Patient Active Problem List   Diagnosis Date Noted  . Stage 4 chronic kidney disease ([place]) 06/08/2017  . Cognitive dysfunction 06/08/2017  . Osteoporosis, post-menopausal 06/08/2017  . Diuretic-induced hypokalemia 02/23/2016  . Dyshidrotic dermatitis 09/24/2015  . Hyperlipidemia 08/04/2015  . AD (atopic dermatitis) 12/23/2014  . BP (high blood pressure) 12/23/2014  . Hypertension, renal 12/23/2014  . Reflux esophagitis 12/23/2014  . Gout 12/23/2014  . Chronic kidney disease (CKD), stage III (moderate)  ([place]) 12/23/2014  . Adiposity 12/23/2014  . 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 CN (constipation) 04/17/2009  . Arthritis due to gout 06/28/2007  . Osteoporosis 08/23/2006    Past Surgical History:  Procedure Laterality Date  . CHOLECYSTECTOMY      Family History  Problem Relation Age of Onset  . Alcohol abuse Mother   . Heart attack Father   . Heart disease Brother     Social History   Socioeconomic History  . Marital status: Single    Spouse name: Not on file  . Number of children: 3  . Years of education: Not on file  . Highest education level: 7th grade  Occupational History    Employer: Retired  Scientific laboratory technician  . Financial resource strain: Not hard at all  . Food insecurity:    Worry: Never true    Inability: Never true  . Transportation needs:    Medical: No    Non-medical: No  Tobacco Use  . Smoking status: Never Smoker  . Smokeless tobacco: Current User    Types: Snuff  . Tobacco comment: smoking cessation materials not required  Substance and Sexual Activity  . Alcohol use: No    Alcohol/week: 0.0 standard drinks  . Drug use: No  . Sexual activity: Not Currently    Partners: Male  Lifestyle  . Physical activity:    Days per week: 0 days    Minutes per session: 0 min  . Stress: Not at all  Relationships  .  Social connections:    Talks on phone: Patient refused    Gets together: Patient refused    Attends religious service: Patient refused    Active member of club or organization: Patient refused    Attends meetings of clubs or organizations: Patient refused    Relationship status: Patient refused  . Intimate partner violence:    Fear of current or ex partner: No    Emotionally abused: No    Physically abused: No    Forced sexual activity: No  Other Topics Concern  . Not on file  Social History Narrative  . Not on file     Current Outpatient Medications:  .  allopurinol (ZYLOPRIM) 100  MG tablet, Take 100 mg by mouth daily., Disp: , Rfl:  .  amLODipine (NORVASC) 5 MG tablet, Take 1 tablet by mouth daily., Disp: , Rfl:  .  atorvastatin (LIPITOR) 20 MG tablet, Take 1 tablet (20 mg total) by mouth at bedtime., Disp: 90 tablet, Rfl: 0 .  omeprazole (PRILOSEC) 20 MG capsule, Take 20 mg by mouth daily., Disp: , Rfl:  .  POTASSIUM CHLORIDE PO, Take 20 mEq by mouth once., Disp: , Rfl:  .  valsartan-hydrochlorothiazide (DIOVAN-HCT) 160-12.5 MG tablet, Take 1 tablet by mouth daily., Disp: 90 tablet, Rfl: 1  Allergies  Allergen Reactions  . Allopurinol Other (See Comments)    dizziness  . Fosamax [Alendronate] Nausea Only  . Niaspan [Niacin Er] Other (See Comments)    unknown  . Zocor [Simvastatin] Other (See Comments)    Muscle pain    I personally reviewed active problem list, medication list, allergies, family history, social history with the patient/caregiver today.   ROS  Constitutional: Negative for fever or weight change.  Respiratory: Negative for cough and shortness of breath.   Cardiovascular: Negative for chest pain or palpitations.  Gastrointestinal: Negative for abdominal pain, no bowel changes.  Musculoskeletal: Negative for gait problem or joint swelling.  Skin: Negative for rash.  Neurological: Negative for dizziness or headache.  No other specific complaints in a complete review of systems (except as listed in HPI above).  Objective  Vitals:   12/06/17 1156  BP: 110/88  Pulse: 85  Resp: 14  Temp: 97.7 F (36.5 C)  TempSrc: Oral  SpO2: 97%  Weight: 135 lb 3.2 oz (61.3 kg)  Height: 5' (1.524 m)    Body mass index is 26.4 kg/m.  Physical Exam  Constitutional: Patient appears well-developed and well-nourished. Overweight. No distress.  HEENT: head atraumatic, normocephalic, pupils equal and reactive to light, neck supple, throat within normal limits Cardiovascular: Normal rate, regular rhythm and normal heart sounds.  No murmur heard. No BLE  edema. Pulmonary/Chest: Effort normal and breath sounds normal. No respiratory distress. Abdominal: Soft.  There is no tenderness. Psychiatric: Patient has a normal mood and affect. Cooperative, slow but steady gait, no tremors  PHQ2/9: Depression screen [facility] 2/9 12/06/2017 09/27/2017 06/08/2017 03/14/2017 11/24/2016  Decreased Interest 0 0 0 0 0  Down, Depressed, Hopeless 0 0 0 0 0  PHQ - 2 Score 0 0 0 0 0  Altered sleeping 0 0 - - -  Tired, decreased energy 0 0 - - -  Change in appetite 0 0 - - -  Feeling bad or failure about yourself  0 0 - - -  Trouble concentrating 0 0 - - -  Moving slowly or fidgety/restless 0 0 - - -  Suicidal thoughts 0 0 - - -  PHQ-9 Score 0 0 - - -  Difficult doing work/chores Not difficult at all Not difficult at all - - -     Fall Risk: Fall Risk  12/06/2017 09/27/2017 06/08/2017 03/14/2017 11/24/2016  Falls in the past year? Yes No No No No  Number falls in past yr: 2 or more - - - -  Injury with Fall? Yes - - - -  Risk Factor Category  High Fall Risk - - - -  Risk for fall due to : - Impaired vision;Impaired balance/gait - - -  Risk for fall due to: Comment - wears eyeglasses; shuffling-like gait - - -  Follow up Falls evaluation completed - - - -     Assessment & Plan  1. Stage 4 chronic kidney disease (HCC)  - CBC with Differential/Platelet - COMPLETE METABOLIC PANEL WITH GFR - VITAMIN D 25 Hydroxy (Vit-D Deficiency, Fractures)  2. Essential hypertension  - COMPLETE METABOLIC PANEL WITH GFR - Lipid panel  3. Osteoporosis, post-menopausal  - VITAMIN D 25 Hydroxy (Vit-D Deficiency, Fractures)  4. Gastroesophageal reflux disease with esophagitis   5. Hyperlipidemia, unspecified hyperlipidemia type  - Lipid panel  6. Dementia without behavioral disturbance, unspecified dementia type  - TSH - Vitamin B12 - RPR  7. History of recent fall  Gave information about how to avoid falls

## 2017-12-06 NOTE — Patient Instructions (Signed)

## 2017-12-07 ENCOUNTER — Encounter: Payer: Self-pay | Admitting: Family Medicine

## 2017-12-07 DIAGNOSIS — E538 Deficiency of other specified B group vitamins: Secondary | ICD-10-CM | POA: Insufficient documentation

## 2017-12-07 LAB — COMPLETE METABOLIC PANEL WITH GFR
AG Ratio: 1.5 (calc) (ref 1.0–2.5)
ALT: 5 U/L — AB (ref 6–29)
AST: 12 U/L (ref 10–35)
Albumin: 3.8 g/dL (ref 3.6–5.1)
Alkaline phosphatase (APISO): 54 U/L (ref 33–130)
BILIRUBIN TOTAL: 1 mg/dL (ref 0.2–1.2)
BUN/Creatinine Ratio: 9 (calc) (ref 6–22)
BUN: 13 mg/dL (ref 7–25)
CO2: 22 mmol/L (ref 20–32)
CREATININE: 1.42 mg/dL — AB (ref 0.60–0.88)
Calcium: 9.8 mg/dL (ref 8.6–10.4)
Chloride: 108 mmol/L (ref 98–110)
GFR, EST AFRICAN AMERICAN: 38 mL/min/{1.73_m2} — AB (ref >=60)
GFR, EST NON AFRICAN AMERICAN: 32 mL/min/{1.73_m2} — AB (ref >=60)
GLUCOSE: 89 mg/dL (ref 65–99)
Globulin: 2.5 g/dL (calc) (ref 1.9–3.7)
Potassium: 4.3 mmol/L (ref 3.5–5.3)
Sodium: 139 mmol/L (ref 135–146)
Total Protein: 6.3 g/dL (ref 6.1–8.1)

## 2017-12-07 LAB — CBC WITH DIFFERENTIAL/PLATELET
BASOS ABS: 42 {cells}/uL (ref 0–200)
BASOS PCT: 0.9 %
EOS ABS: 625 {cells}/uL — AB (ref 15–500)
Eosinophils Relative: 13.3 %
HCT: 40.3 % (ref 35.0–45.0)
Hemoglobin: 13.1 g/dL (ref 11.7–15.5)
Lymphs Abs: 1504 cells/uL (ref 850–3900)
MCH: 26.9 pg — AB (ref 27.0–33.0)
MCHC: 32.5 g/dL (ref 32.0–36.0)
MCV: 82.8 fL (ref 80.0–100.0)
MONOS PCT: 9.2 %
MPV: 10.1 fL (ref 7.5–12.5)
Neutro Abs: 2096 cells/uL (ref 1500–7800)
Neutrophils Relative %: 44.6 %
PLATELETS: 332 10*3/uL (ref 140–400)
RBC: 4.87 10*6/uL (ref 3.80–5.10)
RDW: 15.7 % — ABNORMAL HIGH (ref 11.0–15.0)
TOTAL LYMPHOCYTE: 32 %
WBC mixed population: 432 cells/uL (ref 200–950)
WBC: 4.7 10*3/uL (ref 3.8–10.8)

## 2017-12-07 LAB — RPR: RPR: NONREACTIVE

## 2017-12-07 LAB — TSH: TSH: 2.08 mIU/L (ref 0.40–4.50)

## 2017-12-07 LAB — LIPID PANEL
CHOL/HDL RATIO: 3.4 (calc) (ref <5.0)
CHOLESTEROL: 158 mg/dL (ref <200)
HDL: 47 mg/dL — ABNORMAL LOW (ref >50)
LDL CHOLESTEROL (CALC): 93 mg/dL
NON-HDL CHOLESTEROL (CALC): 111 mg/dL (ref <130)
Triglycerides: 89 mg/dL (ref <150)

## 2017-12-07 LAB — VITAMIN B12: VITAMIN B 12: 306 pg/mL (ref 200–1100)

## 2017-12-07 LAB — VITAMIN D 25 HYDROXY (VIT D DEFICIENCY, FRACTURES): VIT D 25 HYDROXY: 25 ng/mL — AB (ref 30–100)

## 2017-12-08 ENCOUNTER — Other Ambulatory Visit: Payer: Self-pay | Admitting: Family Medicine

## 2017-12-08 MED ORDER — B-12 500 MCG SL SUBL: 1.0000 | SUBLINGUAL_TABLET | Freq: Every day | SUBLINGUAL | 2 refills | Status: DC

## 2017-12-12 ENCOUNTER — Other Ambulatory Visit: Payer: Self-pay

## 2017-12-13 ENCOUNTER — Other Ambulatory Visit: Payer: Self-pay

## 2017-12-13 NOTE — Telephone Encounter (Signed)
Refill request for Hypertension medication:  Potassium Chloride 10 MEQ  Last office visit pertaining to hypertension: 12/06/2017  BP Readings from Last 3 Encounters:  12/06/17 110/88  09/27/17 110/62  06/08/17 110/64     Lab Results  Component Value Date   CREATININE 1.42 (H) 12/06/2017   BUN 13 12/06/2017   NA 139 12/06/2017   K 4.3 12/06/2017   CL 108 12/06/2017   CO2 22 12/06/2017   Please look at order. Only thing insurance would allow is a packet one dose.  Follow-ups on file. 01/13/2018

## 2017-12-20 ENCOUNTER — Other Ambulatory Visit: Payer: Self-pay

## 2017-12-20 MED ORDER — POTASSIUM CHLORIDE ER 10 MEQ PO TBCR: 10.0000 meq | EXTENDED_RELEASE_TABLET | Freq: Every day | ORAL | 0 refills | Status: DC

## 2017-12-21 ENCOUNTER — Other Ambulatory Visit: Payer: Self-pay

## 2017-12-21 MED ORDER — POTASSIUM CHLORIDE ER 10 MEQ PO TBCR: 10.0000 meq | EXTENDED_RELEASE_TABLET | Freq: Every day | ORAL | 0 refills | Status: DC

## 2017-12-21 NOTE — Telephone Encounter (Signed)
Refill request was sent to Dr. Krichna Sowles for approval and submission.  

## 2018-01-12 DIAGNOSIS — H401132 Primary open-angle glaucoma, bilateral, moderate stage: Secondary | ICD-10-CM | POA: Diagnosis not present

## 2018-01-13 ENCOUNTER — Ambulatory Visit (INDEPENDENT_AMBULATORY_CARE_PROVIDER_SITE_OTHER): Payer: Medicare Other | Admitting: Family Medicine

## 2018-01-13 ENCOUNTER — Telehealth: Payer: Self-pay | Admitting: Family Medicine

## 2018-01-13 ENCOUNTER — Encounter: Payer: Self-pay | Admitting: Family Medicine

## 2018-01-13 VITALS — BP 88/62 | HR 100 | Temp 97.4°F | Resp 20 | Ht 60.0 in | Wt 132.4 lb

## 2018-01-13 DIAGNOSIS — I10 Essential (primary) hypertension: Secondary | ICD-10-CM | POA: Diagnosis not present

## 2018-01-13 DIAGNOSIS — R296 Repeated falls: Secondary | ICD-10-CM

## 2018-01-13 DIAGNOSIS — F09 Unspecified mental disorder due to known physiological condition: Secondary | ICD-10-CM | POA: Diagnosis not present

## 2018-01-13 DIAGNOSIS — N184 Chronic kidney disease, stage 4 (severe): Secondary | ICD-10-CM | POA: Diagnosis not present

## 2018-01-13 DIAGNOSIS — F0390 Unspecified dementia without behavioral disturbance: Secondary | ICD-10-CM

## 2018-01-13 MED ORDER — DONEPEZIL HCL 10 MG PO TABS: 10.0000 mg | ORAL_TABLET | Freq: Every day | ORAL | 0 refills | Status: DC

## 2018-01-13 MED ORDER — VALSARTAN 80 MG PO TABS: 80.0000 mg | ORAL_TABLET | Freq: Every day | ORAL | 0 refills | Status: DC

## 2018-01-13 NOTE — Patient Instructions (Signed)
Most important medication is B12 and new bp medication. Diovan ( valsartan ) 80 mg daily

## 2018-01-13 NOTE — Telephone Encounter (Signed)
Copied from [place] [phone]. Topic: Quick Communication - See Telephone Encounter >> Jan 13, 2018  1:47 PM Bea Graff, NT wrote: CRM for notification. See Telephone encounter for: 01/13/18. Dawn with [employer] states with the recall, the valsartan (DIOVAN) 80 MG tablet is not available but she does have the 160 MG in stock if the dr is ok with the pt taking this and cutting the tablet in half, but a new rx will need to be sent in. Please advise. CB#: [phone]

## 2018-01-13 NOTE — Progress Notes (Signed)
Name: Julia Herrera   MRN: [id number]    DOB: 1925-03-18   Date:01/13/2018       Progress Note  Subjective  Chief Complaint  Chief Complaint  Patient presents with  . Follow-up    HPI  HTN: bp is very low and she did not take bp medications this am, we will stop diovan hctz, off norvasc, stop potassium, recheck bp at home with home health nurse and consider stopping medication depending on readings.  She has not seen  Nephrologist in months and does not want to go back.  She denies chest pain, SOB or palpitation  Hyperlipidemia; daughter dispense her medications, but currently not giving her cholesterol medication, explained that they can chose to continue or stop medication. However medication is given to decrease risk of heart attacks and strokes.   CKI stage IV: missed follow up with nephrologist and advised to re-schedule it she states she has pruritus, good urine output. Unchanged, kidney function stable based on last labs  Dementia: no hallucinations or change in behavior, sleeps through the night. Last visit her daughter Julia Herrera told me  just asks the same questions all the time. She cannot remember what she has just done, she recalls long term memory, today she came in with son Julia Herrera and he has not noticed any changes of memory with Aricept and not sure if she should continue taking it.  She is still able to bath, dress and eat by herself. Unable to do any instrumental activities of daily living.   Recurrent falls: son asked for home PT also to decrease risk   Patient Active Problem List   Diagnosis Date Noted  . Vitamin B12 deficiency 12/07/2017  . Stage 4 chronic kidney disease ([place]) 06/08/2017  . Cognitive dysfunction 06/08/2017  . Osteoporosis, post-menopausal 06/08/2017  . Diuretic-induced hypokalemia 02/23/2016  . Dyshidrotic dermatitis 09/24/2015  . Hyperlipidemia 08/04/2015  . AD (atopic dermatitis) 12/23/2014  . BP (high blood pressure) 12/23/2014  . Hypertension,  renal 12/23/2014  . Reflux esophagitis 12/23/2014  . Gout 12/23/2014  . Chronic kidney disease (CKD), stage III (moderate) ([place]) 12/23/2014  . Adiposity 12/23/2014  . 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 CN (constipation) 04/17/2009  . Arthritis due to gout 06/28/2007  . Osteoporosis 08/23/2006    Past Surgical History:  Procedure Laterality Date  . CHOLECYSTECTOMY      Family History  Problem Relation Age of Onset  . Alcohol abuse Mother   . Heart attack Father   . Heart disease Brother     Social History   Socioeconomic History  . Marital status: Single    Spouse name: Not on file  . Number of children: 3  . Years of education: Not on file  . Highest education level: 7th grade  Occupational History    Employer: Retired  Scientific laboratory technician  . Financial resource strain: Not hard at all  . Food insecurity:    Worry: Never true    Inability: Never true  . Transportation needs:    Medical: No    Non-medical: No  Tobacco Use  . Smoking status: Never Smoker  . Smokeless tobacco: Current User    Types: Snuff  . Tobacco comment: smoking cessation materials not required  Substance and Sexual Activity  . Alcohol use: No    Alcohol/week: 0.0 standard drinks  . Drug use: No  . Sexual activity: Not Currently    Partners: Male  Lifestyle  .  Physical activity:    Days per week: 0 days    Minutes per session: 0 min  . Stress: Not at all  Relationships  . Social connections:    Talks on phone: Patient refused    Gets together: Patient refused    Attends religious service: Patient refused    Active member of club or organization: Patient refused    Attends meetings of clubs or organizations: Patient refused    Relationship status: Patient refused  . Intimate partner violence:    Fear of current or ex partner: No    Emotionally abused: No    Physically abused: No    Forced sexual activity: No  Other Topics Concern  .  Not on file  Social History Narrative  . Not on file     Current Outpatient Medications:  .  allopurinol (ZYLOPRIM) 100 MG tablet, Take 100 mg by mouth 2 (two) times daily. , Disp: , Rfl:  .  atorvastatin (LIPITOR) 20 MG tablet, Take 1 tablet (20 mg total) by mouth at bedtime., Disp: 90 tablet, Rfl: 0 .  Cyanocobalamin (B-12) 500 MCG SUBL, Place 1 tablet under the tongue daily., Disp: 30 tablet, Rfl: 2 .  donepezil (ARICEPT) 5 MG tablet, Take 1 tablet (5 mg total) by mouth at bedtime., Disp: 30 tablet, Rfl: 0 .  omeprazole (PRILOSEC) 20 MG capsule, Take 20 mg by mouth as needed. , Disp: , Rfl:  .  valsartan (DIOVAN) 80 MG tablet, Take 1 tablet (80 mg total) by mouth daily., Disp: 30 tablet, Rfl: 0  Allergies  Allergen Reactions  . Allopurinol Other (See Comments)    dizziness  . Fosamax [Alendronate] Nausea Only  . Niaspan [Niacin Er] Other (See Comments)    unknown  . Zocor [Simvastatin] Other (See Comments)    Muscle pain    I personally reviewed active problem list, medication list, allergies, family history with the patient/caregiver today.   ROS  Ten systems reviewed and is negative except as mentioned in HPI   Objective  Vitals:   01/13/18 1255  BP: (!) 88/62  Pulse: 100  Resp: 20  Temp: (!) 97.4 F (36.3 C)  SpO2: 97%  Weight: 132 lb 6.4 oz (60.1 kg)  Height: 5' (1.524 m)    Body mass index is 25.86 kg/m.  Physical Exam  Constitutional: Patient appears well-developed and well-nourished.  No distress.  HEENT: head atraumatic, normocephalic, pupils equal and reactive to light,neck supple, throat within normal limits Cardiovascular: Normal rate, regular rhythm and normal heart sounds.  No murmur heard. No BLE edema. Pulmonary/Chest: Effort normal and breath sounds normal. No respiratory distress. Abdominal: Soft.  There is no tenderness. Psychiatric: Patient has a normal mood and affect. behavior is normal.   Recent Results (from the past 2160 hour(s))   CBC with Differential/Platelet     Status: Abnormal   Collection Time: 12/06/17 12:35 PM  Result Value Ref Range   WBC 4.7 3.8 - 10.8 Thousand/uL   RBC 4.87 3.80 - 5.10 Million/uL   Hemoglobin 13.1 11.7 - 15.5 g/dL   HCT 40.3 35.0 - 45.0 %   MCV 82.8 80.0 - 100.0 fL   MCH 26.9 (L) 27.0 - 33.0 pg   MCHC 32.5 32.0 - 36.0 g/dL   RDW 15.7 (H) 11.0 - 15.0 %   Platelets 332 140 - 400 Thousand/uL   MPV 10.1 7.5 - 12.5 fL   Neutro Abs 2,096 1,500 - 7,800 cells/uL   Lymphs Abs 1,504 850 - 3,900  cells/uL   WBC mixed population 432 200 - 950 cells/uL   Eosinophils Absolute 625 (H) 15 - 500 cells/uL   Basophils Absolute 42 0 - 200 cells/uL   Neutrophils Relative % 44.6 %   Total Lymphocyte 32.0 %   Monocytes Relative 9.2 %   Eosinophils Relative 13.3 %   Basophils Relative 0.9 %  COMPLETE METABOLIC PANEL WITH GFR     Status: Abnormal   Collection Time: 12/06/17 12:35 PM  Result Value Ref Range   Glucose, Bld 89 65 - 99 mg/dL    Comment: .            Fasting reference interval .    BUN 13 7 - 25 mg/dL   Creat 1.42 (H) 0.60 - 0.88 mg/dL    Comment: For patients >17 years of age, the reference limit for Creatinine is approximately 13% higher for people identified as African-American. .    GFR, Est Non African American 32 (L) > OR = 60 mL/min/1.36m2   GFR, Est African American 38 (L) > OR = 60 mL/min/1.5m2   BUN/Creatinine Ratio 9 6 - 22 (calc)   Sodium 139 135 - 146 mmol/L   Potassium 4.3 3.5 - 5.3 mmol/L   Chloride 108 98 - 110 mmol/L   CO2 22 20 - 32 mmol/L   Calcium 9.8 8.6 - 10.4 mg/dL   Total Protein 6.3 6.1 - 8.1 g/dL   Albumin 3.8 3.6 - 5.1 g/dL   Globulin 2.5 1.9 - 3.7 g/dL (calc)   AG Ratio 1.5 1.0 - 2.5 (calc)   Total Bilirubin 1.0 0.2 - 1.2 mg/dL   Alkaline phosphatase (APISO) 54 33 - 130 U/L   AST 12 10 - 35 U/L   ALT 5 (L) 6 - 29 U/L  Lipid panel     Status: Abnormal   Collection Time: 12/06/17 12:35 PM  Result Value Ref Range   Cholesterol 158 <200 mg/dL    HDL 47 (L) >50 mg/dL   Triglycerides 89 <150 mg/dL   LDL Cholesterol (Calc) 93 mg/dL (calc)    Comment: Reference range: <100 . Desirable range <100 mg/dL for primary prevention;   <70 mg/dL for patients with CHD or diabetic patients  with > or = 2 CHD risk factors. Marland Kitchen LDL-C is now calculated using the Martin-Hopkins  calculation, which is a validated novel method providing  better accuracy than the Friedewald equation in the  estimation of LDL-C.  Cresenciano Genre et al. Annamaria Helling. 5621;308(65): 2061-2068  (http://education.[facility].com/faq/FAQ164)    Total CHOL/HDL Ratio 3.4 <5.0 (calc)   Non-HDL Cholesterol (Calc) 111 <130 mg/dL (calc)    Comment: For patients with diabetes plus 1 major ASCVD risk  factor, treating to a non-HDL-C goal of <100 mg/dL  (LDL-C of <70 mg/dL) is considered a therapeutic  option.   TSH     Status: None   Collection Time: 12/06/17 12:35 PM  Result Value Ref Range   TSH 2.08 0.40 - 4.50 mIU/L  Vitamin B12     Status: None   Collection Time: 12/06/17 12:35 PM  Result Value Ref Range   Vitamin B-12 306 200 - 1,100 pg/mL    Comment: . Please Note: Although the reference range for vitamin B12 is 4062247649 pg/mL, it has been reported that between 5 and 10% of patients with values between 200 and 400 pg/mL may experience neuropsychiatric and hematologic abnormalities due to occult B12 deficiency; less than 1% of patients with values above 400 pg/mL will have symptoms. Marland Kitchen  VITAMIN D 25 Hydroxy (Vit-D Deficiency, Fractures)     Status: Abnormal   Collection Time: 12/06/17 12:35 PM  Result Value Ref Range   Vit D, 25-Hydroxy 25 (L) 30 - 100 ng/mL    Comment: Vitamin D Status         25-OH Vitamin D: . Deficiency:                    <20 ng/mL Insufficiency:             20 - 29 ng/mL Optimal:                 > or = 30 ng/mL . For 25-OH Vitamin D testing on patients on  D2-supplementation and patients for whom quantitation  of D2 and D3 fractions is  required, the [facility]ureD(TM) 25-OH VIT D, (D2,D3), LC/MS/MS is recommended: order  code [phone] (patients >10yrs). . For more information on this test, go to: http://education.[facility].com/faq/FAQ163 (This link is being provided for  informational/educational purposes only.)   RPR     Status: None   Collection Time: 12/06/17 12:35 PM  Result Value Ref Range   RPR Ser Ql NON-REACTIVE NON-REACTI     PHQ2/9: Depression screen [facility] 2/9 01/13/2018 12/06/2017 09/27/2017 06/08/2017 03/14/2017  Decreased Interest 0 0 0 0 0  Down, Depressed, Hopeless 0 0 0 0 0  PHQ - 2 Score 0 0 0 0 0  Altered sleeping 0 0 0 - -  Tired, decreased energy 0 0 0 - -  Change in appetite 0 0 0 - -  Feeling bad or failure about yourself  0 0 0 - -  Trouble concentrating 0 0 0 - -  Moving slowly or fidgety/restless 0 0 0 - -  Suicidal thoughts 0 0 0 - -  PHQ-9 Score 0 0 0 - -  Difficult doing work/chores Not difficult at all Not difficult at all Not difficult at all - -     Fall Risk: Fall Risk  01/13/2018 12/06/2017 09/27/2017 06/08/2017 03/14/2017  Falls in the past year? 0 Yes No No No  Number falls in past yr: 1 2 or more - - -  Injury with Fall? 1 Yes - - -  Risk Factor Category  High Risk (2 or more Points) High Fall Risk - - -  Risk for fall due to : History of fall(s);Medication side effect - Impaired vision;Impaired balance/gait - -  Risk for fall due to: Comment - - wears eyeglasses; shuffling-like gait - -  Follow up Education provided Falls evaluation completed - - -      Functional Status Survey: Is the patient deaf or have difficulty hearing?: No Does the patient have difficulty seeing, even when wearing glasses/contacts?: No Does the patient have difficulty concentrating, remembering, or making decisions?: No Does the patient have difficulty walking or climbing stairs?: No Does the patient have difficulty dressing or bathing?: No Does the patient have difficulty doing errands alone  such as visiting a doctor's office or shopping?: Yes    Assessment & Plan  1. Essential hypertension  - Ambulatory referral to Home Health  2. Stage 4 chronic kidney disease ([place])  Did not go back to nephrologist and not sure if she wants to , she is 84 yo, came in with her son, discussed importance of living will , son has power of attorney of health care   3. Cognitive dysfunction  - Ambulatory referral to Home Health   4. Dementia without  behavioral disturbance, unspecified dementia type ([place])  Son, decided to continue Aricept we will increase to 10 mg daily   5. Recurrent falls  Home health referral

## 2018-01-15 NOTE — Telephone Encounter (Signed)
Yes. However she is 84 yo, can it be cut before delivery? Or make sure caregiver knows instructions to avoid taking the incorrect dose. Thank you

## 2018-01-16 DIAGNOSIS — M109 Gout, unspecified: Secondary | ICD-10-CM | POA: Diagnosis not present

## 2018-01-16 DIAGNOSIS — E538 Deficiency of other specified B group vitamins: Secondary | ICD-10-CM | POA: Diagnosis not present

## 2018-01-16 DIAGNOSIS — E785 Hyperlipidemia, unspecified: Secondary | ICD-10-CM | POA: Diagnosis not present

## 2018-01-16 DIAGNOSIS — N184 Chronic kidney disease, stage 4 (severe): Secondary | ICD-10-CM | POA: Diagnosis not present

## 2018-01-16 DIAGNOSIS — M810 Age-related osteoporosis without current pathological fracture: Secondary | ICD-10-CM | POA: Diagnosis not present

## 2018-01-16 DIAGNOSIS — I129 Hypertensive chronic kidney disease with stage 1 through stage 4 chronic kidney disease, or unspecified chronic kidney disease: Secondary | ICD-10-CM | POA: Diagnosis not present

## 2018-01-16 DIAGNOSIS — K210 Gastro-esophageal reflux disease with esophagitis: Secondary | ICD-10-CM | POA: Diagnosis not present

## 2018-01-16 DIAGNOSIS — Z9181 History of falling: Secondary | ICD-10-CM | POA: Diagnosis not present

## 2018-01-16 MED ORDER — VALSARTAN 160 MG PO TABS: 80.0000 mg | ORAL_TABLET | Freq: Every day | ORAL | 0 refills | Status: DC

## 2018-01-16 NOTE — Telephone Encounter (Signed)
Spoke with Dawn at [facility] [facility] [employer] and she states she is able to cut them in half before delivering her medication to her. However she is asking we electronically sent in a new prescription with Valsartan 160 mg take half daily. Tee'd up new prescription, please sign.

## 2018-01-19 DIAGNOSIS — I129 Hypertensive chronic kidney disease with stage 1 through stage 4 chronic kidney disease, or unspecified chronic kidney disease: Secondary | ICD-10-CM | POA: Diagnosis not present

## 2018-01-19 DIAGNOSIS — N184 Chronic kidney disease, stage 4 (severe): Secondary | ICD-10-CM | POA: Diagnosis not present

## 2018-01-19 DIAGNOSIS — M810 Age-related osteoporosis without current pathological fracture: Secondary | ICD-10-CM | POA: Diagnosis not present

## 2018-01-19 DIAGNOSIS — K210 Gastro-esophageal reflux disease with esophagitis: Secondary | ICD-10-CM | POA: Diagnosis not present

## 2018-01-19 DIAGNOSIS — E785 Hyperlipidemia, unspecified: Secondary | ICD-10-CM | POA: Diagnosis not present

## 2018-01-19 DIAGNOSIS — M109 Gout, unspecified: Secondary | ICD-10-CM | POA: Diagnosis not present

## 2018-01-19 DIAGNOSIS — E538 Deficiency of other specified B group vitamins: Secondary | ICD-10-CM | POA: Diagnosis not present

## 2018-01-19 DIAGNOSIS — Z9181 History of falling: Secondary | ICD-10-CM | POA: Diagnosis not present

## 2018-01-23 DIAGNOSIS — N184 Chronic kidney disease, stage 4 (severe): Secondary | ICD-10-CM | POA: Diagnosis not present

## 2018-01-23 DIAGNOSIS — E538 Deficiency of other specified B group vitamins: Secondary | ICD-10-CM | POA: Diagnosis not present

## 2018-01-23 DIAGNOSIS — E785 Hyperlipidemia, unspecified: Secondary | ICD-10-CM | POA: Diagnosis not present

## 2018-01-23 DIAGNOSIS — I129 Hypertensive chronic kidney disease with stage 1 through stage 4 chronic kidney disease, or unspecified chronic kidney disease: Secondary | ICD-10-CM | POA: Diagnosis not present

## 2018-01-23 DIAGNOSIS — K210 Gastro-esophageal reflux disease with esophagitis: Secondary | ICD-10-CM | POA: Diagnosis not present

## 2018-01-23 DIAGNOSIS — M810 Age-related osteoporosis without current pathological fracture: Secondary | ICD-10-CM | POA: Diagnosis not present

## 2018-01-23 DIAGNOSIS — M109 Gout, unspecified: Secondary | ICD-10-CM | POA: Diagnosis not present

## 2018-01-23 DIAGNOSIS — Z9181 History of falling: Secondary | ICD-10-CM | POA: Diagnosis not present

## 2018-01-26 DIAGNOSIS — N184 Chronic kidney disease, stage 4 (severe): Secondary | ICD-10-CM | POA: Diagnosis not present

## 2018-01-26 DIAGNOSIS — Z9181 History of falling: Secondary | ICD-10-CM | POA: Diagnosis not present

## 2018-01-26 DIAGNOSIS — E538 Deficiency of other specified B group vitamins: Secondary | ICD-10-CM | POA: Diagnosis not present

## 2018-01-26 DIAGNOSIS — E785 Hyperlipidemia, unspecified: Secondary | ICD-10-CM | POA: Diagnosis not present

## 2018-01-26 DIAGNOSIS — M109 Gout, unspecified: Secondary | ICD-10-CM | POA: Diagnosis not present

## 2018-01-26 DIAGNOSIS — K210 Gastro-esophageal reflux disease with esophagitis: Secondary | ICD-10-CM | POA: Diagnosis not present

## 2018-01-26 DIAGNOSIS — M810 Age-related osteoporosis without current pathological fracture: Secondary | ICD-10-CM | POA: Diagnosis not present

## 2018-01-26 DIAGNOSIS — I129 Hypertensive chronic kidney disease with stage 1 through stage 4 chronic kidney disease, or unspecified chronic kidney disease: Secondary | ICD-10-CM | POA: Diagnosis not present

## 2018-01-30 ENCOUNTER — Ambulatory Visit: Payer: Self-pay | Admitting: *Deleted

## 2018-01-30 NOTE — Telephone Encounter (Signed)
Contacted pt regarding symptoms; she states that she having severe itching on her back, arms, and stomach that started 01/28/18; recommendations made per nurse triage protocol;  she had an appointment on 02/01/18 with Dr Ancil Boozer  but cancelled due to transportation issues;the pt would like the medication per  Dr Rutherford Nail sent her to in  2017, triamicinolone cream; the pt uses [facility]; the best contact number is [phone]; will route to office for final disposition.  Answer Assessment - Initial Assessment Questions 1. DESCRIPTION: "Describe the itching you are having." "Where is it located?"     Bilateral legs, arms,  and stomach 2. SEVERITY: "How bad is it?"    - MILD - doesn't interfere with normal activities   - MODERATE - SEVERE: interferes with work, school, sleep, or other activities      severe 3. SCRATCHING: "Are there any scratch marks? Bleeding?"     White bumps come up when she scratches too much 4. ONSET: "When did the itching begin?"     01/28/18 5. CAUSE: "What do you think is causing the itching?"      Seen by dermatology in [facility] and given a creqm 6. OTHER SYMPTOMS: "Do you have any other symptoms?"      no 7. PREGNANCY: "Is there any chance you are pregnant?" "When was your last menstrual period?"     no  Protocols used: ITCHING - LOCALIZED-A-AH

## 2018-01-30 NOTE — Telephone Encounter (Signed)
Attempted to contact pt but automated message states that this number is no longer in service

## 2018-01-31 ENCOUNTER — Telehealth: Payer: Self-pay

## 2018-01-31 DIAGNOSIS — N184 Chronic kidney disease, stage 4 (severe): Secondary | ICD-10-CM | POA: Diagnosis not present

## 2018-01-31 DIAGNOSIS — M810 Age-related osteoporosis without current pathological fracture: Secondary | ICD-10-CM | POA: Diagnosis not present

## 2018-01-31 DIAGNOSIS — Z9181 History of falling: Secondary | ICD-10-CM | POA: Diagnosis not present

## 2018-01-31 DIAGNOSIS — I129 Hypertensive chronic kidney disease with stage 1 through stage 4 chronic kidney disease, or unspecified chronic kidney disease: Secondary | ICD-10-CM | POA: Diagnosis not present

## 2018-01-31 DIAGNOSIS — E538 Deficiency of other specified B group vitamins: Secondary | ICD-10-CM | POA: Diagnosis not present

## 2018-01-31 DIAGNOSIS — K210 Gastro-esophageal reflux disease with esophagitis: Secondary | ICD-10-CM | POA: Diagnosis not present

## 2018-01-31 DIAGNOSIS — M109 Gout, unspecified: Secondary | ICD-10-CM | POA: Diagnosis not present

## 2018-01-31 DIAGNOSIS — E785 Hyperlipidemia, unspecified: Secondary | ICD-10-CM | POA: Diagnosis not present

## 2018-01-31 NOTE — Telephone Encounter (Signed)
Julia Herrera, her daughter-in-law states [name] goal through episode of agitation, gets combative and they have contacted the police to take her to [facility] but they cannot take her involuntarily. They are trying to get her in a nursing home now

## 2018-01-31 NOTE — Telephone Encounter (Signed)
Farrel Demark brought in some paperwork to have completed by Dr. Ancil Boozer regarding her mother-in-law.  Form was given to Dr. Ancil Boozer and she consulted with the daughter-in-law to find out why she needed this paperwork completed.

## 2018-02-01 ENCOUNTER — Ambulatory Visit: Payer: Self-pay | Admitting: Family Medicine

## 2018-02-02 DIAGNOSIS — E785 Hyperlipidemia, unspecified: Secondary | ICD-10-CM | POA: Diagnosis not present

## 2018-02-02 DIAGNOSIS — E538 Deficiency of other specified B group vitamins: Secondary | ICD-10-CM | POA: Diagnosis not present

## 2018-02-02 DIAGNOSIS — M109 Gout, unspecified: Secondary | ICD-10-CM | POA: Diagnosis not present

## 2018-02-02 DIAGNOSIS — K210 Gastro-esophageal reflux disease with esophagitis: Secondary | ICD-10-CM | POA: Diagnosis not present

## 2018-02-02 DIAGNOSIS — M810 Age-related osteoporosis without current pathological fracture: Secondary | ICD-10-CM | POA: Diagnosis not present

## 2018-02-02 DIAGNOSIS — N184 Chronic kidney disease, stage 4 (severe): Secondary | ICD-10-CM | POA: Diagnosis not present

## 2018-02-02 DIAGNOSIS — Z9181 History of falling: Secondary | ICD-10-CM | POA: Diagnosis not present

## 2018-02-02 DIAGNOSIS — I129 Hypertensive chronic kidney disease with stage 1 through stage 4 chronic kidney disease, or unspecified chronic kidney disease: Secondary | ICD-10-CM | POA: Diagnosis not present

## 2018-02-03 DIAGNOSIS — N184 Chronic kidney disease, stage 4 (severe): Secondary | ICD-10-CM | POA: Diagnosis not present

## 2018-02-03 DIAGNOSIS — I129 Hypertensive chronic kidney disease with stage 1 through stage 4 chronic kidney disease, or unspecified chronic kidney disease: Secondary | ICD-10-CM | POA: Diagnosis not present

## 2018-02-07 DIAGNOSIS — Z9181 History of falling: Secondary | ICD-10-CM | POA: Diagnosis not present

## 2018-02-07 DIAGNOSIS — E785 Hyperlipidemia, unspecified: Secondary | ICD-10-CM | POA: Diagnosis not present

## 2018-02-07 DIAGNOSIS — N184 Chronic kidney disease, stage 4 (severe): Secondary | ICD-10-CM | POA: Diagnosis not present

## 2018-02-07 DIAGNOSIS — M109 Gout, unspecified: Secondary | ICD-10-CM | POA: Diagnosis not present

## 2018-02-07 DIAGNOSIS — I129 Hypertensive chronic kidney disease with stage 1 through stage 4 chronic kidney disease, or unspecified chronic kidney disease: Secondary | ICD-10-CM | POA: Diagnosis not present

## 2018-02-07 DIAGNOSIS — M810 Age-related osteoporosis without current pathological fracture: Secondary | ICD-10-CM | POA: Diagnosis not present

## 2018-02-07 DIAGNOSIS — E538 Deficiency of other specified B group vitamins: Secondary | ICD-10-CM | POA: Diagnosis not present

## 2018-02-07 DIAGNOSIS — K210 Gastro-esophageal reflux disease with esophagitis: Secondary | ICD-10-CM | POA: Diagnosis not present

## 2018-02-15 DIAGNOSIS — N184 Chronic kidney disease, stage 4 (severe): Secondary | ICD-10-CM | POA: Diagnosis not present

## 2018-02-15 DIAGNOSIS — E785 Hyperlipidemia, unspecified: Secondary | ICD-10-CM | POA: Diagnosis not present

## 2018-02-15 DIAGNOSIS — K210 Gastro-esophageal reflux disease with esophagitis: Secondary | ICD-10-CM | POA: Diagnosis not present

## 2018-02-15 DIAGNOSIS — I129 Hypertensive chronic kidney disease with stage 1 through stage 4 chronic kidney disease, or unspecified chronic kidney disease: Secondary | ICD-10-CM | POA: Diagnosis not present

## 2018-02-15 DIAGNOSIS — Z9181 History of falling: Secondary | ICD-10-CM | POA: Diagnosis not present

## 2018-02-15 DIAGNOSIS — M109 Gout, unspecified: Secondary | ICD-10-CM | POA: Diagnosis not present

## 2018-02-15 DIAGNOSIS — E538 Deficiency of other specified B group vitamins: Secondary | ICD-10-CM | POA: Diagnosis not present

## 2018-02-15 DIAGNOSIS — M810 Age-related osteoporosis without current pathological fracture: Secondary | ICD-10-CM | POA: Diagnosis not present

## 2018-02-16 ENCOUNTER — Emergency Department: Payer: Medicare Other

## 2018-02-16 ENCOUNTER — Observation Stay
Admission: EM | Admit: 2018-02-16 | Discharge: 2018-02-17 | Disposition: A | Discharge status: Home Health | Payer: Medicare Other | Attending: Internal Medicine | Admitting: Internal Medicine

## 2018-02-16 ENCOUNTER — Other Ambulatory Visit: Payer: Self-pay

## 2018-02-16 DIAGNOSIS — R7989 Other specified abnormal findings of blood chemistry: Secondary | ICD-10-CM | POA: Insufficient documentation

## 2018-02-16 DIAGNOSIS — N179 Acute kidney failure, unspecified: Secondary | ICD-10-CM | POA: Insufficient documentation

## 2018-02-16 DIAGNOSIS — E785 Hyperlipidemia, unspecified: Secondary | ICD-10-CM | POA: Insufficient documentation

## 2018-02-16 DIAGNOSIS — E860 Dehydration: Secondary | ICD-10-CM | POA: Insufficient documentation

## 2018-02-16 DIAGNOSIS — R918 Other nonspecific abnormal finding of lung field: Secondary | ICD-10-CM | POA: Diagnosis not present

## 2018-02-16 DIAGNOSIS — R4182 Altered mental status, unspecified: Secondary | ICD-10-CM | POA: Diagnosis not present

## 2018-02-16 DIAGNOSIS — I259 Chronic ischemic heart disease, unspecified: Secondary | ICD-10-CM | POA: Diagnosis not present

## 2018-02-16 DIAGNOSIS — Z7982 Long term (current) use of aspirin: Secondary | ICD-10-CM | POA: Insufficient documentation

## 2018-02-16 DIAGNOSIS — R410 Disorientation, unspecified: Secondary | ICD-10-CM | POA: Diagnosis not present

## 2018-02-16 DIAGNOSIS — M25551 Pain in right hip: Secondary | ICD-10-CM

## 2018-02-16 DIAGNOSIS — Z8249 Family history of ischemic heart disease and other diseases of the circulatory system: Secondary | ICD-10-CM | POA: Insufficient documentation

## 2018-02-16 DIAGNOSIS — S0990XA Unspecified injury of head, initial encounter: Secondary | ICD-10-CM | POA: Diagnosis not present

## 2018-02-16 DIAGNOSIS — I1310 Hypertensive heart and chronic kidney disease without heart failure, with stage 1 through stage 4 chronic kidney disease, or unspecified chronic kidney disease: Secondary | ICD-10-CM | POA: Diagnosis not present

## 2018-02-16 DIAGNOSIS — M810 Age-related osteoporosis without current pathological fracture: Secondary | ICD-10-CM | POA: Diagnosis not present

## 2018-02-16 DIAGNOSIS — S4991XA Unspecified injury of right shoulder and upper arm, initial encounter: Secondary | ICD-10-CM | POA: Diagnosis not present

## 2018-02-16 DIAGNOSIS — D649 Anemia, unspecified: Secondary | ICD-10-CM | POA: Diagnosis not present

## 2018-02-16 DIAGNOSIS — N183 Chronic kidney disease, stage 3 (moderate): Secondary | ICD-10-CM | POA: Diagnosis not present

## 2018-02-16 DIAGNOSIS — F0390 Unspecified dementia without behavioral disturbance: Secondary | ICD-10-CM | POA: Insufficient documentation

## 2018-02-16 DIAGNOSIS — M25511 Pain in right shoulder: Secondary | ICD-10-CM | POA: Diagnosis not present

## 2018-02-16 DIAGNOSIS — M109 Gout, unspecified: Secondary | ICD-10-CM | POA: Insufficient documentation

## 2018-02-16 DIAGNOSIS — R55 Syncope and collapse: Principal | ICD-10-CM

## 2018-02-16 DIAGNOSIS — I214 Non-ST elevation (NSTEMI) myocardial infarction: Secondary | ICD-10-CM

## 2018-02-16 DIAGNOSIS — R0789 Other chest pain: Secondary | ICD-10-CM | POA: Diagnosis not present

## 2018-02-16 DIAGNOSIS — S79911A Unspecified injury of right hip, initial encounter: Secondary | ICD-10-CM | POA: Diagnosis not present

## 2018-02-16 DIAGNOSIS — R1084 Generalized abdominal pain: Secondary | ICD-10-CM | POA: Diagnosis not present

## 2018-02-16 DIAGNOSIS — E876 Hypokalemia: Secondary | ICD-10-CM

## 2018-02-16 DIAGNOSIS — Z79899 Other long term (current) drug therapy: Secondary | ICD-10-CM | POA: Insufficient documentation

## 2018-02-16 DIAGNOSIS — R079 Chest pain, unspecified: Secondary | ICD-10-CM | POA: Diagnosis not present

## 2018-02-16 HISTORY — DX: Unspecified dementia, unspecified severity, without behavioral disturbance, psychotic disturbance, mood disturbance, and anxiety: F03.90

## 2018-02-16 LAB — BASIC METABOLIC PANEL
Anion gap: 9 (ref 5–15)
BUN: 8 mg/dL (ref 8–23)
CO2: 25 mmol/L (ref 22–32)
Calcium: 9.1 mg/dL (ref 8.9–10.3)
Chloride: 110 mmol/L (ref 98–111)
Creatinine, Ser: 1.58 mg/dL — ABNORMAL HIGH (ref 0.44–1.00)
GFR calc Af Amer: 33 mL/min — ABNORMAL LOW (ref >60)
GFR calc non Af Amer: 29 mL/min — ABNORMAL LOW (ref >60)
Glucose, Bld: 99 mg/dL (ref 70–99)
Potassium: 2.9 mmol/L — ABNORMAL LOW (ref 3.5–5.1)
Sodium: 144 mmol/L (ref 135–145)

## 2018-02-16 LAB — CBC WITH DIFFERENTIAL/PLATELET
Abs Immature Granulocytes: 0.02 10*3/uL (ref 0.00–0.07)
Basophils Absolute: 0.1 10*3/uL (ref 0.0–0.1)
Basophils Relative: 1 %
Eosinophils Absolute: 0.3 10*3/uL (ref 0.0–0.5)
Eosinophils Relative: 6 %
HCT: 36.7 % (ref 36.0–46.0)
Hemoglobin: 10.9 g/dL — ABNORMAL LOW (ref 12.0–15.0)
Immature Granulocytes: 0 %
LYMPHS ABS: 1.3 10*3/uL (ref 0.7–4.0)
Lymphocytes Relative: 28 %
MCH: 25.6 pg — ABNORMAL LOW (ref 26.0–34.0)
MCHC: 29.7 g/dL — ABNORMAL LOW (ref 30.0–36.0)
MCV: 86.2 fL (ref 80.0–100.0)
MONOS PCT: 11 %
Monocytes Absolute: 0.5 10*3/uL (ref 0.1–1.0)
Neutro Abs: 2.4 10*3/uL (ref 1.7–7.7)
Neutrophils Relative %: 54 %
Platelets: 335 10*3/uL (ref 150–400)
RBC: 4.26 MIL/uL (ref 3.87–5.11)
RDW: 16.9 % — ABNORMAL HIGH (ref 11.5–15.5)
WBC: 4.5 10*3/uL (ref 4.0–10.5)
nRBC: 0 % (ref 0.0–0.2)

## 2018-02-16 LAB — TROPONIN I
Troponin I: 0.06 ng/mL (ref <0.03)
Troponin I: <0.03 ng/mL (ref <0.03)

## 2018-02-16 LAB — GLUCOSE, CAPILLARY: GLUCOSE-CAPILLARY: 108 mg/dL — AB (ref 70–99)

## 2018-02-16 LAB — MAGNESIUM: Magnesium: 2 mg/dL (ref 1.7–2.4)

## 2018-02-16 MED ORDER — POTASSIUM CHLORIDE CRYS ER 20 MEQ PO TBCR
20.0000 meq | EXTENDED_RELEASE_TABLET | Freq: Two times a day (BID) | ORAL | Status: DC
  Administered 2018-02-16 – 2018-02-17 (×2): 20 meq via ORAL
  Filled 2018-02-16 (×3): qty 1

## 2018-02-16 MED ORDER — ATORVASTATIN CALCIUM 20 MG PO TABS
20.0000 mg | ORAL_TABLET | Freq: Every day | ORAL | Status: DC
  Administered 2018-02-16: 20 mg via ORAL
  Filled 2018-02-16: qty 1

## 2018-02-16 MED ORDER — SODIUM CHLORIDE 0.9 % IV SOLN
INTRAVENOUS | Status: DC
  Administered 2018-02-16: 22:00:00 via INTRAVENOUS

## 2018-02-16 MED ORDER — DONEPEZIL HCL 5 MG PO TABS
10.0000 mg | ORAL_TABLET | Freq: Every day | ORAL | Status: DC
  Administered 2018-02-16: 10 mg via ORAL
  Filled 2018-02-16 (×2): qty 2

## 2018-02-16 MED ORDER — ASPIRIN 81 MG PO CHEW
324.0000 mg | CHEWABLE_TABLET | Freq: Once | ORAL | Status: AC
  Administered 2018-02-16: 324 mg via ORAL
  Filled 2018-02-16: qty 4

## 2018-02-16 MED ORDER — ALLOPURINOL 100 MG PO TABS
100.0000 mg | ORAL_TABLET | Freq: Two times a day (BID) | ORAL | Status: DC
  Administered 2018-02-16 – 2018-02-17 (×2): 100 mg via ORAL
  Filled 2018-02-16 (×3): qty 1

## 2018-02-16 MED ORDER — ENOXAPARIN SODIUM 30 MG/0.3ML ~~LOC~~ SOLN
30.0000 mg | SUBCUTANEOUS | Status: DC
  Administered 2018-02-16: 30 mg via SUBCUTANEOUS
  Filled 2018-02-16: qty 0.3

## 2018-02-16 MED ORDER — ACETAMINOPHEN 325 MG PO TABS: 650.0000 mg | ORAL_TABLET | Freq: Four times a day (QID) | ORAL | Status: DC | PRN

## 2018-02-16 MED ORDER — VITAMIN B-12 1000 MCG PO TABS
500.0000 ug | ORAL_TABLET | Freq: Every day | ORAL | Status: DC
  Administered 2018-02-17: 500 ug via ORAL
  Filled 2018-02-16: qty 1

## 2018-02-16 MED ORDER — ASPIRIN EC 81 MG PO TBEC
81.0000 mg | DELAYED_RELEASE_TABLET | Freq: Every day | ORAL | Status: DC
  Administered 2018-02-17: 81 mg via ORAL
  Filled 2018-02-16: qty 1

## 2018-02-16 MED ORDER — PANTOPRAZOLE SODIUM 40 MG PO TBEC
40.0000 mg | DELAYED_RELEASE_TABLET | Freq: Every day | ORAL | Status: DC
  Administered 2018-02-17: 40 mg via ORAL
  Filled 2018-02-16: qty 1

## 2018-02-16 MED ORDER — ACETAMINOPHEN 500 MG PO TABS
1000.0000 mg | ORAL_TABLET | Freq: Once | ORAL | Status: AC
  Administered 2018-02-16: 1000 mg via ORAL
  Filled 2018-02-16: qty 2

## 2018-02-16 MED ORDER — ONDANSETRON HCL 4 MG/2ML IJ SOLN: 4.0000 mg | Freq: Four times a day (QID) | INTRAMUSCULAR | Status: DC | PRN

## 2018-02-16 MED ORDER — ONDANSETRON HCL 4 MG PO TABS: 4.0000 mg | ORAL_TABLET | Freq: Four times a day (QID) | ORAL | Status: DC | PRN

## 2018-02-16 MED ORDER — ACETAMINOPHEN 650 MG RE SUPP: 650.0000 mg | Freq: Four times a day (QID) | RECTAL | Status: DC | PRN

## 2018-02-16 MED ORDER — POTASSIUM CHLORIDE 20 MEQ PO PACK
40.0000 meq | PACK | Freq: Two times a day (BID) | ORAL | Status: DC
  Administered 2018-02-16: 40 meq via ORAL
  Filled 2018-02-16: qty 2

## 2018-02-16 NOTE — H&P (Signed)
[facility] [place] NAME: Julia Herrera    MR#:  [id number]  DATE OF BIRTH:  Feb 18, 1926  DATE OF ADMISSION:  02/16/2018  PRIMARY CARE PHYSICIAN: Steele Sizer, MD   REQUESTING/REFERRING PHYSICIAN: Dr. Rudene Re  CHIEF COMPLAINT:   Chief Complaint  Patient presents with  . Altered Mental Status    HISTORY OF PRESENT ILLNESS:  Julia Herrera  is a 84 y.o. female with a known history of chronic kidney disease stage III, dementia, gout, hypertension, hyperlipidemia, osteoporosis who presents to the hospital after a syncopal episode and noted to have a mildly elevated troponin with hypokalemia.  Patient was in her usual state of health and was walking to her mailbox when had a syncopal episode.  She describes a feeling as her legs gave way and she fell.  She denies any prodromal symptoms of palpitations, nausea, vomiting or any other associated symptoms.  She did say that she had some vague chest pain but she is not a reliable historian.  Her neighbors noticed her fall and have a syncopal episode and therefore called EMS and she was brought to the ER for further evaluation.  In the emergency room patient on routine blood work was noted to have a mildly elevated troponin at 0.06.  Hospitalist services were contacted for admission.  Patient presently denies any chest pains, shortness of breath, nausea, vomiting, dizziness, headache or any other associated symptoms presently.  Patient denies ever previously having a syncopal episode or any cardiac history.  PAST MEDICAL HISTORY:   Past Medical History:  Diagnosis Date  . Chronic kidney disease   . Dementia ([place])   . Gout   . Hyperlipidemia   . Hypertension   . Osteoporosis     PAST SURGICAL HISTORY:   Past Surgical History:  Procedure Laterality Date  . CHOLECYSTECTOMY      SOCIAL HISTORY:   Social History   Tobacco Use  . Smoking status: Never Smoker  . Smokeless tobacco: Current  User    Types: Snuff  . Tobacco comment: smoking cessation materials not required  Substance Use Topics  . Alcohol use: No    Alcohol/week: 0.0 standard drinks    FAMILY HISTORY:   Family History  Problem Relation Age of Onset  . Alcohol abuse Mother   . Heart attack Father   . Heart disease Brother     DRUG ALLERGIES:   Allergies  Allergen Reactions  . Allopurinol Other (See Comments)    dizziness  . Fosamax [Alendronate] Nausea Only  . Niaspan [Niacin Er] Other (See Comments)    unknown  . Zocor [Simvastatin] Other (See Comments)    Muscle pain    REVIEW OF SYSTEMS:   Review of Systems  Constitutional: Negative for fever and weight loss.  HENT: Negative for congestion, nosebleeds and tinnitus.   Eyes: Negative for blurred vision, double vision and redness.  Respiratory: Negative for cough, hemoptysis and shortness of breath.   Cardiovascular: Positive for chest pain. Negative for orthopnea, leg swelling and PND.  Gastrointestinal: Positive for nausea. Negative for abdominal pain, diarrhea, melena and vomiting.  Genitourinary: Negative for dysuria, hematuria and urgency.  Musculoskeletal: Negative for falls and joint pain.  Neurological: Negative for dizziness, tingling, sensory change, focal weakness, seizures, weakness and headaches.  Endo/Heme/Allergies: Negative for polydipsia. Does not bruise/bleed easily.  Psychiatric/Behavioral: Negative for depression and memory loss. The patient is not nervous/anxious.     MEDICATIONS AT HOME:   Prior  to Admission medications   Medication Sig Start Date End Date Taking? Authorizing Provider  allopurinol (ZYLOPRIM) 100 MG tablet Take 100 mg by mouth 2 (two) times daily.    Yes [provider]  aspirin EC 81 MG tablet Take 81 mg by mouth daily.   Yes [provider]  atorvastatin (LIPITOR) 20 MG tablet Take 1 tablet (20 mg total) by mouth at bedtime. 11/15/17  Yes Sowles, Drue Stager, MD  Cyanocobalamin (B-12)  500 MCG SUBL Place 1 tablet under the tongue daily. 12/08/17  Yes Sowles, Drue Stager, MD  donepezil (ARICEPT) 10 MG tablet Take 1 tablet (10 mg total) by mouth at bedtime. 01/13/18  Yes Sowles, Drue Stager, MD  omeprazole (PRILOSEC) 20 MG capsule Take 20 mg by mouth as needed.    Yes [provider]  valsartan (DIOVAN) 160 MG tablet Take 0.5 tablets (80 mg total) by mouth daily. To equal 80 mg daily 01/16/18  Yes Sowles, Drue Stager, MD      VITAL SIGNS:  Blood pressure (!) 158/83, pulse 74, temperature 98.1 F (36.7 C), resp. rate (!) 24, height 5\' 3"  (1.6 m), weight 63.5 kg, SpO2 100 %.  PHYSICAL EXAMINATION:  Physical Exam  GENERAL:  84 y.o.-year-old patient lying in bed in no acute distress.  EYES: Pupils equal, round, reactive to light and accommodation. No scleral icterus. Extraocular muscles intact.  HEENT: Head atraumatic, normocephalic. Oropharynx and nasopharynx clear. No oropharyngeal erythema, moist oral mucosa  NECK:  Supple, no jugular venous distention. No thyroid enlargement, no tenderness.  LUNGS: Normal breath sounds bilaterally, no wheezing, rales, rhonchi. No use of accessory muscles of respiration.  CARDIOVASCULAR: S1, S2 RRR. No murmurs, rubs, gallops, clicks.  ABDOMEN: Soft, nontender, nondistended. Bowel sounds present. No organomegaly or mass.  EXTREMITIES: No pedal edema, cyanosis, or clubbing. + 2 pedal & radial pulses b/l.   NEUROLOGIC: Cranial nerves II through XII are intact. No focal Motor or sensory deficits appreciated b/l. Globally weak.  PSYCHIATRIC: The patient is alert and oriented x 3.  SKIN: No obvious rash, lesion, or ulcer.   LABORATORY PANEL:   CBC Recent Labs  Lab 02/16/18 1652  WBC 4.5  HGB 10.9*  HCT 36.7  PLT 335   ------------------------------------------------------------------------------------------------------------------  Chemistries  Recent Labs  Lab 02/16/18 1652  NA 144  K 2.9*  CL 110  CO2 25  GLUCOSE 99  BUN 8    CREATININE 1.58*  CALCIUM 9.1   ------------------------------------------------------------------------------------------------------------------  Cardiac Enzymes Recent Labs  Lab 02/16/18 1652  TROPONINI 0.06*   ------------------------------------------------------------------------------------------------------------------  RADIOLOGY:  Dg Chest 2 View  Result Date: 02/16/2018 CLINICAL DATA:  Chest pain EXAM: CHEST - 2 VIEW COMPARISON:  04/08/2016 CXR, CT cervical spine 02/27/2006 FINDINGS: Soft tissue prominence in the superior mediastinum is felt secondary to tortuous great vessels as seen on images from a prior neck CT dated 02/27/2006. Cardiomegaly is identified. Nonaneurysmal appearing thoracic aorta. Chronic interstitial prominence is noted bilaterally with streaky parenchymal opacities in the retrocardiac left lung base. Mild degenerative change is noted along the dorsal spine. IMPRESSION: 1. Cardiomegaly with atelectasis and/or scarring at the left lung base medially. 2. Probable mild chronic interstitial lung disease accounting for bilateral mild diffuse increase in interstitial lung markings. 3. Superior mediastinal soft tissue prominence is felt secondary to tortuous great vessels based on prior neck CT from 2007. Electronically Signed   By: Ashley Royalty M.D.   On: 02/16/2018 17:45   Dg Shoulder Right  Result Date: 02/16/2018 CLINICAL DATA:  Right shoulder pain  after fall. EXAM: RIGHT SHOULDER - 2+ VIEW COMPARISON:  None. FINDINGS: Osteoarthritis of the [facility] and glenohumeral joints with slightly high riding humeral head that can be seen with chronic rotator cuff tears. No fracture is noted. The adjacent ribs and lung are nonacute. No joint dislocation is seen. IMPRESSION: Osteoarthritis of the AC and glenohumeral joints with slightly high-riding humeral head as above. No acute fracture or joint dislocation. Electronically Signed   By: Ashley Royalty M.D.   On: 02/16/2018 17:42   Ct  Head Wo Contrast  Result Date: 02/16/2018 CLINICAL DATA:  84 year old with head trauma. EXAM: CT HEAD WITHOUT CONTRAST TECHNIQUE: Contiguous axial images were obtained from the base of the skull through the vertex without intravenous contrast. COMPARISON:  08/26/2014 FINDINGS: Brain: Stable diffuse cerebral atrophy. Extensive low density throughout the white matter is similar to the prior examination and compatible with chronic changes. No evidence for acute hemorrhage, mass lesion, midline shift, hydrocephalus or large infarct. Vascular: No hyperdense vessel or unexpected calcification. Skull: Normal. Negative for fracture or focal lesion. Sinuses/Orbits: Mucosal thickening in the visualized ethmoid air cells. Other: None. IMPRESSION: 1. No acute intracranial abnormality. 2. Diffuse atrophy with extensive white matter disease. Findings are most compatible with chronic small vessel ischemic disease. Findings are similar to the prior examination. Electronically Signed   By: Markus Daft M.D.   On: 02/16/2018 17:14   Dg Hip Unilat W Or Wo Pelvis 2-3 Views Right  Result Date: 02/16/2018 CLINICAL DATA:  Pain after fall EXAM: DG HIP (WITH OR WITHOUT PELVIS) 2-3V RIGHT COMPARISON:  None. FINDINGS: There is no evidence of hip fracture or dislocation. The included lumbar spine demonstrates vacuum disc phenomena at L4-5 consistent with degenerative disc disease. Facet arthrosis is noted at L5-S1. Bilateral phleboliths are present within the pelvis. No pelvic diastasis is noted. Mild osteoarthritis of the SI joints and pubic symphysis. Both hip joints are maintained and aligned without flattening of the femoral heads. IMPRESSION: No acute osseous abnormality of the right hip. Electronically Signed   By: Ashley Royalty M.D.   On: 02/16/2018 17:47     IMPRESSION AND PLAN:   84 year old female with past medical history of dementia, hypertension, osteoporosis, hyperlipidemia, history of gout, chronic kidney disease stage  III who presented to the hospital after syncopal episode.  1.  Syncope- etiology of syncope is unclear but suspected to be vasovagal in nature. - We will observe her overnight on telemetry, she has a mildly elevated troponin, will cycle her cardiac markers. - Check an echocardiogram, carotid duplex.  Patient CT head is negative for acute pathology.   -We will also check orthostatic vital signs.  2.  Elevated troponin-likely in the setting of supply demand ischemia and also poor renal clearance from her CKD stage III.   -we will observe on telemetry, cycle her cardiac markers.  Check an echocardiogram.   -continue aspirin, statin.  If markers trend up then we will consult cardiology.  3.  Hypokalemia-replace the patient's potassium orally.  Repeat level in the morning. -Check magnesium level.  4.  History of gout-no acute attack.  Continue allopurinol.  5.  Dementia-continue Aricept.  6.  Hyperlipidemia-continue atorvastatin.  7.  GERD-continue Protonix.   All the records are reviewed and case discussed with ED provider. Management plans discussed with the patient, family and they are in agreement.  CODE STATUS: Full code  TOTAL TIME TAKING CARE OF THIS PATIENT: 40 minutes.    Henreitta Leber M.D on 02/16/2018 at 7:03  PM  Between 7am to 6pm - Pager - [phone]  After 6pm go to [email or web address] - password [email or web address] [facility]  [facility]  Office  [phone]  CC: Primary care physician; Steele Sizer, MD

## 2018-02-16 NOTE — ED Triage Notes (Signed)
Pt comes via [facility] from home with c/o AMS. Pt reports she fell. Pt states pain in right arm, back and belly. Pt also c/o central chest pain.  EMS reports VSS.  Pt is alert. Pt reports sharp pain in chest while walking to mailbox. Pt states she fell on her right side. Pt has tenderness noted to right side.

## 2018-02-16 NOTE — Progress Notes (Signed)
Anticoagulation monitoring(Lovenox):  84 yo female ordered Lovenox 40 mg Q24h  Filed Weights   02/16/18 1648 02/16/18 2003  Weight: 140 lb (63.5 kg) 135 lb 9.6 oz (61.5 kg)   BMI    Lab Results  Component Value Date   CREATININE 1.58 (H) 02/16/2018   CREATININE 1.42 (H) 12/06/2017   CREATININE 1.76 (H) 06/08/2017   Estimated Creatinine Clearance: 19.6 mL/min (A) (by C-G formula based on SCr of 1.58 mg/dL (H)). Hemoglobin & Hematocrit     Component Value Date/Time   HGB 10.9 (L) 02/16/2018 1652   HCT 36.7 02/16/2018 1652     Per Protocol for Patient with estCrcl < 30 ml/min and BMI < 40, will transition to Lovenox 30 mg Q24h.

## 2018-02-16 NOTE — ED Notes (Signed)
Attempted to call pt's son [name] to inform him of pt's room assignment. Mailbox was full and unable to leave message. Will try home number.

## 2018-02-16 NOTE — ED Notes (Signed)
EMS pt from home , was called to a fall to her knees from a family member that was not on scene.  Once EMS arrived , Police was on scene for a disturbance and another family member there with no knowledge of medical history besides dementia.  EMS reports during transport pt stated her Chest was hurting, and then complaining of abd pain. No noted injury to knees

## 2018-02-16 NOTE — ED Notes (Signed)
Date and time results received: 02/16/18 1740 (use smartphrase ".now" to insert current time)  Test: troponin Critical Value: 0.06  Name of Provider Notified: Alfred Levins  Orders Received? Or Actions Taken?: Orders Received - See Orders for details

## 2018-02-16 NOTE — ED Notes (Signed)
Called [name] pt's son on mobile # when patient gets room assignment.

## 2018-02-16 NOTE — ED Notes (Signed)
Pt transported to CT and then to XRAY to follow

## 2018-02-16 NOTE — ED Provider Notes (Signed)
[facility] Emergency Department Provider Note  ____________________________________________  Time seen: Approximately 4:51 PM  I have reviewed the triage vital signs and the nursing notes.   HISTORY  Chief Complaint Altered Mental Status  Level 5 caveat:  Portions of the history and physical were unable to be obtained due to dementia   HPI Julia Herrera is a 84 y.o. female with h/o dementia, HTN, CKD, osteoporosis who presents for evaluation of fall.  Patient reports that she saw the mailman drop her mail and she started to walk to mailbox when she developed sharp chest.  She felt the "blood rushing through her head", felt dizzy and fell onto her R side. She was ambulatory at the scene. She is complaining of R shoulder pain, R hip pain, and chest pain. She denies head trauma or LOC. No SOB.    Past Medical History:  Diagnosis Date  . Chronic kidney disease   . Dementia ([place])   . Gout   . Hyperlipidemia   . Hypertension   . Osteoporosis     Patient Active Problem List   Diagnosis Date Noted  . Vitamin B12 deficiency 12/07/2017  . Stage 4 chronic kidney disease ([place]) 06/08/2017  . Cognitive dysfunction 06/08/2017  . Osteoporosis, post-menopausal 06/08/2017  . Diuretic-induced hypokalemia 02/23/2016  . Dyshidrotic dermatitis 09/24/2015  . Hyperlipidemia 08/04/2015  . AD (atopic dermatitis) 12/23/2014  . BP (high blood pressure) 12/23/2014  . Hypertension, renal 12/23/2014  . Reflux esophagitis 12/23/2014  . Gout 12/23/2014  . Chronic kidney disease (CKD), stage III (moderate) ([place]) 12/23/2014  . Adiposity 12/23/2014  . 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 CN (constipation) 04/17/2009  . Arthritis due to gout 06/28/2007    Past Surgical History:  Procedure Laterality Date  . CHOLECYSTECTOMY      Prior to Admission medications   Medication Sig Start Date End Date Taking? Authorizing  Provider  allopurinol (ZYLOPRIM) 100 MG tablet Take 100 mg by mouth 2 (two) times daily.     [provider]  atorvastatin (LIPITOR) 20 MG tablet Take 1 tablet (20 mg total) by mouth at bedtime. 11/15/17   Steele Sizer, MD  Cyanocobalamin (B-12) 500 MCG SUBL Place 1 tablet under the tongue daily. 12/08/17   Steele Sizer, MD  donepezil (ARICEPT) 10 MG tablet Take 1 tablet (10 mg total) by mouth at bedtime. 01/13/18   Steele Sizer, MD  omeprazole (PRILOSEC) 20 MG capsule Take 20 mg by mouth as needed.     [provider]  valsartan (DIOVAN) 160 MG tablet Take 0.5 tablets (80 mg total) by mouth daily. To equal 80 mg daily 01/16/18   Steele Sizer, MD    Allergies Allopurinol; Fosamax [alendronate]; Niaspan [niacin er]; and Zocor [simvastatin]  Family History  Problem Relation Age of Onset  . Alcohol abuse Mother   . Heart attack Father   . Heart disease Brother     Social History Social History   Tobacco Use  . Smoking status: Never Smoker  . Smokeless tobacco: Current User    Types: Snuff  . Tobacco comment: smoking cessation materials not required  Substance Use Topics  . Alcohol use: No    Alcohol/week: 0.0 standard drinks  . Drug use: No    Review of Systems Constitutional: Negative for fever. + dizziness Eyes: Negative for visual changes. ENT: Negative for facial injury or neck injury Cardiovascular: + chest pain Respiratory: Negative for shortness of breath.  Negative for chest wall injury. Gastrointestinal: Negative for abdominal pain or injury. Genitourinary: Negative for dysuria. Musculoskeletal: Negative for back injury. + R shoulder and R hip pain Skin: Negative for laceration/abrasions. Neurological: Negative for head injury.   ____________________________________________   PHYSICAL EXAM:  VITAL SIGNS: Vitals:   02/16/18 1649 02/16/18 1734  BP: (!) 165/96 (!) 158/83  Pulse: 82 74  Resp: 18 (!) 24  Temp: 98.1 F (36.7 C)     SpO2: 100% 100%   Full spinal precautions maintained throughout the trauma exam. Constitutional: Alert and oriented x2. No acute distress. Does not appear intoxicated. HEENT Head: Normocephalic and atraumatic. Face: No facial bony tenderness. Stable midface Ears: No hemotympanum bilaterally. No Battle sign Eyes: No eye injury. PERRL. No raccoon eyes Nose: Nontender. No epistaxis. No rhinorrhea Mouth/Throat: Mucous membranes are moist. No oropharyngeal blood. No dental injury. Airway patent without stridor. Normal voice. Neck: no C-collar in place. No midline c-spine tenderness.  Cardiovascular: Normal rate, regular rhythm. Normal and symmetric distal pulses are present in all extremities. Pulmonary/Chest: Chest wall is stable and nontender to palpation/compression. Normal respiratory effort. Breath sounds are normal. No crepitus.  Abdominal: Soft, nontender, non distended. Musculoskeletal: Patient has tenderness to palpation over the proximal R humerus with no bruising or deformity. She is unable to flex her R hip due to pain. Nontender with normal full range of motion in all other extremities. No deformities. No thoracic or lumbar midline spinal tenderness. Pelvis is stable. Skin: Skin is warm, dry and intact. No abrasions or contutions. Psychiatric: Speech and behavior are appropriate. Neurological: Normal speech and language. Moves all extremities to command. No gross focal neurologic deficits are appreciated.  Glascow Coma Score: 4 - Opens eyes on own 6 - Follows simple motor commands 5 - Alert and oriented GCS: 15   ____________________________________________   LABS (all labs ordered are listed, but only abnormal results are displayed)  Labs Reviewed  CBC WITH DIFFERENTIAL/PLATELET - Abnormal; Notable for the following components:      Result Value   Hemoglobin 10.9 (*)    MCH 25.6 (*)    MCHC 29.7 (*)    RDW 16.9 (*)    All other components within normal limits  BASIC  METABOLIC PANEL - Abnormal; Notable for the following components:   Potassium 2.9 (*)    Creatinine, Ser 1.58 (*)    GFR calc non Af Amer 29 (*)    GFR calc Af Amer 33 (*)    All other components within normal limits  TROPONIN I - Abnormal; Notable for the following components:   Troponin I 0.06 (*)    All other components within normal limits  TYPE AND SCREEN   ____________________________________________  EKG   ED ECG REPORT I, Rudene Re, the attending physician, personally viewed and interpreted this ECG.  Normal sinus rhythm, rate of 79, first-degree block, left bundle branch block, prolonged QTC, normal axis, no STE elevation or depression. Unchanged from prior ____________________________________________  RADIOLOGY  I have personally reviewed the images performed during this visit and I agree with the Radiologist's read.   Interpretation by Radiologist:  Dg Chest 2 View  Result Date: 02/16/2018 CLINICAL DATA:  Chest pain EXAM: CHEST - 2 VIEW COMPARISON:  04/08/2016 CXR, CT cervical spine 02/27/2006 FINDINGS: Soft tissue prominence in the superior mediastinum is felt secondary to tortuous great vessels as seen on images from a prior neck CT dated 02/27/2006. Cardiomegaly is identified. Nonaneurysmal appearing thoracic aorta. Chronic interstitial prominence is noted bilaterally  with streaky parenchymal opacities in the retrocardiac left lung base. Mild degenerative change is noted along the dorsal spine. IMPRESSION: 1. Cardiomegaly with atelectasis and/or scarring at the left lung base medially. 2. Probable mild chronic interstitial lung disease accounting for bilateral mild diffuse increase in interstitial lung markings. 3. Superior mediastinal soft tissue prominence is felt secondary to tortuous great vessels based on prior neck CT from 2007. Electronically Signed   By: Ashley Royalty M.D.   On: 02/16/2018 17:45   Dg Shoulder Right  Result Date: 02/16/2018 CLINICAL DATA:   Right shoulder pain after fall. EXAM: RIGHT SHOULDER - 2+ VIEW COMPARISON:  None. FINDINGS: Osteoarthritis of the [facility] and glenohumeral joints with slightly high riding humeral head that can be seen with chronic rotator cuff tears. No fracture is noted. The adjacent ribs and lung are nonacute. No joint dislocation is seen. IMPRESSION: Osteoarthritis of the AC and glenohumeral joints with slightly high-riding humeral head as above. No acute fracture or joint dislocation. Electronically Signed   By: Ashley Royalty M.D.   On: 02/16/2018 17:42   Ct Head Wo Contrast  Result Date: 02/16/2018 CLINICAL DATA:  84 year old with head trauma. EXAM: CT HEAD WITHOUT CONTRAST TECHNIQUE: Contiguous axial images were obtained from the base of the skull through the vertex without intravenous contrast. COMPARISON:  08/26/2014 FINDINGS: Brain: Stable diffuse cerebral atrophy. Extensive low density throughout the white matter is similar to the prior examination and compatible with chronic changes. No evidence for acute hemorrhage, mass lesion, midline shift, hydrocephalus or large infarct. Vascular: No hyperdense vessel or unexpected calcification. Skull: Normal. Negative for fracture or focal lesion. Sinuses/Orbits: Mucosal thickening in the visualized ethmoid air cells. Other: None. IMPRESSION: 1. No acute intracranial abnormality. 2. Diffuse atrophy with extensive white matter disease. Findings are most compatible with chronic small vessel ischemic disease. Findings are similar to the prior examination. Electronically Signed   By: Markus Daft M.D.   On: 02/16/2018 17:14   Dg Hip Unilat W Or Wo Pelvis 2-3 Views Right  Result Date: 02/16/2018 CLINICAL DATA:  Pain after fall EXAM: DG HIP (WITH OR WITHOUT PELVIS) 2-3V RIGHT COMPARISON:  None. FINDINGS: There is no evidence of hip fracture or dislocation. The included lumbar spine demonstrates vacuum disc phenomena at L4-5 consistent with degenerative disc disease. Facet arthrosis is  noted at L5-S1. Bilateral phleboliths are present within the pelvis. No pelvic diastasis is noted. Mild osteoarthritis of the SI joints and pubic symphysis. Both hip joints are maintained and aligned without flattening of the femoral heads. IMPRESSION: No acute osseous abnormality of the right hip. Electronically Signed   By: Ashley Royalty M.D.   On: 02/16/2018 17:47      ____________________________________________   PROCEDURES  Procedure(s) performed: None Procedures Critical Care performed: yes  CRITICAL CARE Performed by: Rudene Re  ?  Total critical care time: 35 min  Critical care time was exclusive of separately billable procedures and treating other patients.  Critical care was necessary to treat or prevent imminent or life-threatening deterioration.  Critical care was time spent personally by me on the following activities: development of treatment plan with patient and/or surrogate as well as nursing, discussions with consultants, evaluation of patient's response to treatment, examination of patient, obtaining history from patient or surrogate, ordering and performing treatments and interventions, ordering and review of laboratory studies, ordering and review of radiographic studies, pulse oximetry and re-evaluation of patient's condition.  ____________________________________________   INITIAL IMPRESSION / ASSESSMENT AND PLAN / ED COURSE  84 y.o. female with h/o dementia, HTN, CKD, osteoporosis who presents for evaluation of fall preceded by chest pain and dizziness. DDX for CP and dizziness: ACS, PE, dissection, vasovagal, orthostasis  Patient has significant tenderness to palpation in the proximal right humerus and right hip, no head trauma, no CT and L-spine tenderness.  EKG showing no changes from baseline.  We will cycle cardiac enzymes to rule out ACS. Will get CXR. No hypoxia, tachycardia, shortness of breath, tachypnea, no severely elevated BP and neuro intact  exam otherwise. Will get head CT and XR of the R shoulder and R hip. Will check basic labs.     _________________________ 6:10 PM on 02/16/2018 -----------------------------------------  First troponin is positive at 0.06.  Patient also found to be hypokalemic and with new anemia when compared to labs from 3 months ago.  She is no longer having any chest pain at this time.  She was given a full dose of aspirin.  X-rays and CT negative for acute injuries.  Will discuss with the hospitalist for admission.   As part of my medical decision making, I reviewed the following data within the [facility] notes reviewed and incorporated, Labs reviewed , EKG interpreted , Old EKG reviewed, Old chart reviewed, Radiograph reviewed , Discussed with admitting physician , Notes from prior ED visits and [place] Controlled Substance Database    Pertinent labs & imaging results that were available during my care of the patient were reviewed by me and considered in my medical decision making (see chart for details).    ____________________________________________   FINAL CLINICAL IMPRESSION(S) / ED DIAGNOSES  Final diagnoses:  NSTEMI (non-ST elevated myocardial infarction) ([place])  Near syncope  Acute pain of right shoulder  Right hip pain  Hypokalemia  Anemia, unspecified type      NEW MEDICATIONS STARTED DURING THIS VISIT:  ED Discharge Orders    None       Note:  This document was prepared using Dragon voice recognition software and may include unintentional dictation errors.    Rudene Re, MD 02/16/18 [phone]

## 2018-02-17 ENCOUNTER — Observation Stay (HOSPITAL_BASED_OUTPATIENT_CLINIC_OR_DEPARTMENT_OTHER)
Admit: 2018-02-17 | Discharge: 2018-02-17 | Disposition: A | Discharge status: Home / Self Care | Payer: Medicare Other | Attending: Specialist | Admitting: Specialist

## 2018-02-17 ENCOUNTER — Observation Stay: Payer: Medicare Other

## 2018-02-17 DIAGNOSIS — N183 Chronic kidney disease, stage 3 (moderate): Secondary | ICD-10-CM | POA: Diagnosis not present

## 2018-02-17 DIAGNOSIS — I340 Nonrheumatic mitral (valve) insufficiency: Secondary | ICD-10-CM

## 2018-02-17 DIAGNOSIS — R55 Syncope and collapse: Secondary | ICD-10-CM | POA: Diagnosis not present

## 2018-02-17 DIAGNOSIS — I361 Nonrheumatic tricuspid (valve) insufficiency: Secondary | ICD-10-CM | POA: Diagnosis not present

## 2018-02-17 DIAGNOSIS — I6523 Occlusion and stenosis of bilateral carotid arteries: Secondary | ICD-10-CM | POA: Diagnosis not present

## 2018-02-17 DIAGNOSIS — R7989 Other specified abnormal findings of blood chemistry: Secondary | ICD-10-CM | POA: Diagnosis not present

## 2018-02-17 LAB — BASIC METABOLIC PANEL
Anion gap: 5 (ref 5–15)
BUN: 8 mg/dL (ref 8–23)
CO2: 25 mmol/L (ref 22–32)
Calcium: 8.1 mg/dL — ABNORMAL LOW (ref 8.9–10.3)
Chloride: 113 mmol/L — ABNORMAL HIGH (ref 98–111)
Creatinine, Ser: 1.47 mg/dL — ABNORMAL HIGH (ref 0.44–1.00)
GFR calc Af Amer: 36 mL/min — ABNORMAL LOW (ref >60)
GFR calc non Af Amer: 31 mL/min — ABNORMAL LOW (ref >60)
Glucose, Bld: 88 mg/dL (ref 70–99)
Potassium: 2.8 mmol/L — ABNORMAL LOW (ref 3.5–5.1)
Sodium: 143 mmol/L (ref 135–145)

## 2018-02-17 LAB — CBC
HCT: 31.6 % — ABNORMAL LOW (ref 36.0–46.0)
Hemoglobin: 9.8 g/dL — ABNORMAL LOW (ref 12.0–15.0)
MCH: 26.1 pg (ref 26.0–34.0)
MCHC: 31 g/dL (ref 30.0–36.0)
MCV: 84.3 fL (ref 80.0–100.0)
NRBC: 0 % (ref 0.0–0.2)
Platelets: 303 10*3/uL (ref 150–400)
RBC: 3.75 MIL/uL — ABNORMAL LOW (ref 3.87–5.11)
RDW: 17 % — ABNORMAL HIGH (ref 11.5–15.5)
WBC: 3.4 10*3/uL — ABNORMAL LOW (ref 4.0–10.5)

## 2018-02-17 LAB — ECHOCARDIOGRAM COMPLETE
Height: 63 in
Weight: 2169.6 oz

## 2018-02-17 LAB — TROPONIN I
Troponin I: <0.03 ng/mL (ref <0.03)
Troponin I: <0.03 ng/mL (ref <0.03)

## 2018-02-17 MED ORDER — POTASSIUM CHLORIDE CRYS ER 20 MEQ PO TBCR: 20.0000 meq | EXTENDED_RELEASE_TABLET | Freq: Two times a day (BID) | ORAL | 0 refills | Status: DC

## 2018-02-17 MED ORDER — POTASSIUM CHLORIDE 20 MEQ/15ML (10%) PO SOLN
60.0000 meq | Freq: Once | ORAL | Status: AC
  Administered 2018-02-17: 60 meq via ORAL
  Filled 2018-02-17: qty 45

## 2018-02-17 NOTE — Care Management Note (Signed)
Case Management Note  Patient Details  Name: Julia Herrera MRN: [id number] Date of Birth: [birth year]  Subjective/Objective:    Patient is from home alone.  Family and friends at bedside.  Placed in observation for syncope.  She does not use a walker or a cane at home.  She is independent and uses the transportation system to get around.  She is current with her PCP.  Denies difficulties obtaining medications or with medical care.  She cooks her own meals.  Her sister at bedside says her children "take" everything from her.  When asked what that meant; she said they take advantage of her.  Offered choice for home health services.  Made referral to [facility] for RN, PT, SW, Aide and OT.  Family is transporting patient home.  No further needs identified at this time.                 Action/Plan:   Expected Discharge Date:  02/17/18               Expected Discharge Plan:  [place]  In-House Referral:     Discharge planning Services  CM Consult  Post Acute Care Choice:  Home Health Choice offered to:  Patient  DME Arranged:    DME Agency:     HH Arranged:  RN, PT, OT, Social Work, Nurse's Aide [place] Agency:  [facility]  Status of Service:  Completed, signed off  If discussed at [employer], dates discussed:    Additional Comments:  Elza Rafter, RN 02/17/2018, 1:22 PM

## 2018-02-17 NOTE — Progress Notes (Signed)
*  PRELIMINARY RESULTS* Echocardiogram 2D Echocardiogram has been performed.  Sherrie Sport 02/17/2018, 10:31 AM

## 2018-02-17 NOTE — Consult Note (Signed)
PHARMACY CONSULT NOTE - FOLLOW UP  Pharmacy Consult for Electrolyte Monitoring and Replacement   Recent Labs: Potassium (mmol/L)  Date Value  02/17/2018 2.8 (L)   Magnesium (mg/dL)  Date Value  02/16/2018 2.0   Calcium (mg/dL)  Date Value  02/17/2018 8.1 (L)   Albumin (g/dL)  Date Value  08/26/2016 3.6  08/04/2015 4.2  ]   Assessment: 84 y.o. female with a known history of chronic kidney disease stage III, dementia, gout, hypertension, hyperlipidemia, osteoporosis who presents to the hospital after a syncopal episode and noted to have a mildly elevated troponin with hypokalemia.84 y.o. female with a known history of chronic kidney disease stage III, dementia, gout, hypertension, hyperlipidemia, osteoporosis who presents to the hospital after a syncopal episode and noted to have a mildly elevated troponin with hypokalemia.  Goal of Therapy:  K ~ 4.0  Plan:  MD ordered a total of 80 mEq this AM, KCl 60 mEq once and active order for KCl 20 mEq BID.  Will recheck potassium at 1400 to reassess need for further replacement.  Forrest Moron ,PharmD Clinical Pharmacist 02/17/2018 8:30 AM

## 2018-02-17 NOTE — Discharge Summary (Signed)
[facility] [place] NAME: Julia Herrera    MR#:  [id number]  DATE OF BIRTH:  Mar 26, 1925  DATE OF ADMISSION:  02/16/2018 ADMITTING PHYSICIAN: Henreitta Leber, MD  DATE OF DISCHARGE: 02/17/2018  PRIMARY CARE PHYSICIAN: Steele Sizer, MD    ADMISSION DIAGNOSIS:  Hypokalemia [E87.6] Right hip pain [M25.551] NSTEMI (non-ST elevated myocardial infarction) ([place]) [I21.4] Near syncope [R55] Acute pain of right shoulder [M25.511] Anemia, unspecified type [D64.9]  DISCHARGE DIAGNOSIS:  Active Problems:   Syncope elevated troponin  SECONDARY DIAGNOSIS:   Past Medical History:  Diagnosis Date  . Chronic kidney disease   . Dementia ([place])   . Gout   . Hyperlipidemia   . Hypertension   . Osteoporosis     HOSPITAL COURSE:  84 year old female with history of dementia, chronic kidney disease stage III and hypertension who presented to the emergency room after syncopal episode. 1.  Syncope: I suspect this is due to hypokalemia and mild dehydration. Carotid Doppler showed no hemodynamically significant stenosis. ECHO shows no WMA or major valvular abnormalities.  2.  Hypokalemia: This was aggressively repleted.  3.  Acute kidney injury in the setting of mild dehydration which improved with IV fluids  4.  Elevated troponin: This is due to hypokalemia and demand ischemia.  Patient had no evidence of ACS.  The next set of troponins were less than 0.03.  5.  Dementia: Patient will continue on Aricept  6.  Essential hypertension: Continue Diovan    DISCHARGE CONDITIONS AND DIET:   Stable for discharge on regular diet  CONSULTS OBTAINED:    DRUG ALLERGIES:   Allergies  Allergen Reactions  . Allopurinol Other (See Comments)    dizziness  . Fosamax [Alendronate] Nausea Only  . Niaspan [Niacin Er] Other (See Comments)    unknown  . Zocor [Simvastatin] Other (See Comments)    Muscle pain    DISCHARGE MEDICATIONS:   Allergies as of  02/17/2018      Reactions   Allopurinol Other (See Comments)   dizziness   Fosamax [alendronate] Nausea Only   Niaspan [niacin Er] Other (See Comments)   unknown   Zocor [simvastatin] Other (See Comments)   Muscle pain      Medication List    TAKE these medications   allopurinol 100 MG tablet Commonly known as:  ZYLOPRIM Take 100 mg by mouth 2 (two) times daily.   aspirin EC 81 MG tablet Take 81 mg by mouth daily.   atorvastatin 20 MG tablet Commonly known as:  LIPITOR Take 1 tablet (20 mg total) by mouth at bedtime.   B-12 500 MCG Subl Place 1 tablet under the tongue daily.   donepezil 10 MG tablet Commonly known as:  ARICEPT Take 1 tablet (10 mg total) by mouth at bedtime.   omeprazole 20 MG capsule Commonly known as:  PRILOSEC Take 20 mg by mouth as needed.   potassium chloride SA 20 MEQ tablet Commonly known as:  K-DUR,KLOR-CON Take 1 tablet (20 mEq total) by mouth 2 (two) times daily for 1 day.   valsartan 160 MG tablet Commonly known as:  DIOVAN Take 0.5 tablets (80 mg total) by mouth daily. To equal 80 mg daily         Today   CHIEF COMPLAINT:  I want to go home  No acute issues overnight   VITAL SIGNS:  Blood pressure 133/61, pulse (!) 48, temperature 97.6 F (36.4 C), temperature source Oral, resp. rate 18,  height 5\' 3"  (1.6 m), weight 61.5 kg, SpO2 100 %.   REVIEW OF SYSTEMS:  Review of Systems  Constitutional: Negative.  Negative for chills, fever and malaise/fatigue.  HENT: Negative.  Negative for ear discharge, ear pain, hearing loss, nosebleeds and sore throat.   Eyes: Negative.  Negative for blurred vision and pain.  Respiratory: Negative.  Negative for cough, hemoptysis, shortness of breath and wheezing.   Cardiovascular: Negative.  Negative for chest pain, palpitations and leg swelling.  Gastrointestinal: Negative.  Negative for abdominal pain, blood in stool, diarrhea, nausea and vomiting.  Genitourinary: Negative.  Negative for  dysuria.  Musculoskeletal: Negative.  Negative for back pain.  Skin: Negative.   Neurological: Negative for dizziness, tremors, speech change, focal weakness, seizures and headaches.  Endo/Heme/Allergies: Negative.  Does not bruise/bleed easily.  Psychiatric/Behavioral: Negative.  Negative for depression, hallucinations and suicidal ideas.     PHYSICAL EXAMINATION:  GENERAL:  84 y.o.-year-old patient lying in the bed with no acute distress.  NECK:  Supple, no jugular venous distention. No thyroid enlargement, no tenderness.  LUNGS: Normal breath sounds bilaterally, no wheezing, rales,rhonchi  No use of accessory muscles of respiration.  CARDIOVASCULAR: S1, S2 normal. No murmurs, rubs, or gallops.  ABDOMEN: Soft, non-tender, non-distended. Bowel sounds present. No organomegaly or mass.  EXTREMITIES: No pedal edema, cyanosis, or clubbing.  PSYCHIATRIC: The patient is alert and oriented x 3.  SKIN: No obvious rash, lesion, or ulcer.   DATA REVIEW:   CBC Recent Labs  Lab 02/17/18 0454  WBC 3.4*  HGB 9.8*  HCT 31.6*  PLT 303    Chemistries  Recent Labs  Lab 02/16/18 1910 02/17/18 0454  NA  --  143  K  --  2.8*  CL  --  113*  CO2  --  25  GLUCOSE  --  88  BUN  --  8  CREATININE  --  1.47*  CALCIUM  --  8.1*  MG 2.0  --     Cardiac Enzymes Recent Labs  Lab 02/16/18 2107 02/17/18 0023 02/17/18 0454  TROPONINI <0.03 <0.03 <0.03    Microbiology Results  @MICRORSLT48 @  RADIOLOGY:  Dg Chest 2 View  Result Date: 02/16/2018 CLINICAL DATA:  Chest pain EXAM: CHEST - 2 VIEW COMPARISON:  04/08/2016 CXR, CT cervical spine 02/27/2006 FINDINGS: Soft tissue prominence in the superior mediastinum is felt secondary to tortuous great vessels as seen on images from a prior neck CT dated 02/27/2006. Cardiomegaly is identified. Nonaneurysmal appearing thoracic aorta. Chronic interstitial prominence is noted bilaterally with streaky parenchymal opacities in the retrocardiac left lung  base. Mild degenerative change is noted along the dorsal spine. IMPRESSION: 1. Cardiomegaly with atelectasis and/or scarring at the left lung base medially. 2. Probable mild chronic interstitial lung disease accounting for bilateral mild diffuse increase in interstitial lung markings. 3. Superior mediastinal soft tissue prominence is felt secondary to tortuous great vessels based on prior neck CT from 2007. Electronically Signed   By: Ashley Royalty M.D.   On: 02/16/2018 17:45   Dg Shoulder Right  Result Date: 02/16/2018 CLINICAL DATA:  Right shoulder pain after fall. EXAM: RIGHT SHOULDER - 2+ VIEW COMPARISON:  None. FINDINGS: Osteoarthritis of the [facility] and glenohumeral joints with slightly high riding humeral head that can be seen with chronic rotator cuff tears. No fracture is noted. The adjacent ribs and lung are nonacute. No joint dislocation is seen. IMPRESSION: Osteoarthritis of the AC and glenohumeral joints with slightly high-riding humeral head as above. No  acute fracture or joint dislocation. Electronically Signed   By: Ashley Royalty M.D.   On: 02/16/2018 17:42   Ct Head Wo Contrast  Result Date: 02/16/2018 CLINICAL DATA:  84 year old with head trauma. EXAM: CT HEAD WITHOUT CONTRAST TECHNIQUE: Contiguous axial images were obtained from the base of the skull through the vertex without intravenous contrast. COMPARISON:  08/26/2014 FINDINGS: Brain: Stable diffuse cerebral atrophy. Extensive low density throughout the white matter is similar to the prior examination and compatible with chronic changes. No evidence for acute hemorrhage, mass lesion, midline shift, hydrocephalus or large infarct. Vascular: No hyperdense vessel or unexpected calcification. Skull: Normal. Negative for fracture or focal lesion. Sinuses/Orbits: Mucosal thickening in the visualized ethmoid air cells. Other: None. IMPRESSION: 1. No acute intracranial abnormality. 2. Diffuse atrophy with extensive white matter disease. Findings are  most compatible with chronic small vessel ischemic disease. Findings are similar to the prior examination. Electronically Signed   By: Markus Daft M.D.   On: 02/16/2018 17:14   US Carotid Bilateral  Result Date: 02/17/2018 CLINICAL DATA:  SYNCOPE EXAM: BILATERAL CAROTID DUPLEX ULTRASOUND TECHNIQUE: Pearline Cables scale imaging, color Doppler and duplex ultrasound were performed of bilateral carotid and vertebral arteries in the neck. COMPARISON:  None. FINDINGS: Criteria: Quantification of carotid stenosis is based on velocity parameters that correlate the residual internal carotid diameter with NASCET-based stenosis levels, using the diameter of the distal internal carotid lumen as the denominator for stenosis measurement. The following velocity measurements were obtained: RIGHT ICA: 49/15 cm/sec CCA: 67/89 cm/sec SYSTOLIC ICA/CCA RATIO:  0.9 ECA: 67 cm/sec LEFT ICA: 59/20 cm/sec CCA: 38/10 cm/sec SYSTOLIC ICA/CCA RATIO:  1.1 ECA: 74 cm/sec RIGHT CAROTID ARTERY: Minor echogenic shadowing plaque formation. No hemodynamically significant right ICA stenosis, velocity elevation, or turbulent flow. Degree of narrowing less than 50%. RIGHT VERTEBRAL ARTERY:  Antegrade LEFT CAROTID ARTERY: Similar scattered minor echogenic plaque formation. No hemodynamically significant left ICA stenosis, velocity elevation, or turbulent flow. LEFT VERTEBRAL ARTERY:  Antegrade IMPRESSION: Minor carotid atherosclerosis. No hemodynamically significant ICA stenosis. Degree of narrowing less than 50% bilaterally by ultrasound criteria. Patent antegrade vertebral flow bilaterally Electronically Signed   By: Jerilynn Mages.  Shick M.D.   On: 02/17/2018 09:29   Dg Hip Unilat W Or Wo Pelvis 2-3 Views Right  Result Date: 02/16/2018 CLINICAL DATA:  Pain after fall EXAM: DG HIP (WITH OR WITHOUT PELVIS) 2-3V RIGHT COMPARISON:  None. FINDINGS: There is no evidence of hip fracture or dislocation. The included lumbar spine demonstrates vacuum disc phenomena at L4-5  consistent with degenerative disc disease. Facet arthrosis is noted at L5-S1. Bilateral phleboliths are present within the pelvis. No pelvic diastasis is noted. Mild osteoarthritis of the SI joints and pubic symphysis. Both hip joints are maintained and aligned without flattening of the femoral heads. IMPRESSION: No acute osseous abnormality of the right hip. Electronically Signed   By: Ashley Royalty M.D.   On: 02/16/2018 17:47      Allergies as of 02/17/2018      Reactions   Allopurinol Other (See Comments)   dizziness   Fosamax [alendronate] Nausea Only   Niaspan [niacin Er] Other (See Comments)   unknown   Zocor [simvastatin] Other (See Comments)   Muscle pain      Medication List    TAKE these medications   allopurinol 100 MG tablet Commonly known as:  ZYLOPRIM Take 100 mg by mouth 2 (two) times daily.   aspirin EC 81 MG tablet Take 81 mg by mouth daily.  atorvastatin 20 MG tablet Commonly known as:  LIPITOR Take 1 tablet (20 mg total) by mouth at bedtime.   B-12 500 MCG Subl Place 1 tablet under the tongue daily.   donepezil 10 MG tablet Commonly known as:  ARICEPT Take 1 tablet (10 mg total) by mouth at bedtime.   omeprazole 20 MG capsule Commonly known as:  PRILOSEC Take 20 mg by mouth as needed.   potassium chloride SA 20 MEQ tablet Commonly known as:  K-DUR,KLOR-CON Take 1 tablet (20 mEq total) by mouth 2 (two) times daily for 1 day.   valsartan 160 MG tablet Commonly known as:  DIOVAN Take 0.5 tablets (80 mg total) by mouth daily. To equal 80 mg daily         Management plans discussed with the patient and she is in agreement. Stable for discharge   Patient should follow up with pcp  CODE STATUS:     Code Status Orders  (From admission, onward)         Start     Ordered   02/16/18 2035  Full code  Continuous     02/16/18 2034        Code Status History    This patient has a current code status but no historical code status.       TOTAL TIME TAKING CARE OF THIS PATIENT: 38 minutes.    Note: This dictation was prepared with Dragon dictation along with smaller phrase technology. Any transcriptional errors that result from this process are unintentional.  Undine Nealis M.D on 02/17/2018 at 10:18 AM  Between 7am to 6pm - Pager - [phone] After 6pm go to [email or web address] - password [email or web address] [facility]  Office  [phone]  CC: Primary care physician; Steele Sizer, MD

## 2018-02-17 NOTE — Progress Notes (Signed)
Pt to be discharged today. Iv and tele removed. disch instructions and prescrip given. disch via w.c. Accompanied by son.

## 2018-02-17 NOTE — Progress Notes (Signed)
No neuro changes noted this shift.  VSS.  SB/JR noted on telemetry.

## 2018-02-21 DIAGNOSIS — K210 Gastro-esophageal reflux disease with esophagitis: Secondary | ICD-10-CM | POA: Diagnosis not present

## 2018-02-21 DIAGNOSIS — I129 Hypertensive chronic kidney disease with stage 1 through stage 4 chronic kidney disease, or unspecified chronic kidney disease: Secondary | ICD-10-CM | POA: Diagnosis not present

## 2018-02-21 DIAGNOSIS — M810 Age-related osteoporosis without current pathological fracture: Secondary | ICD-10-CM | POA: Diagnosis not present

## 2018-02-21 DIAGNOSIS — E538 Deficiency of other specified B group vitamins: Secondary | ICD-10-CM | POA: Diagnosis not present

## 2018-02-21 DIAGNOSIS — N184 Chronic kidney disease, stage 4 (severe): Secondary | ICD-10-CM | POA: Diagnosis not present

## 2018-02-21 DIAGNOSIS — E785 Hyperlipidemia, unspecified: Secondary | ICD-10-CM | POA: Diagnosis not present

## 2018-02-21 DIAGNOSIS — Z9181 History of falling: Secondary | ICD-10-CM | POA: Diagnosis not present

## 2018-02-21 DIAGNOSIS — M109 Gout, unspecified: Secondary | ICD-10-CM | POA: Diagnosis not present

## 2018-02-23 ENCOUNTER — Telehealth: Payer: Self-pay

## 2018-02-23 NOTE — Telephone Encounter (Signed)
Copied from [place] [id number]. Topic: Appointment Scheduling - Scheduling Inquiry for Clinic >> Feb 17, 2018 10:28 AM Conception Chancy, NT wrote: Reason for CRM: patient is being discharged from the hospital today and is needing a 1 week hfu. Dr. Ancil Boozer is booked out. Please contact to schedule. >> Feb 17, 2018 10:45 AM Karene Fry P wrote: Please help me find a spot to place this HFU appt

## 2018-02-27 NOTE — Telephone Encounter (Signed)
Tried calling patient but no answer and no voicemail set up to inform her that we have scheduled her to see Dr. Ancil Boozer on 03/07/18 @ 10am for hospital.

## 2018-03-01 DIAGNOSIS — M810 Age-related osteoporosis without current pathological fracture: Secondary | ICD-10-CM | POA: Diagnosis not present

## 2018-03-01 DIAGNOSIS — E785 Hyperlipidemia, unspecified: Secondary | ICD-10-CM | POA: Diagnosis not present

## 2018-03-01 DIAGNOSIS — Z9181 History of falling: Secondary | ICD-10-CM | POA: Diagnosis not present

## 2018-03-01 DIAGNOSIS — M109 Gout, unspecified: Secondary | ICD-10-CM | POA: Diagnosis not present

## 2018-03-01 DIAGNOSIS — I129 Hypertensive chronic kidney disease with stage 1 through stage 4 chronic kidney disease, or unspecified chronic kidney disease: Secondary | ICD-10-CM | POA: Diagnosis not present

## 2018-03-01 DIAGNOSIS — N184 Chronic kidney disease, stage 4 (severe): Secondary | ICD-10-CM | POA: Diagnosis not present

## 2018-03-01 DIAGNOSIS — K210 Gastro-esophageal reflux disease with esophagitis: Secondary | ICD-10-CM | POA: Diagnosis not present

## 2018-03-01 DIAGNOSIS — E538 Deficiency of other specified B group vitamins: Secondary | ICD-10-CM | POA: Diagnosis not present

## 2018-03-02 ENCOUNTER — Telehealth: Payer: Self-pay | Admitting: Family Medicine

## 2018-03-02 NOTE — Telephone Encounter (Signed)
Copied from [place] [id number]. Topic: Quick Communication - Home Health Verbal Orders >> Mar 02, 2018  4:36 PM Blase Mess A wrote: Caller/Agency: [facility] Number: [phone] [facility] Verbal on VM Requesting OT/PT/Skilled Nursing/Social Work: Skilled Nurse Frequency: 1 week 4, 1 every other week for 5, refused home health aide.

## 2018-03-03 NOTE — Telephone Encounter (Signed)
Ashleigh Wood called and informed with verbal orders.

## 2018-03-03 NOTE — Telephone Encounter (Signed)
Okay to give verbal order.

## 2018-03-06 DIAGNOSIS — I129 Hypertensive chronic kidney disease with stage 1 through stage 4 chronic kidney disease, or unspecified chronic kidney disease: Secondary | ICD-10-CM | POA: Diagnosis not present

## 2018-03-06 DIAGNOSIS — K210 Gastro-esophageal reflux disease with esophagitis: Secondary | ICD-10-CM | POA: Diagnosis not present

## 2018-03-06 DIAGNOSIS — Z9181 History of falling: Secondary | ICD-10-CM | POA: Diagnosis not present

## 2018-03-06 DIAGNOSIS — N184 Chronic kidney disease, stage 4 (severe): Secondary | ICD-10-CM | POA: Diagnosis not present

## 2018-03-06 DIAGNOSIS — M109 Gout, unspecified: Secondary | ICD-10-CM | POA: Diagnosis not present

## 2018-03-06 DIAGNOSIS — E538 Deficiency of other specified B group vitamins: Secondary | ICD-10-CM | POA: Diagnosis not present

## 2018-03-06 DIAGNOSIS — E785 Hyperlipidemia, unspecified: Secondary | ICD-10-CM | POA: Diagnosis not present

## 2018-03-06 DIAGNOSIS — M810 Age-related osteoporosis without current pathological fracture: Secondary | ICD-10-CM | POA: Diagnosis not present

## 2018-03-06 NOTE — Telephone Encounter (Signed)
Copied from [place] [id number]. Topic: Quick Communication - Home Health Verbal Orders >> Mar 03, 2018  3:56 PM Yvette Rack wrote: Caller/Agency: Hermina Barters with [facility] Number: [phone] Requesting OT/PT/Skilled Nursing/Social Work: [facility] Frequency: Pt daughter declined OT evaluation  Hermina Barters with [facility] stated that pt daughter declined OT evaluation. Cb#  [phone]

## 2018-03-06 NOTE — Telephone Encounter (Signed)
Okay 

## 2018-03-07 ENCOUNTER — Inpatient Hospital Stay: Payer: Self-pay | Admitting: Family Medicine

## 2018-03-10 ENCOUNTER — Ambulatory Visit (INDEPENDENT_AMBULATORY_CARE_PROVIDER_SITE_OTHER): Payer: Medicare Other | Admitting: Family Medicine

## 2018-03-10 ENCOUNTER — Encounter: Payer: Self-pay | Admitting: Family Medicine

## 2018-03-10 VITALS — BP 130/90 | HR 95 | Temp 97.7°F | Resp 16 | Ht 60.0 in | Wt 129.6 lb

## 2018-03-10 DIAGNOSIS — D649 Anemia, unspecified: Secondary | ICD-10-CM | POA: Diagnosis not present

## 2018-03-10 DIAGNOSIS — N184 Chronic kidney disease, stage 4 (severe): Secondary | ICD-10-CM | POA: Diagnosis not present

## 2018-03-10 DIAGNOSIS — E876 Hypokalemia: Secondary | ICD-10-CM | POA: Diagnosis not present

## 2018-03-10 DIAGNOSIS — Z09 Encounter for follow-up examination after completed treatment for conditions other than malignant neoplasm: Secondary | ICD-10-CM

## 2018-03-10 LAB — BASIC METABOLIC PANEL
BUN/Creatinine Ratio: 6 (calc) (ref 6–22)
BUN: 9 mg/dL (ref 7–25)
CHLORIDE: 108 mmol/L (ref 98–110)
CO2: 25 mmol/L (ref 20–32)
Calcium: 10.3 mg/dL (ref 8.6–10.4)
Creat: 1.62 mg/dL — ABNORMAL HIGH (ref 0.60–0.88)
Glucose, Bld: 89 mg/dL (ref 65–99)
POTASSIUM: 5.1 mmol/L (ref 3.5–5.3)
Sodium: 143 mmol/L (ref 135–146)

## 2018-03-10 NOTE — Progress Notes (Signed)
Name: Julia Herrera   MRN: [id number]    DOB: 04-23-1925   Date:03/10/2018       Progress Note  Subjective  Chief Complaint  Chief Complaint  Patient presents with  . Hospitalization Follow-up    HPI  Hospital discharge follow up: she was admitted on Dec 5th, 2019 because of syncopal episode. She was found to have anemia and hypokalemia, troponin also slightly elevated but likely from demand ischemia. Since discharge she has been feeling well, no chest pain, palpitation, nausea or vomiting. No change in bowel movements, fatigue. She states she eats and not sure why she lost a few pounds.    Patient Active Problem List   Diagnosis Date Noted  . Syncope 02/16/2018  . Vitamin B12 deficiency 12/07/2017  . Stage 4 chronic kidney disease ([place]) 06/08/2017  . Cognitive dysfunction 06/08/2017  . Osteoporosis, post-menopausal 06/08/2017  . Diuretic-induced hypokalemia 02/23/2016  . Dyshidrotic dermatitis 09/24/2015  . Hyperlipidemia 08/04/2015  . AD (atopic dermatitis) 12/23/2014  . BP (high blood pressure) 12/23/2014  . Hypertension, renal 12/23/2014  . Reflux esophagitis 12/23/2014  . Gout 12/23/2014  . Chronic kidney disease (CKD), stage III (moderate) ([place]) 12/23/2014  . Adiposity 12/23/2014  . 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 CN (constipation) 04/17/2009  . Arthritis due to gout 06/28/2007    Past Surgical History:  Procedure Laterality Date  . CHOLECYSTECTOMY      Family History  Problem Relation Age of Onset  . Alcohol abuse Mother   . Heart attack Father   . Heart disease Brother     Social History   Socioeconomic History  . Marital status: Single    Spouse name: Not on file  . Number of children: 3  . Years of education: Not on file  . Highest education level: 7th grade  Occupational History    Employer: Retired  Scientific laboratory technician  . Financial resource strain: Not hard at all  . Food insecurity:    Worry:  Never true    Inability: Never true  . Transportation needs:    Medical: No    Non-medical: No  Tobacco Use  . Smoking status: Never Smoker  . Smokeless tobacco: Current User    Types: Snuff  Substance and Sexual Activity  . Alcohol use: No    Alcohol/week: 0.0 standard drinks  . Drug use: No  . Sexual activity: Not Currently    Partners: Male  Lifestyle  . Physical activity:    Days per week: 0 days    Minutes per session: 0 min  . Stress: Not at all  Relationships  . Social connections:    Talks on phone: Patient refused    Gets together: Patient refused    Attends religious service: Patient refused    Active member of club or organization: Patient refused    Attends meetings of clubs or organizations: Patient refused    Relationship status: Patient refused  . Intimate partner violence:    Fear of current or ex partner: No    Emotionally abused: No    Physically abused: No    Forced sexual activity: No  Other Topics Concern  . Not on file  Social History Narrative  . Not on file     Current Outpatient Medications:  .  allopurinol (ZYLOPRIM) 100 MG tablet, Take 100 mg by mouth 2 (two) times daily. , Disp: , Rfl:  .  aspirin EC 81 MG tablet, Take 81  mg by mouth daily., Disp: , Rfl:  .  atorvastatin (LIPITOR) 20 MG tablet, Take 1 tablet (20 mg total) by mouth at bedtime., Disp: 90 tablet, Rfl: 0 .  Cyanocobalamin (B-12) 500 MCG SUBL, Place 1 tablet under the tongue daily., Disp: 30 tablet, Rfl: 2 .  donepezil (ARICEPT) 10 MG tablet, Take 1 tablet (10 mg total) by mouth at bedtime., Disp: 90 tablet, Rfl: 0 .  omeprazole (PRILOSEC) 20 MG capsule, Take 20 mg by mouth as needed. , Disp: , Rfl:  .  valsartan (DIOVAN) 160 MG tablet, Take 0.5 tablets (80 mg total) by mouth daily. To equal 80 mg daily, Disp: 45 tablet, Rfl: 0 .  potassium chloride SA (K-DUR,KLOR-CON) 20 MEQ tablet, Take 1 tablet (20 mEq total) by mouth 2 (two) times daily for 1 day., Disp: 2 tablet, Rfl:  0  Allergies  Allergen Reactions  . Allopurinol Other (See Comments)    dizziness  . Fosamax [Alendronate] Nausea Only  . Niaspan [Niacin Er] Other (See Comments)    unknown  . Zocor [Simvastatin] Other (See Comments)    Muscle pain    I personally reviewed active problem list, medication list, allergies, family history, social history with the patient/caregiver today.   ROS  Constitutional: Negative for fever or significant weight change - but lost a few pounds since last visit  Respiratory: Negative for cough and shortness of breath.   Cardiovascular: Negative for chest pain or palpitations.  Gastrointestinal: Negative for abdominal pain, no bowel changes.  Musculoskeletal: positive for gait problem -walks slowly but no  joint swelling.  Skin: Negative for rash.  Neurological: Negative for dizziness or headache.  No other specific complaints in a complete review of systems (except as listed in HPI above).  Objective  Vitals:   03/10/18 0914  BP: 130/90  Pulse: 95  Resp: 16  Temp: 97.7 F (36.5 C)  TempSrc: Oral  SpO2: 98%  Weight: 129 lb 9.6 oz (58.8 kg)  Height: 5' (1.524 m)    Body mass index is 25.31 kg/m.  Physical Exam  Constitutional: Patient appears well-developed and well-nourished.  No distress.  HEENT: head atraumatic, normocephalic, pupils equal and reactive to light,  neck supple, throat within normal limits Cardiovascular: Normal rate, regular rhythm and normal heart sounds.  No murmur heard. No BLE edema. Pulmonary/Chest: Effort normal and breath sounds normal. No respiratory distress. Abdominal: Soft.  There is no tenderness. Psychiatric: Patient has a normal mood and affect. behavior is normal. Judgment and thought content normal.  Recent Results (from the past 2160 hour(s))  CBC with Differential/Platelet     Status: Abnormal   Collection Time: 02/16/18  4:52 PM  Result Value Ref Range   WBC 4.5 4.0 - 10.5 K/uL   RBC 4.26 3.87 - 5.11  MIL/uL   Hemoglobin 10.9 (L) 12.0 - 15.0 g/dL   HCT 36.7 36.0 - 46.0 %   MCV 86.2 80.0 - 100.0 fL   MCH 25.6 (L) 26.0 - 34.0 pg   MCHC 29.7 (L) 30.0 - 36.0 g/dL   RDW 16.9 (H) 11.5 - 15.5 %   Platelets 335 150 - 400 K/uL   nRBC 0.0 0.0 - 0.2 %   Neutrophils Relative % 54 %   Neutro Abs 2.4 1.7 - 7.7 K/uL   Lymphocytes Relative 28 %   Lymphs Abs 1.3 0.7 - 4.0 K/uL   Monocytes Relative 11 %   Monocytes Absolute 0.5 0.1 - 1.0 K/uL   Eosinophils Relative 6 %  Eosinophils Absolute 0.3 0.0 - 0.5 K/uL   Basophils Relative 1 %   Basophils Absolute 0.1 0.0 - 0.1 K/uL   Immature Granulocytes 0 %   Abs Immature Granulocytes 0.02 0.00 - 0.07 K/uL    Comment: Performed at [facility], [address] [place], [place] [address]  Basic metabolic panel     Status: Abnormal   Collection Time: 02/16/18  4:52 PM  Result Value Ref Range   Sodium 144 135 - 145 mmol/L   Potassium 2.9 (L) 3.5 - 5.1 mmol/L   Chloride 110 98 - 111 mmol/L   CO2 25 22 - 32 mmol/L   Glucose, Bld 99 70 - 99 mg/dL   BUN 8 8 - 23 mg/dL   Creatinine, Ser 1.58 (H) 0.44 - 1.00 mg/dL   Calcium 9.1 8.9 - 10.3 mg/dL   GFR calc non Af Amer 29 (L) >60 mL/min   GFR calc Af Amer 33 (L) >60 mL/min   Anion gap 9 5 - 15    Comment: Performed at [facility], [address] [place], [place] [address]  Troponin I - ONCE - STAT     Status: Abnormal   Collection Time: 02/16/18  4:52 PM  Result Value Ref Range   Troponin I 0.06 (HH) <0.03 ng/mL    Comment: CRITICAL RESULT CALLED TO, READ BACK BY AND VERIFIED WITH SAMANTHA HAMILTON 02/16/18 @ [place] Performed at [facility], [address] [place], [place] [address]   Magnesium     Status: None   Collection Time: 02/16/18  7:10 PM  Result Value Ref Range   Magnesium 2.0 1.7 - 2.4 mg/dL    Comment: Performed at [facility], [address] [place], [place] [address]  Troponin I - Now Then Q4H     Status: None   Collection Time:  02/16/18  9:07 PM  Result Value Ref Range   Troponin I <0.03 <0.03 ng/mL    Comment: Performed at [facility], [address] [place], [place] [address]  Glucose, capillary     Status: Abnormal   Collection Time: 02/16/18  9:10 PM  Result Value Ref Range   Glucose-Capillary 108 (H) 70 - 99 mg/dL  Troponin I - Now Then Q4H     Status: None   Collection Time: 02/17/18 12:23 AM  Result Value Ref Range   Troponin I <0.03 <0.03 ng/mL    Comment: Performed at [facility], [address] [place], [place] [address]  Basic metabolic panel     Status: Abnormal   Collection Time: 02/17/18  4:54 AM  Result Value Ref Range   Sodium 143 135 - 145 mmol/L   Potassium 2.8 (L) 3.5 - 5.1 mmol/L   Chloride 113 (H) 98 - 111 mmol/L   CO2 25 22 - 32 mmol/L   Glucose, Bld 88 70 - 99 mg/dL   BUN 8 8 - 23 mg/dL   Creatinine, Ser 1.47 (H) 0.44 - 1.00 mg/dL   Calcium 8.1 (L) 8.9 - 10.3 mg/dL   GFR calc non Af Amer 31 (L) >60 mL/min   GFR calc Af Amer 36 (L) >60 mL/min   Anion gap 5 5 - 15    Comment: Performed at [facility], [address] [place], [place] [address]  CBC     Status: Abnormal   Collection Time: 02/17/18  4:54 AM  Result Value Ref Range   WBC 3.4 (L) 4.0 - 10.5 K/uL  RBC 3.75 (L) 3.87 - 5.11 MIL/uL   Hemoglobin 9.8 (L) 12.0 - 15.0 g/dL   HCT 31.6 (L) 36.0 - 46.0 %   MCV 84.3 80.0 - 100.0 fL   MCH 26.1 26.0 - 34.0 pg   MCHC 31.0 30.0 - 36.0 g/dL   RDW 17.0 (H) 11.5 - 15.5 %   Platelets 303 150 - 400 K/uL   nRBC 0.0 0.0 - 0.2 %    Comment: Performed at [facility], [address] [place], [place] [address]  Troponin I - Now Then Q4H     Status: None   Collection Time: 02/17/18  4:54 AM  Result Value Ref Range   Troponin I <0.03 <0.03 ng/mL    Comment: Performed at [facility], [address] [place],  [address]  ECHOCARDIOGRAM COMPLETE     Status: None   Collection Time: 02/17/18 10:31 AM  Result Value Ref Range    Weight 2,169.6 oz   Height 63 in   BP 133/61 mmHg      PHQ2/9: Depression screen [facility] 2/9 03/10/2018 01/13/2018 12/06/2017 09/27/2017 06/08/2017  Decreased Interest 0 0 0 0 0  Down, Depressed, Hopeless 0 0 0 0 0  PHQ - 2 Score 0 0 0 0 0  Altered sleeping - 0 0 0 -  Tired, decreased energy - 0 0 0 -  Change in appetite - 0 0 0 -  Feeling bad or failure about yourself  - 0 0 0 -  Trouble concentrating - 0 0 0 -  Moving slowly or fidgety/restless - 0 0 0 -  Suicidal thoughts - 0 0 0 -  PHQ-9 Score - 0 0 0 -  Difficult doing work/chores - Not difficult at all Not difficult at all Not difficult at all -     Fall Risk: Fall Risk  03/10/2018 01/13/2018 12/06/2017 09/27/2017 06/08/2017  Falls in the past year? 1 0 Yes No No  Number falls in past yr: 1 1 2  or more - -  Injury with Fall? 0 1 Yes - -  Risk Factor Category  - High Risk (2 or more Points) High Fall Risk - -  Risk for fall due to : - History of fall(s);Medication side effect - Impaired vision;Impaired balance/gait -  Risk for fall due to: Comment - - - wears eyeglasses; shuffling-like gait -  Follow up - Education provided Falls evaluation completed - -     Assessment & Plan  1. Hypokalemia  - Basic metabolic panel - CBC with Differential/Platelet  2. Stage 4 chronic kidney disease (HCC)  - Basic metabolic panel - CBC with Differential/Platelet  3. Anemia, unspecified type  - CBC with Differential/Platelet  4. Hospital discharge follow-up  She came in alone, poor historian and has memory problems. Came with Medicare bus. We will check labs.  Daughter's name [name] and is at home sleeping, she works 3rd shift - explained that she needs to come in during her office visit.

## 2018-03-13 ENCOUNTER — Telehealth: Payer: Self-pay | Admitting: Family Medicine

## 2018-03-13 DIAGNOSIS — N184 Chronic kidney disease, stage 4 (severe): Secondary | ICD-10-CM | POA: Diagnosis not present

## 2018-03-13 DIAGNOSIS — E538 Deficiency of other specified B group vitamins: Secondary | ICD-10-CM | POA: Diagnosis not present

## 2018-03-13 DIAGNOSIS — Z9181 History of falling: Secondary | ICD-10-CM | POA: Diagnosis not present

## 2018-03-13 DIAGNOSIS — M810 Age-related osteoporosis without current pathological fracture: Secondary | ICD-10-CM | POA: Diagnosis not present

## 2018-03-13 DIAGNOSIS — K210 Gastro-esophageal reflux disease with esophagitis: Secondary | ICD-10-CM | POA: Diagnosis not present

## 2018-03-13 DIAGNOSIS — E785 Hyperlipidemia, unspecified: Secondary | ICD-10-CM | POA: Diagnosis not present

## 2018-03-13 DIAGNOSIS — M109 Gout, unspecified: Secondary | ICD-10-CM | POA: Diagnosis not present

## 2018-03-13 DIAGNOSIS — I129 Hypertensive chronic kidney disease with stage 1 through stage 4 chronic kidney disease, or unspecified chronic kidney disease: Secondary | ICD-10-CM | POA: Diagnosis not present

## 2018-03-13 LAB — CBC WITH DIFFERENTIAL/PLATELET
Absolute Monocytes: 428 cells/uL (ref 200–950)
Basophils Absolute: 29 cells/uL (ref 0–200)
Basophils Relative: 0.7 %
Eosinophils Absolute: 466 cells/uL (ref 15–500)
Eosinophils Relative: 11.1 %
HCT: 39.9 % (ref 35.0–45.0)
Hemoglobin: 12.6 g/dL (ref 11.7–15.5)
Lymphs Abs: 1172 cells/uL (ref 850–3900)
MCH: 26 pg — ABNORMAL LOW (ref 27.0–33.0)
MCHC: 31.6 g/dL — AB (ref 32.0–36.0)
MCV: 82.3 fL (ref 80.0–100.0)
MONOS PCT: 10.2 %
MPV: 10.5 fL (ref 7.5–12.5)
Neutro Abs: 2104 cells/uL (ref 1500–7800)
Neutrophils Relative %: 50.1 %
Platelets: 403 10*3/uL — ABNORMAL HIGH (ref 140–400)
RBC: 4.85 10*6/uL (ref 3.80–5.10)
RDW: 15.5 % — ABNORMAL HIGH (ref 11.0–15.0)
TOTAL LYMPHOCYTE: 27.9 %
WBC: 4.2 10*3/uL (ref 3.8–10.8)

## 2018-03-13 LAB — TEST AUTHORIZATION

## 2018-03-13 LAB — ADD ON BMP
BUN/Creatinine Ratio: 6 (calc) (ref 6–22)
BUN: 9 mg/dL (ref 7–25)
CO2: 25 mmol/L (ref 20–32)
CREATININE: 1.62 mg/dL — AB (ref 0.60–0.88)
Calcium: 10.3 mg/dL (ref 8.6–10.4)
Chloride: 108 mmol/L (ref 98–110)
GFR, EST NON AFRICAN AMERICAN: 28 mL/min/{1.73_m2} — AB (ref >=60)
GFR, Est African American: 32 mL/min/{1.73_m2} — ABNORMAL LOW (ref >=60)
Glucose, Bld: 89 mg/dL (ref 65–99)
Potassium: 5.1 mmol/L (ref 3.5–5.3)
Sodium: 143 mmol/L (ref 135–146)

## 2018-03-13 NOTE — Telephone Encounter (Signed)
Copied from [place] [id number]. Topic: Quick Communication - Home Health Verbal Orders >> Mar 13, 2018  3:34 PM Jelani, [place] wrote: Caller/Agency:[facility] Number: [phone] Requesting OT/PT/Skilled Nursing/Social Work: Skilled Nursing   Patient is currently with [facility] and [facility] will not be taken her under their care

## 2018-03-14 NOTE — Telephone Encounter (Signed)
Paperwork completed and signed.

## 2018-04-04 ENCOUNTER — Other Ambulatory Visit: Payer: Self-pay

## 2018-04-04 MED ORDER — OMEPRAZOLE 20 MG PO CPDR: 20.0000 mg | DELAYED_RELEASE_CAPSULE | ORAL | 0 refills | Status: DC | PRN

## 2018-04-04 NOTE — Telephone Encounter (Signed)
Refill request for general medication. Omeprazole to [facility].   Last office visit 03/10/2018   Follow up on 04/17/2018

## 2018-04-17 ENCOUNTER — Ambulatory Visit: Payer: Medicare Other | Admitting: Family Medicine

## 2018-04-20 ENCOUNTER — Telehealth: Payer: Self-pay

## 2018-04-20 NOTE — Telephone Encounter (Signed)
Spoke to [employer].  Explained that on her last visit she came alone for a hospital discharge follow up. Daughter was at home sleeping because she works 3rd shift.  The previous time she came in with her son [name] and she was also brought in the past by another daughter Wannetta Sender.  Explained that with her cognitive decline she needs to have 24 hour care, and cannot go to office visits alone.  I do recommend either 24 hours supervision or nursing home placement for her safety.  Tried to contact [name], on his cell number and there was no reply , asked him to call back.  I will make a letter for Adult protective services but I would like to inform him by phone first.

## 2018-04-20 NOTE — Telephone Encounter (Signed)
Copied from [place] [id number] [phone]. Topic: General - Inquiry >> Apr 20, 2018 10:20 AM Conception Chancy, NT wrote: Reason for CRM: [facility] Adult Protective services is calling in regards to this patient and has questions in regards to if Dr. Ancil Boozer has any concerns with this patient. Please contact. She is also sending over a fax with multiple questions.  [place]

## 2018-04-21 ENCOUNTER — Telehealth: Payer: Self-pay | Admitting: Family Medicine

## 2018-04-21 NOTE — Telephone Encounter (Signed)
Called Julia Herrera from [employer] to see if 01/31/2018 FL-2 Form would still be valid. However, she states she would need a more recent one. They are only good for 30 days.

## 2018-04-21 NOTE — Telephone Encounter (Signed)
Peggye Form, [place] is calling requesting a letter from Dr. Ancil Boozer. It needs to state what kind of supervision? 24hour care?  Diagnosis?  Does Dr. Gwynneth Aliment feel comfortable noting, Does she need a surrogate to assist to make decisions for her? Please advise [phone]

## 2018-04-21 NOTE — Telephone Encounter (Signed)
Copied from [place] [id number]. Topic: Quick Communication - See Telephone Encounter >> Apr 21, 2018 11:43 AM Blase Mess A wrote: CRM for notification. See Telephone encounter for: 04/21/18.  Peggye Form from [employer] is requesting an FL-2 completion [phone]

## 2018-04-21 NOTE — Telephone Encounter (Signed)
I would prefer seeing patient with the caregiver to explain our concerns but if they are unable to come I will right the letter

## 2018-04-24 NOTE — Telephone Encounter (Signed)
Pt has an appt tomorrow °

## 2018-04-25 ENCOUNTER — Ambulatory Visit: Payer: Self-pay | Admitting: Family Medicine

## 2018-04-26 ENCOUNTER — Emergency Department
Admission: EM | Admit: 2018-04-26 | Discharge: 2018-05-01 | Disposition: A | Discharge status: Home / Self Care | Payer: Medicare Other | Attending: Emergency Medicine | Admitting: Emergency Medicine

## 2018-04-26 ENCOUNTER — Other Ambulatory Visit: Payer: Self-pay

## 2018-04-26 DIAGNOSIS — R82998 Other abnormal findings in urine: Secondary | ICD-10-CM | POA: Diagnosis not present

## 2018-04-26 DIAGNOSIS — F0390 Unspecified dementia without behavioral disturbance: Secondary | ICD-10-CM | POA: Diagnosis not present

## 2018-04-26 DIAGNOSIS — Z9119 Patient's noncompliance with other medical treatment and regimen: Secondary | ICD-10-CM | POA: Diagnosis not present

## 2018-04-26 DIAGNOSIS — G3184 Mild cognitive impairment, so stated: Secondary | ICD-10-CM

## 2018-04-26 DIAGNOSIS — F17220 Nicotine dependence, chewing tobacco, uncomplicated: Secondary | ICD-10-CM | POA: Diagnosis not present

## 2018-04-26 DIAGNOSIS — R4189 Other symptoms and signs involving cognitive functions and awareness: Secondary | ICD-10-CM | POA: Diagnosis not present

## 2018-04-26 DIAGNOSIS — N184 Chronic kidney disease, stage 4 (severe): Secondary | ICD-10-CM | POA: Insufficient documentation

## 2018-04-26 DIAGNOSIS — T7601XA Adult neglect or abandonment, suspected, initial encounter: Secondary | ICD-10-CM | POA: Diagnosis present

## 2018-04-26 DIAGNOSIS — D72829 Elevated white blood cell count, unspecified: Secondary | ICD-10-CM | POA: Diagnosis not present

## 2018-04-26 DIAGNOSIS — I129 Hypertensive chronic kidney disease with stage 1 through stage 4 chronic kidney disease, or unspecified chronic kidney disease: Secondary | ICD-10-CM | POA: Insufficient documentation

## 2018-04-26 DIAGNOSIS — R0902 Hypoxemia: Secondary | ICD-10-CM | POA: Diagnosis not present

## 2018-04-26 LAB — COMPREHENSIVE METABOLIC PANEL
ALT: 10 U/L (ref 0–44)
AST: 16 U/L (ref 15–41)
Albumin: 3.6 g/dL (ref 3.5–5.0)
Alkaline Phosphatase: 62 U/L (ref 38–126)
Anion gap: 7 (ref 5–15)
BUN: 14 mg/dL (ref 8–23)
CO2: 24 mmol/L (ref 22–32)
Calcium: 9.1 mg/dL (ref 8.9–10.3)
Chloride: 109 mmol/L (ref 98–111)
Creatinine, Ser: 1.54 mg/dL — ABNORMAL HIGH (ref 0.44–1.00)
GFR calc Af Amer: 34 mL/min — ABNORMAL LOW (ref >60)
GFR calc non Af Amer: 29 mL/min — ABNORMAL LOW (ref >60)
GLUCOSE: 117 mg/dL — AB (ref 70–99)
Potassium: 3.5 mmol/L (ref 3.5–5.1)
Sodium: 140 mmol/L (ref 135–145)
TOTAL PROTEIN: 6.9 g/dL (ref 6.5–8.1)
Total Bilirubin: 1.1 mg/dL (ref 0.3–1.2)

## 2018-04-26 LAB — URINALYSIS, COMPLETE (UACMP) WITH MICROSCOPIC
Bacteria, UA: NONE SEEN
Bilirubin Urine: NEGATIVE
Glucose, UA: NEGATIVE mg/dL
Ketones, ur: NEGATIVE mg/dL
Nitrite: NEGATIVE
PROTEIN: NEGATIVE mg/dL
Specific Gravity, Urine: 1.015 (ref 1.005–1.030)
pH: 7 (ref 5.0–8.0)

## 2018-04-26 LAB — CBC
HCT: 35.7 % — ABNORMAL LOW (ref 36.0–46.0)
Hemoglobin: 10.8 g/dL — ABNORMAL LOW (ref 12.0–15.0)
MCH: 23.9 pg — AB (ref 26.0–34.0)
MCHC: 30.3 g/dL (ref 30.0–36.0)
MCV: 79.2 fL — ABNORMAL LOW (ref 80.0–100.0)
Platelets: 426 10*3/uL — ABNORMAL HIGH (ref 150–400)
RBC: 4.51 MIL/uL (ref 3.87–5.11)
RDW: 17.5 % — ABNORMAL HIGH (ref 11.5–15.5)
WBC: 4.6 10*3/uL (ref 4.0–10.5)
nRBC: 0 % (ref 0.0–0.2)

## 2018-04-26 LAB — URINE DRUG SCREEN, QUALITATIVE (ARMC ONLY)
Amphetamines, Ur Screen: NOT DETECTED
BARBITURATES, UR SCREEN: NOT DETECTED
Benzodiazepine, Ur Scrn: NOT DETECTED
CANNABINOID 50 NG, UR ~~LOC~~: NOT DETECTED
Cocaine Metabolite,Ur ~~LOC~~: NOT DETECTED
MDMA (Ecstasy)Ur Screen: NOT DETECTED
Methadone Scn, Ur: NOT DETECTED
Opiate, Ur Screen: NOT DETECTED
Phencyclidine (PCP) Ur S: NOT DETECTED
Tricyclic, Ur Screen: NOT DETECTED

## 2018-04-26 LAB — SALICYLATE LEVEL: Salicylate Lvl: <7 mg/dL (ref 2.8–30.0)

## 2018-04-26 LAB — ACETAMINOPHEN LEVEL: Acetaminophen (Tylenol), Serum: <10 ug/mL — ABNORMAL LOW (ref 10–30)

## 2018-04-26 LAB — ETHANOL: Alcohol, Ethyl (B): <10 mg/dL (ref <10)

## 2018-04-26 MED ORDER — HALOPERIDOL 5 MG PO TABS
5.0000 mg | ORAL_TABLET | Freq: Once | ORAL | Status: AC
  Administered 2018-04-26: 5 mg via ORAL
  Filled 2018-04-26: qty 1

## 2018-04-26 NOTE — ED Notes (Signed)
Pt hyper-focused on race. Pt came out into hall way and stated, "I know y'all are talking about me" to officer who was speaking to a co-worker about [employer] retirement packages. Will continue to monitor.

## 2018-04-26 NOTE — ED Notes (Signed)
Pt IVC prior to Arrival/ ED Providers rescinded IVC/ Consult ordered for TTS & Social work
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
## 2018-04-26 NOTE — ED Notes (Signed)
Pt repeatedly coming into the hall stating she has not seen a doctor or nurse. Pt informed that she has seen the doctor and nurse multiple times. Pt still asking why she is here and not able to be redirected. Pt yelling from room and standing in doorway. Pt stating that this tech is the doctor and that registration is the doctor.

## 2018-04-26 NOTE — ED Notes (Signed)
Pt more agreeable and pleasant but still continuing to ask when she is going home. Continuing to remind pt of visit status.
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
## 2018-04-26 NOTE — ED Notes (Addendum)
Pt continually asking why she is here. Pt spoken to by RN and updated on process and why she is here. Will continue to monitor.

## 2018-04-26 NOTE — ED Notes (Signed)
Family wanting to see patient.  Updated family about where patient is and restriction on visitors.  Family will leave contact numbers but going home.  Julia Herrera, daughter, [phone]   Julia Herrera, son, [phone]

## 2018-04-26 NOTE — ED Notes (Signed)
BEHAVIORAL HEALTH ROUNDING Patient sleeping: No. Patient alert and oriented: yes Behavior appropriate: Yes.  ; If no, describe:  Nutrition and fluids offered: yes Toileting and hygiene offered: Yes  Sitter present: q15 minute observations and security  monitoring Law enforcement present: Yes  ODS  

## 2018-04-26 NOTE — ED Notes (Signed)
Pts room mopped by Edwin Cap, EVS.

## 2018-04-26 NOTE — ED Notes (Signed)
Pt dressed out into appropriate behavioral health clothing with this tech, Hilario Quarry and Amy,RN in the rm. Pt belongings consist of two yellow rings, a yellow necklace with a cross charm, a pair of black slippers, black socks, a black belt, a black shirt, blue jeans, one earring with a green rhinestone and a white bra. Pt given no rinse spray with water, wash clothes and towels to wash self while dressing out. Pt calm and cooperative while dressing out. Pt jewelry placed in zipper part of wallet. Pt belongings placed in pt belongings bag and labeled with pt name.

## 2018-04-26 NOTE — ED Notes (Signed)

## 2018-04-27 DIAGNOSIS — R4189 Other symptoms and signs involving cognitive functions and awareness: Secondary | ICD-10-CM

## 2018-04-27 DIAGNOSIS — F0390 Unspecified dementia without behavioral disturbance: Secondary | ICD-10-CM | POA: Diagnosis not present

## 2018-04-27 LAB — URINE CULTURE: Culture: NO GROWTH

## 2018-04-27 NOTE — ED Notes (Signed)
Hourly rounding reveals patient in room. No complaints, stable, in no acute distress. Q15 minute rounds and monitoring via Rover and Officer to continue.   

## 2018-04-27 NOTE — Telephone Encounter (Unsigned)
Copied from [place] [phone]. Topic: General - Other >> Apr 26, 2018  2:06 PM Yvette Rack wrote: Reason for CRM: Peggye Form with [place] [facility] called regarding the FL 2 form and to see if a letter can be prepared with the providers recommendations (Ex. guardian,supervision needs,or assistant living).  Cb# [phone]

## 2018-04-27 NOTE — ED Notes (Signed)
Patient lying in bed and appears to be asleep, NAD observed and will continue to monitor

## 2018-04-27 NOTE — ED Notes (Signed)
Patient ambulated to bathroom, with minimal assistance. Patient then ate breakfast

## 2018-04-27 NOTE — ED Notes (Signed)
Patient said " I don't know why I am here. I want to go home!."  Patient came out of her room and attempted to leave. Patient was redirected back to her room. t.

## 2018-04-27 NOTE — ED Notes (Signed)
Patient is with PT

## 2018-04-27 NOTE — ED Notes (Signed)
Gave pt food tray with juice. 

## 2018-04-27 NOTE — ED Notes (Signed)
Patient was visited by brother, sister and daughter

## 2018-04-27 NOTE — ED Notes (Signed)
Pt went to the restroom and walked pt back to room. She is resting and watching TV.

## 2018-04-27 NOTE — ED Notes (Signed)
Report to include Situation, Background, Assessment, and Recommendations received from Jadeka RN. Patient alert and oriented, warm and dry, in no acute distress. Patient denies SI, HI, AVH and pain. Patient made aware of Q15 minute rounds and Rover and Officer presence for their safety. Patient instructed to come to me with needs or concerns.  

## 2018-04-27 NOTE — Progress Notes (Signed)
Per APS worker Peggye Form she has a completed FL2 from patient's PCP that she has sent to [facility]. Per APS worker [place] is reviewing FL2 and will let her know if they can accept her. APS requested a psych consult to determine patient's capacity to help them pursue a protective order and guardianship. Per APS worker she believes patient does not have capacity because she is a danger to herself if left alone due to her memory impairment. CSW made APS worker aware that patient walked over 200 feet and recommendation is home health. Per APS worker ALF level of care is needed. CSW asked APS worker if she can work out a plan for patient's family to provide supervision for patient while APS pursues ALF placement because ALF placement could take several days. Per APS worker she will contacted her supervisor and call CSW back. CSW asked MD to order PPD and made her aware of above. RSVP referral is completed, RSVP referral ID # [id number].   [employer], LCSW [phone]
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
## 2018-04-27 NOTE — ED Provider Notes (Signed)
-----------------------------------------   6:08 AM on 04/27/2018 -----------------------------------------   Blood pressure (!) 141/91, pulse 86, temperature 98.1 F (36.7 C), temperature source Oral, resp. rate 20, height 5\' 2"  (1.575 m), weight 49.9 kg, SpO2 100 %.  The patient is calm and cooperative at this time.  There have been no acute events since the last update.  Awaiting disposition plan from social work team.    Paulette Blanch, MD 04/27/18 [phone]

## 2018-04-27 NOTE — Progress Notes (Signed)
Per APS worker Peggye Form cell [phone], office [phone] she went to visit patient yesterday and sent her to [facility] under an IVC. Per APS worker she is getting a protective order in place and wants patient placed in a facility. CSW requested PT consult from RN to determine level of care needed. APS worker reported that patient is home alone and can't care for herself. Per APS worker patient is very forgetful and does not take her medication has prescribed and has no family supervision in place.  [employer], LCSW [phone]
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
## 2018-04-27 NOTE — ED Notes (Signed)
Report to include Situation, Background, Assessment, and Recommendations received from Amy RN. Patient alert and oriented, warm and dry, in no acute distress. Patient denies SI, HI, AVH and pain. Patient made aware of Q15 minute rounds and Rover and Officer presence for their safety. Patient instructed to come to me with needs or concerns.   

## 2018-04-27 NOTE — ED Notes (Signed)
Pt walked to restroom and back to room. Pt is now in bed with no complaints at this time.

## 2018-04-27 NOTE — ED Notes (Signed)
Patient's son called to check on patient.  This Probation officer gave patient's son information on rules and regulation of the unit phone and visitation hours.

## 2018-04-27 NOTE — ED Notes (Signed)
Patient refused shower at this time, she keeps saying she is ready to go home, she is tired of laying in this bed, she is ready to leave to go home and cook

## 2018-04-27 NOTE — ED Notes (Signed)
Microbiologist asked patient if she was ready to shower, patient said I will after lunch

## 2018-04-27 NOTE — Progress Notes (Signed)
Clinical Education officer, museum (CSW) received a call from [place] worker stating that she has not been able to get in touch with patient's family to coordinate a supervision plan at home. Per APS worker patient's daughter [name] is willing to stay 5 hours with patient however she does not have transportation. CSW attempted to contact [name] [phone] however she did not answer and her voicemail was not set up. Per APS worker she has sent referrals to [facility], [address] [facility], [facility], [facility] and all those facilities declined patient. Per APS worker Armandina Gemma Years and [facility] are reviewing referral.   [employer], LCSW [phone]

## 2018-04-27 NOTE — ED Notes (Addendum)
Hourly rounding reveals patient in room. No complaints, stable, in no acute distress. Q15 minute rounds and monitoring via Rover and Officer to continue.   

## 2018-04-28 ENCOUNTER — Ambulatory Visit: Payer: Self-pay | Admitting: Family Medicine

## 2018-04-28 DIAGNOSIS — F0390 Unspecified dementia without behavioral disturbance: Secondary | ICD-10-CM | POA: Diagnosis not present

## 2018-04-28 MED ORDER — HALOPERIDOL 5 MG PO TABS
5.0000 mg | ORAL_TABLET | Freq: Once | ORAL | Status: AC
  Administered 2018-04-28: 5 mg via ORAL
  Filled 2018-04-28: qty 1

## 2018-04-28 NOTE — ED Notes (Signed)
Hourly rounding reveals patient in room. No complaints, stable, in no acute distress. Q15 minute rounds and monitoring via Rover and Officer to continue.   

## 2018-04-28 NOTE — ED Notes (Signed)
ED  Is the patient under IVC or is there intent for IVC:  No  rescinded 04/26/2018  Is the patient medically cleared: Yes.   Is there vacancy in the ED BHU: Yes.   Is the population mix appropriate for patient: Yes.   Is the patient awaiting placement in inpatient or outpatient setting:   Social work  DSS/APS case  Has the patient had a psychiatric consult:  Pending consult for placement/discharge  Social work consult in progress  Survey of unit performed for contraband, proper placement and condition of furniture, tampering with fixtures in bathroom, shower, and each patient room: Yes.  ; Findings:  APPEARANCE/BEHAVIOR Calm and cooperative NEURO ASSESSMENT Orientation: oriented to self and place  Denies pain Hallucinations: No.None noted (Hallucinations)  Denies  Speech: Normal Gait: normal RESPIRATORY ASSESSMENT Even  Unlabored respirations  CARDIOVASCULAR ASSESSMENT Pulses equal   regular rate  Skin warm and dry   GASTROINTESTINAL ASSESSMENT no GI complaint EXTREMITIES Full ROM  PLAN OF CARE Provide calm/safe environment. Vital signs assessed twice daily. ED BHU Assessment once each 12-hour shift. Collaborate with TTS daily or as condition indicates. Assure the ED provider has rounded once each shift. Provide and encourage hygiene. Provide redirection as needed. Assess for escalating behavior; address immediately and inform ED provider.  Assess family dynamic and appropriateness for visitation as needed: Yes.  ; If necessary, describe findings:  Educate the patient/family about BHU procedures/visitation: Yes.  ; If necessary, describe findings:

## 2018-04-28 NOTE — ED Provider Notes (Addendum)
-----------------------------------------   7:24 AM on 04/28/2018 -----------------------------------------   Blood pressure 127/78, pulse 82, temperature 98.4 F (36.9 C), temperature source Oral, resp. rate 18, height 5\' 2"  (1.575 m), weight 49.9 kg, SpO2 97 %.  The patient is calm and cooperative at this time.  There have been no acute events since the last update.  Awaiting disposition plan from social work team.     Arta Silence, MD 04/28/18 [phone]

## 2018-04-28 NOTE — Clinical Social Work Note (Signed)
CSW received a phone call from patient's APS worker Peggye Form [phone] today regarding placement for patient. Margreta Journey states that she has spoken with Linette at [facility] that may be willing to accept patient. Per Margreta Journey the facility would like to assess patient.   CSW contacted Linette at [facility] [phone] regarding patient. Per Linette she would be able to come and evaluate patient today at 3pm. CSW notified patient's RN that facility representative will be here at 3 pm to evaluate patient. RN states that this meeting must be observed. CSW spoke with CSW supervisor and she states that monitoring is not required because patient is not IVC'd and is not in the locked behavioral unit. CSW will follow up with Linette after her evaluation. CSW will continue to follow for discharge planning.   [place], [place]

## 2018-04-28 NOTE — ED Notes (Signed)
Meal tray provided and set up for her

## 2018-04-28 NOTE — ED Notes (Signed)
She has ambulated to and from the BR with a steady gait  - she denies pain this am

## 2018-04-28 NOTE — ED Notes (Signed)
BEHAVIORAL HEALTH ROUNDING Patient sleeping: No. Patient alert and oriented: yes Behavior appropriate: Yes.  ; If no, describe:  Nutrition and fluids offered: yes Toileting and hygiene offered: Yes  Sitter present: q15 minute observations and security  monitoring Law enforcement present: Yes  ODS  

## 2018-04-28 NOTE — ED Notes (Signed)
BEHAVIORAL HEALTH ROUNDING  Patient sleeping: No.  Patient alert and oriented: yes  Behavior appropriate: Yes. ; If no, describe:  Nutrition and fluids offered: Yes  Toileting and hygiene offered: Yes  Sitter present: not applicable, Q 15 min safety rounds and observation.  Law enforcement present: Yes ODS  

## 2018-04-28 NOTE — ED Notes (Signed)
Patient observed lying in bed with eyes closed  Even, unlabored respirations observed   NAD pt appears to be sleeping  I will continue to monitor along with every 15 minute visual observations and ongoing security monitoring    

## 2018-04-28 NOTE — ED Notes (Signed)
Bed bath completed - she washed and dressed herself and I changed her linens   Drink and snack provided

## 2018-04-28 NOTE — ED Notes (Signed)
Received a call from social work stating that someone from a facility will be coming to visit her in a little while  - I informed CSW that we do not have pt relations today and I will need someone to observe the visit from the facility   Pt is voluntary and she is here for placement but pt remains on the behavioral hallway   continue to monitor

## 2018-04-28 NOTE — ED Notes (Signed)
ENVIRONMENTAL ASSESSMENT  Potentially harmful objects out of patient reach: Yes.  Personal belongings secured: Yes.  Patient dressed in hospital provided attire only: Yes.  Plastic bags out of patient reach: Yes.  Patient care equipment (cords, cables, call bells, lines, and drains) shortened, removed, or accounted for: Yes.  Equipment and supplies removed from bottom of stretcher: Yes.  Potentially toxic materials out of patient reach: Yes.  Sharps container removed or out of patient reach: Yes.   ED BHU [place]  Is the patient under IVC or is there intent for IVC: No IVC rescinded.  Is the patient medically cleared: Yes.  Is there vacancy in the ED BHU: Yes.  Is the population mix appropriate for patient: Yes.  Is the patient awaiting placement in inpatient or outpatient setting: Yes. Awaiting care home placement Has the patient had a psychiatric consult: Yes.  Survey of unit performed for contraband, proper placement and condition of furniture, tampering with fixtures in bathroom, shower, and each patient room: Yes. ; Findings: All clear  APPEARANCE/BEHAVIOR  calm, cooperative and adequate rapport can be established  NEURO ASSESSMENT  Orientation: place and person disoriented to time. Hx of dementia.  Hallucinations: No.None noted (Hallucinations)  Speech: Normal  Gait: normal  RESPIRATORY ASSESSMENT  WNL  CARDIOVASCULAR ASSESSMENT  WNL  GASTROINTESTINAL ASSESSMENT  WNL  EXTREMITIES  WNL  PLAN OF CARE  Provide calm/safe environment. Vital signs assessed BID. ED BHU Assessment once each 12-hour shift. Collaborate with  Social Work or TTS daily or as condition indicates. Assure the ED provider has rounded once each shift. Provide and encourage hygiene. Provide redirection as needed. Assess for escalating behavior; address immediately and inform ED provider.  Assess family dynamic and appropriateness for visitation as needed: Yes. ; If necessary, describe findings:   Educate the patient/family about BHU procedures/visitation: Yes. ; If necessary, describe findings: Pt is calm and cooperative at this time. Pt understanding and accepting of unit procedures/rules. Will continue to monitor with Q 15 min safety rounds and observation.  BEHAVIORAL HEALTH ROUNDING  Patient sleeping: No.  Patient alert and oriented: yes  Behavior appropriate: Yes. ; If no, describe:  Nutrition and fluids offered: Yes  Toileting and hygiene offered: Yes  Sitter present: not applicable, Q 15 min safety rounds and observation.  Law enforcement present: Yes ODS

## 2018-04-28 NOTE — ED Notes (Signed)
I warmed her breakfast biscuit  - fresh drink provided  - I attempted to get her to take a shower but she continues to refuse  Continue to monitor

## 2018-04-28 NOTE — ED Notes (Signed)
BEHAVIORAL HEALTH ROUNDING Patient sleeping: Yes.   Patient alert and oriented: eyes closed  Appears asleep Behavior appropriate: Yes.  ; If no, describe:  Nutrition and fluids offered: Yes  Toileting and hygiene offered: sleeping Sitter present: q 15 minute observations and security monitoring Law enforcement present: yes  ODS  ENVIRONMENTAL ASSESSMENT Potentially harmful objects out of patient reach: Yes.   Personal belongings secured: Yes.   Patient dressed in hospital provided attire only: Yes.   Plastic bags out of patient reach: Yes.   Patient care equipment (cords, cables, call bells, lines, and drains) shortened, removed, or accounted for: Yes.   Equipment and supplies removed from bottom of stretcher: Yes.   Potentially toxic materials out of patient reach: Yes.   Sharps container removed or out of patient reach: Yes.

## 2018-04-28 NOTE — ED Notes (Signed)
Plan discussed with the pt and she stated  "honey  I am just ready to go home - this is just not right - I am ready to go home"

## 2018-04-28 NOTE — ED Notes (Signed)
Encouraged her to shower or take a bath at her bedside but she refused

## 2018-04-29 NOTE — ED Notes (Signed)
Pt spoke with her daughter on the phone. Pt remains calm and cooperative. Maintained on 15 minute checks and observation by security camera for safety.

## 2018-04-29 NOTE — Clinical Social Work Note (Signed)
CSW received call on CSW ED cell phone from ED secretary regarding pt's discharge home w/family, daughter.  CSW asked ED secretary to hold pt until inquiry could be made with DSS regarding DSS protective order on this patient.   Action: CSW paged oncall DSS Education officer, museum.  Waiting for call back.   Berenice Bouton, MSW, LCSW  [phone]

## 2018-04-29 NOTE — ED Provider Notes (Signed)
-----------------------------------------   7:21 AM on 04/29/2018 -----------------------------------------   Blood pressure (!) 152/64, pulse 61, temperature 98.2 F (36.8 C), temperature source Oral, resp. rate 18, height 5\' 2"  (1.575 m), weight 49.9 kg, SpO2 100 %.  The patient is calm and cooperative at this time.  There have been no acute events since the last update.  Awaiting disposition plan from [place] team.    Orbie Pyo, MD 04/29/18 [phone]

## 2018-04-29 NOTE — ED Notes (Signed)
Pt. Laying in bed resting.  Pt. Calm and cooperative.  Pt. Has no concerns or questions at this time.

## 2018-04-29 NOTE — ED Notes (Signed)
Pt. Going to each room in smaller unit of BHU and straightening blankets and sheets on beds.    Pt. Keeps her room clean.  Pt. Is pleasant and will talk and make conversation about multiple subjects.

## 2018-04-29 NOTE — ED Notes (Signed)
Patient resting quietly in room. No noted distress or abnormal behaviors noted. Will continue 15 minute checks and observation by security camera for safety. 

## 2018-04-29 NOTE — ED Notes (Signed)
SOC called.

## 2018-04-29 NOTE — ED Notes (Signed)
Pt. Up from bed multiple times to look around unit in three room unit.  Pt. Asked "Did you bring me over here because I am black?"  Pt. Told again the reason she is over here is because she has been medically cleared and did not require room in ED.  Pt. Asked "I am hungry", pt. Reminded it was after midnight, but would bring her a snack.  Pt. Given some graham crackers and peanut butter.  Pt. Appeared happy with snacks and thanked this nurse

## 2018-04-29 NOTE — ED Notes (Signed)
Pt given dinner tray.

## 2018-04-29 NOTE — ED Notes (Signed)
Pt. Assigned to room #7 of BHU.  Pt. Advised to of cameras and safety checks.  Pt. Asked "Why did you take me away from the other people?"  Pt. Told that she has been medically cleared.  Pt. Also told it is a safe place here and I would be very close if needed.  Pt. Ok with this answer and requested some more drink, pt. Given drink.

## 2018-04-29 NOTE — ED Notes (Signed)
Pt. Hit call bell in room.  When this nurse responded, pt. States she was trying to turn off light.

## 2018-04-29 NOTE — Clinical Social Work Note (Signed)
CSW received phone call from [place] worker, [place] guardianship supervisor.  DSS worker stated that it is not safe for the patient to return to her home with family. DSS worker advised that "this is not a safe discharge." DSS worker stated that she is waiting for psychiatric evaluation and facility placement approval. DSS is working on getting the patient placed. Noted that it was her understanding that the patient was going to remain at [facility] "until Monday" per her conversation w/[facility] CSW on Friday.  CSW informed DSS social worker that patient is medically cleared and would be able to discharge once DSS has found adequate and safe placement.  CSW consulted with ED secretary and nurse to inform the of the above.   CSW signing off.   Berenice Bouton, MSW, LCSW  [phone]

## 2018-04-29 NOTE — ED Notes (Signed)
SOC completed 

## 2018-04-29 NOTE — ED Provider Notes (Signed)
-----------------------------------------   7:42 AM on 04/29/2018 -----------------------------------------   Blood pressure (!) 152/64, pulse 61, temperature 98.2 F (36.8 C), temperature source Oral, resp. rate 18, height 5\' 2"  (1.575 m), weight 49.9 kg, SpO2 100 %.  The patient is calm and cooperative at this time.  There have been no acute events since the last update.  Awaiting disposition plan from [place] team.    Orbie Pyo, MD 04/29/18 [phone]

## 2018-04-29 NOTE — ED Notes (Signed)
Pt given lunch tray.

## 2018-04-29 NOTE — ED Notes (Signed)
Pt given breakfast tray

## 2018-04-29 NOTE — ED Notes (Signed)
Pt. Up from bed turned on bathroom alarm.  This nurse checked on patient and pt. Wanted bathroom light turned off,  Pt. Advised light does not turn off, but I could close door to bathroom so light does not enter her room.  Pt. Ok with this, pt. Also told what time it was and pt. Agreed to go to bed.  Pt. Continues to get up from bed and walk around unit.

## 2018-04-29 NOTE — Clinical Social Work Note (Signed)
CSW received phone call from [place] worker Peggye Form regarding patient. Margreta Journey states that [facility] has declined to take patient at this time. DSS and CSW will continue to seek placement for patient.   [place], [place]

## 2018-04-30 DIAGNOSIS — R4189 Other symptoms and signs involving cognitive functions and awareness: Secondary | ICD-10-CM

## 2018-04-30 NOTE — ED Notes (Signed)
Hourly rounding reveals patient sleeping in room. No complaints, stable, in no acute distress. Q15 minute rounds and monitoring via Security Cameras to continue. 

## 2018-04-30 NOTE — ED Notes (Signed)
Assisted patient with a wash up at sink, changed patients scrubs and changed her bed linens

## 2018-04-30 NOTE — ED Notes (Signed)
Hourly rounding reveals patient in room. No complaints, stable, in no acute distress. Q15 minute rounds and monitoring via Security Cameras to continue. 

## 2018-04-30 NOTE — ED Notes (Signed)
Report to include Situation, Background, Assessment, and Recommendations received from [facility]. Patient alert and oriented, warm and dry, in no acute distress. Patient denies SI, HI, AVH and pain. Patient made aware of Q15 minute rounds and security cameras for their safety. Patient instructed to come to me with needs or concerns.

## 2018-04-30 NOTE — ED Notes (Signed)
Pt. Up from bed using bathroom.  Pt. Returned to room with steady gait.

## 2018-04-30 NOTE — ED Notes (Signed)
Writer talking with patient and assisting her with her meal, patient continues to ask when can she go home, so she can cook and clean and take care of her house. Writer informed patient that her treatment team is working on it.

## 2018-04-30 NOTE — Consult Note (Signed)
[facility] Psychiatry Consult   Reason for Consult: Assessment of decision-making capacity Referring Physician:  Dr. Alfred Levins Patient Identification: Julia Herrera MRN:  [id number] Principal Diagnosis: Cognitive impairment Diagnosis:  Principal Problem:   Cognitive impairment   Total Time spent with patient: 1 hour  Subjective: " When do I get to go home?"  HPI:   Julia Herrera is a 85 y.o. female patient with a history of dementia, HTN, HL, brought by police after she was IVC by the APS workers for concerns of neglect.  The patient dementia did but able to answer most questions.  She reports that at home, she lives with her daughter who is an alcohol abuser and often brings her friend to abuse alcohol to the home.  She has not received any of her medication in several days.  She has no medical concerns and has not been having any pain or other symptoms.  She denies any SI, HI or hallucinations on my exam.  Patient's involuntary commitment has been rescinded by the emergency room physician, however family and Adult Protective Services are requesting evaluation for capacity in order to help determine placement for patient.  Per record review, there is concern that patient may be neglected by family.  (Please see notes from licensed clinical social workers).  On evaluation of patient, she is oriented to person and place, however disoriented to time.  Today, patient denies any issues at home with her children.  She denies that she has been mistreated or neglected. She continues to deny any SI, HI, or hallucinations.  She denies past psychiatric history.  Patient does have a documented history of dementia for which she is on donepezil.  A family meeting is held with patient's 3 children Julia Herrera, age 108; Julia Herrera, age 99; and Julia Herrera, age 6).  Family notes that mother has no psychiatric illness.  They do note that she has had increased irritability episodes as her dementia has progressed.  They  note that she "curses all the time."  Patient had daughter, Julia Herrera, living in home with her for the past 10 to 12 years, however Julia Herrera and her mother were no longer getting along, Julia Herrera was becoming verbally aggressive towards Julia Herrera, and the family decided that she should move out.  Julia Herrera has been living independently with supervision by her children.  They have Yard service and maintenance done by a service, family brings meals and checks on her through the day, and Julia Herrera prefers to do her own housekeeping, which they state she has been able to maintain.  The family has decided that patient's 66 year old granddaughter Julia Herrera will be able to move into Julia Herrera's residence so that she has in-home supervision.  They will continue to do visits as they have been in the past.  The family does not wish for their mother to be placed into a nursing home or memory care facility, as they believe that that is not their mother's wishes.   Past Psychiatric History: Cognitive impairment.  Risk to Self:  None Risk to Others:  None Prior Inpatient Therapy:  None Prior Outpatient Therapy:  None  Past Medical History:  Past Medical History:  Diagnosis Date  . Chronic kidney disease   . Dementia ([place])   . Gout   . Hyperlipidemia   . Hypertension   . Osteoporosis     Past Surgical History:  Procedure Laterality Date  . CHOLECYSTECTOMY     Family History:  Family History  Problem Relation  Age of Onset  . Alcohol abuse Mother   . Heart attack Father   . Heart disease Brother    Family Psychiatric  History: None known per patient  Social History:  Social History   Substance and Sexual Activity  Alcohol Use No  . Alcohol/week: 0.0 standard drinks     Social History   Substance and Sexual Activity  Drug Use No    Social History   Socioeconomic History  . Marital status: Single    Spouse name: Not on file  . Number of children: 3  . Years of education: Not on file  . Highest  education level: 7th grade  Occupational History    Employer: Retired  Scientific laboratory technician  . Financial resource strain: Not hard at all  . Food insecurity:    Worry: Never true    Inability: Never true  . Transportation needs:    Medical: No    Non-medical: No  Tobacco Use  . Smoking status: Never Smoker  . Smokeless tobacco: Current User    Types: Snuff  Substance and Sexual Activity  . Alcohol use: No    Alcohol/week: 0.0 standard drinks  . Drug use: No  . Sexual activity: Not Currently    Partners: Male  Lifestyle  . Physical activity:    Days per week: 0 days    Minutes per session: 0 min  . Stress: Not at all  Relationships  . Social connections:    Talks on phone: Patient refused    Gets together: Patient refused    Attends religious service: Patient refused    Active member of club or organization: Patient refused    Attends meetings of clubs or organizations: Patient refused    Relationship status: Patient refused  Other Topics Concern  . Not on file  Social History Narrative  . Not on file   Additional Social History:  Currently living with family, however there is concern for neglect from family.  Adult Protective Services is involved in her care.  Allergies:   Allergies  Allergen Reactions  . Allopurinol Other (See Comments)    dizziness  . Fosamax [Alendronate] Nausea Only  . Niaspan [Niacin Er] Other (See Comments)    unknown  . Zocor [Simvastatin] Other (See Comments)    Muscle pain    Labs:  No results found for this or any previous visit (from the past 48 hour(s)).  No current facility-administered medications for this encounter.    Current Outpatient Medications  Medication Sig Dispense Refill  . allopurinol (ZYLOPRIM) 100 MG tablet Take 100 mg by mouth 2 (two) times daily.     Marland Kitchen aspirin EC 81 MG tablet Take 81 mg by mouth daily.    Marland Kitchen atorvastatin (LIPITOR) 20 MG tablet Take 1 tablet (20 mg total) by mouth at bedtime. 90 tablet 0  .  Cyanocobalamin (B-12) 500 MCG SUBL Place 1 tablet under the tongue daily. 30 tablet 2  . donepezil (ARICEPT) 10 MG tablet Take 1 tablet (10 mg total) by mouth at bedtime. 90 tablet 0  . omeprazole (PRILOSEC) 20 MG capsule Take 1 capsule (20 mg total) by mouth as needed. (Patient taking differently: Take 20 mg by mouth daily. ) 30 capsule 0  . potassium chloride (K-DUR,KLOR-CON) 10 MEQ tablet Take 10 mEq by mouth daily.    . valsartan (DIOVAN) 160 MG tablet Take 0.5 tablets (80 mg total) by mouth daily. To equal 80 mg daily 45 tablet 0  . potassium chloride SA (  K-DUR,KLOR-CON) 20 MEQ tablet Take 1 tablet (20 mEq total) by mouth 2 (two) times daily for 1 day. 2 tablet 0    Musculoskeletal: Strength & Muscle Tone: decreased due to age gait & Station: normal Patient leans: N/A  Psychiatric Specialty Exam: Physical Exam  Nursing note and vitals reviewed. Constitutional: No distress.  Thin  HENT:  Head: Normocephalic and atraumatic.  Eyes: EOM are normal.  Neck: Normal range of motion.  Cardiovascular: Normal rate.  Respiratory: Effort normal.  Musculoskeletal: Normal range of motion.  Neurological: She is alert.  Skin: Skin is warm and dry.    Review of Systems  Unable to perform ROS: Dementia  Patient denies any medical concerns.  Blood pressure (!) 101/51, pulse 65, temperature 98.2 F (36.8 C), temperature source Oral, resp. rate 18, height 5\' 2"  (1.575 m), weight 49.9 kg, SpO2 100 %.Body mass index is 20.12 kg/m.  General Appearance: Fairly Groomed  Eye Contact:  Good  Speech:  Clear and Coherent and Normal Rate  Volume:  Normal  Mood:  Euthymic  Affect:  Appropriate  Thought Process:  Goal Directed  Orientation:  Other:  Person and place  Thought Content:  Rumination  Suicidal Thoughts:  No  Homicidal Thoughts:  No  Memory:  Impaired  Judgement:  Impaired  Insight:  Shallow  Psychomotor Activity:  Normal  Concentration:  Attention Span: Fair  Recall:  Poor  Fund of  Knowledge:  Fair  Language:  Good  Akathisia:  No  Handed:  Right  AIMS (if indicated):   0  Assets:  Social Support Others:  Adult Protective Services involved in patient's case to ensure that she is getting adequate care  ADL's:  Impaired  Cognition:  Impaired,  Moderate  Sleep:   Fluctuates     Treatment Plan Summary: APRYL BRYMER is a 85 y.o. female with known cognitive impairment.  Patient at this time does not have capacity for medical decision making.  Of note, capacity can fluctuate with time.  If there is a concern for patient's mental competency, I encourage family or Adult Protective Services to petition for patient to have a guardian due to the incompetency.  Continue social work involvement with discharge planning with assistance of adult protective services to ensure patient is adequately cared for.  Caseworker for Adult [employer] phone number has been provided Altha Harm Day [phone]).  Plan to discharge patient from emergency room either into care of family or emergency foster placement with adult protective services.  Disposition: No evidence of imminent risk to self or others at present.   Patient does not meet criteria for psychiatric inpatient admission. Supportive therapy provided about ongoing stressors.  Lavella Hammock, MD 04/30/2018 5:23 PM

## 2018-04-30 NOTE — Progress Notes (Signed)
Per Kylie with Adult Protective Services (APS) they are not going to take out a protective order at this time because patient's family is agreeable to placement. Per Kylie APS is working on placement.   [employer], LCSW [phone]

## 2018-04-30 NOTE — ED Provider Notes (Signed)
-----------------------------------------   9:06 AM on 04/30/2018 -----------------------------------------   Blood pressure (!) 101/51, pulse 65, temperature 98.2 F (36.8 C), temperature source Oral, resp. rate 18, height 1.575 m (5\' 2" ), weight 49.9 kg, SpO2 100 %.  The patient is calm and cooperative at this time.  There have been no acute events since the last update.  Awaiting disposition plan from Behavioral Medicine / social work teams.   Hinda Kehr, MD 04/30/18 [phone]

## 2018-04-30 NOTE — ED Notes (Signed)
Patient given meal tray.

## 2018-04-30 NOTE — ED Notes (Signed)
BEHAVIORAL HEALTH ROUNDING Patient sleeping: No. Patient alert and oriented: yes Behavior appropriate: Yes.  ; If no, describe:  Nutrition and fluids offered: yes Toileting and hygiene offered: Yes  Sitter present: q15 minute observations and security monitoring Law enforcement present: Yes    

## 2018-04-30 NOTE — ED Notes (Signed)
Patients daughter called and gave number to Altha Harm Day patients case worker [phone]

## 2018-04-30 NOTE — ED Notes (Signed)
Patient ate 100% breakfast and drank a carton of juice

## 2018-04-30 NOTE — ED Notes (Signed)
Patient visiting with daughter

## 2018-05-01 DIAGNOSIS — R4189 Other symptoms and signs involving cognitive functions and awareness: Secondary | ICD-10-CM

## 2018-05-01 NOTE — Progress Notes (Signed)
   05/01/18 1100  Clinical Encounter Type  Visited With Patient;Health care provider  Visit Type Initial;Spiritual support  Referral From [facility] visited with [name] after recommendation from Dr. Leverne Humbles. Upon arrival to the patient's room with Dr. Leverne Humbles, she was lying down in her bed with the light off. Physician shared news of patient's imminent discharge from the hospital, which was welcomed news. Chaplain and patient engaged in brief conversation about the patient's life (lives at home, upcoming birthday, familial relationships). She said that the staff have been very kind to her here and that she tries to use humour and be kind to everyone she meets.  The patient indicates that she desired prayer for her health, safety, and happiness, which the chaplain provided. Patient expressed gratitude for the visit and prayer.

## 2018-05-01 NOTE — Consult Note (Signed)
[facility] Psychiatry Consult   Reason for Consult: Assessment of decision-making capacity Referring Physician:  Dr. Alfred Herrera Patient Identification: Julia Herrera MRN:  [id number] Principal Diagnosis: Cognitive impairment Diagnosis:  Principal Problem:   Cognitive impairment   Total Time spent with patient: 35 min  Subjective: "I want to go home?"  HPI:   Julia Herrera is a 85 y.o. female patient with a history of dementia, HTN, HL, brought by police after she was IVC by the APS workers for concerns of neglect.  The patient dementia did but able to answer most questions.  She reports that at home, she lives with her daughter who is an alcohol abuser and often brings her friend to abuse alcohol to the home.  She has not received any of her medication in several days.  She has no medical concerns and has not been having any pain or other symptoms.  She denies any SI, HI or hallucinations on my exam.  Patient's involuntary commitment has been rescinded by the emergency room physician, since arrival, however family and Adult Protective Services are requesting evaluation for capacity in order to help determine placement for patient.  Capacity evaluation was completed on April 27, 2018.  At time of that assessment patient did not have capacity for medical decision making.  It is noted that capacity can fluctuate with time, and encouraged for adult protective services or family to reach out for competency for guardianship with the court if that is what was desired.  Patient has been psychiatrically and medically cleared since April 27, 2018.  On April 30, 2018 a family meeting was held with patient's 3 children Julia Herrera, age 105; Julia Herrera, age 31; and Julia Herrera, age 38).  Family notes that mother has no psychiatric illness.  They do note that she has had increased irritability episodes as her dementia has progressed.  They note that she "curses all the time."  Patient had daughter, Julia Herrera,  living in home with her for the past 10 to 12 years, however Julia Herrera and her mother were no longer getting along, [name] was becoming verbally aggressive towards Julia Herrera, and the family decided that she should move out.  [name] has been living independently with supervision by her children.  They have Yard service and maintenance done by a service, family brings meals and checks on her through the day, and [name] prefers to do her own housekeeping, which they state she has been able to maintain.  The family has decided that patient's 44 year old granddaughter Julia Herrera will be able to move into [name]'s residence so that she has in-home supervision.  They will continue to do visits as they have been in the past.  The family does not wish for their mother to be placed into a nursing home or memory care facility, as they believe that that is not their mother's wishes.  On evaluation of patient this morning (05/01/2018), she is oriented to person and place, however disoriented to date, which is her baseline.  Patient continues to express her desire to return home to her family, this is been her desire since arrival to the emergency department.  Patient is able to note that there are issues with her family, but she reports having a good visit with her children yesterday, and believes that they will do what is right in order to take care of her.  At this moment patient does appear to have capacity despite mild dementia.  She is able to describe her decisions  to be home, be a ward of the state and foster placement, or nursing home care.  Patient very clearly is able to discuss the benefits of each, and make a clear decision that she prefers to live at home.  Patient continues to deny suicidal or homicidal ideation, plan or intent.  She denies auditory and visual hallucinations.  Her dementia is at its baseline, and patient is compliant with medications for this diagnosis and other medical conditions.  Call is made  to Julia Herrera with [facility] [phone]).  [name] gives conflicting information, stating she thought that patient had already been discharged to family, but also states that she heard from nursing home that hospital social worker is seeking placement. There is no documentation of an FL-2 being started for patient while in the hospital, per patient record this morning.  There is documentation that in FL-2 was completed by patient's caseworker, Julia Herrera prior to patient's arrival to the hospital.  According to records, patient was placed in the hospital while they were waiting for approval for housing for social work note on 04/27/2018. Caseworker is aware that patient cannot be kept in hospital while placement arrangements are made.  Caseworker gives approval for patient to be discharged to her family.  She ensures that follow-up by Adult Protective Services will take place, in order to ensure that patient is being cared for adequately by family.  Of note, concern for patient's psychiatric change in mental status was due to patient stating there was not food in her home when she called 911.  Of note, DSS caseworker states that there were prepared meals in patient's refrigerator, however patient forgot that there were meals.  DSS was concerned that patient did not have enough supervision in her home.  After family meeting, family plans for daughter Julia Herrera to stay with [name] during the day, and granddaughter they to sleep at [name]'s house at night.  Today, patient denies any issues at home with her children.  She denies that she has been mistreated or neglected. She continues to deny any SI, HI, or hallucinations.  She denies past psychiatric history.  Patient does have a documented history of dementia for which she is on donepezil.  She is compliant with medications.  She has had no behavioral issues.  Calls made to family, who are happy to pick up patient  today.   Past Psychiatric History: Cognitive impairment.  Risk to Self:  None Risk to Others:  None Prior Inpatient Therapy:  None Prior Outpatient Therapy:  None  Past Medical History:  Past Medical History:  Diagnosis Date  . Chronic kidney disease   . Dementia ([place])   . Gout   . Hyperlipidemia   . Hypertension   . Osteoporosis     Past Surgical History:  Procedure Laterality Date  . CHOLECYSTECTOMY     Family History:  Family History  Problem Relation Age of Onset  . Alcohol abuse Mother   . Heart attack Father   . Heart disease Brother    Family Psychiatric  History: None known per patient  Social History:  Social History   Substance and Sexual Activity  Alcohol Use No  . Alcohol/week: 0.0 standard drinks     Social History   Substance and Sexual Activity  Drug Use No    Social History   Socioeconomic History  . Marital status: Single    Spouse name: Not on file  . Number of children: 3  .  Years of education: Not on file  . Highest education level: 7th grade  Occupational History    Employer: Retired  Scientific laboratory technician  . Financial resource strain: Not hard at all  . Food insecurity:    Worry: Never true    Inability: Never true  . Transportation needs:    Medical: No    Non-medical: No  Tobacco Use  . Smoking status: Never Smoker  . Smokeless tobacco: Current User    Types: Snuff  Substance and Sexual Activity  . Alcohol use: No    Alcohol/week: 0.0 standard drinks  . Drug use: No  . Sexual activity: Not Currently    Partners: Male  Lifestyle  . Physical activity:    Days per week: 0 days    Minutes per session: 0 min  . Stress: Not at all  Relationships  . Social connections:    Talks on phone: Patient refused    Gets together: Patient refused    Attends religious service: Patient refused    Active member of club or organization: Patient refused    Attends meetings of clubs or organizations: Patient refused    Relationship status:  Patient refused  Other Topics Concern  . Not on file  Social History Narrative  . Not on file   Additional Social History:  Currently living with family, however there is concern for neglect from family.  Adult Protective Services is involved in her care.  Allergies:   Allergies  Allergen Reactions  . Allopurinol Other (See Comments)    dizziness  . Fosamax [Alendronate] Nausea Only  . Niaspan [Niacin Er] Other (See Comments)    unknown  . Zocor [Simvastatin] Other (See Comments)    Muscle pain    Labs:  No results found for this or any previous visit (from the past 48 hour(s)).  No current facility-administered medications for this encounter.    Current Outpatient Medications  Medication Sig Dispense Refill  . allopurinol (ZYLOPRIM) 100 MG tablet Take 100 mg by mouth 2 (two) times daily.     Marland Kitchen aspirin EC 81 MG tablet Take 81 mg by mouth daily.    Marland Kitchen atorvastatin (LIPITOR) 20 MG tablet Take 1 tablet (20 mg total) by mouth at bedtime. 90 tablet 0  . Cyanocobalamin (B-12) 500 MCG SUBL Place 1 tablet under the tongue daily. 30 tablet 2  . donepezil (ARICEPT) 10 MG tablet Take 1 tablet (10 mg total) by mouth at bedtime. 90 tablet 0  . omeprazole (PRILOSEC) 20 MG capsule Take 1 capsule (20 mg total) by mouth as needed. (Patient taking differently: Take 20 mg by mouth daily. ) 30 capsule 0  . potassium chloride (K-DUR,KLOR-CON) 10 MEQ tablet Take 10 mEq by mouth daily.    . valsartan (DIOVAN) 160 MG tablet Take 0.5 tablets (80 mg total) by mouth daily. To equal 80 mg daily 45 tablet 0  . potassium chloride SA (K-DUR,KLOR-CON) 20 MEQ tablet Take 1 tablet (20 mEq total) by mouth 2 (two) times daily for 1 day. 2 tablet 0    Musculoskeletal: Strength & Muscle Tone: decreased due to age gait & Station: normal Patient leans: N/A  Psychiatric Specialty Exam: Physical Exam  Nursing note and vitals reviewed. Constitutional: No distress.  Thin  HENT:  Head: Normocephalic and  atraumatic.  Eyes: EOM are normal.  Neck: Normal range of motion.  Cardiovascular: Normal rate.  Respiratory: Effort normal.  Genitourinary:    Uterus normal.   Musculoskeletal: Normal range of motion.  Neurological: She is alert.  Skin: Skin is warm and dry.    Review of Systems  Unable to perform ROS: Dementia  Patient denies any medical concerns.  Blood pressure (!) 103/53, pulse 78, temperature 98 F (36.7 C), temperature source Oral, resp. rate 16, height 5\' 2"  (1.575 m), weight 49.9 kg, SpO2 100 %.Body mass index is 20.12 kg/m.  General Appearance: Fairly Groomed  Eye Contact:  Good  Speech:  Clear and Coherent and Normal Rate  Volume:  Normal  Mood:  Euthymic  Affect:  Appropriate  Thought Process:  Goal Directed  Orientation:  Other:  Person and place  Thought Content:  Hallucinations: None  Suicidal Thoughts:  No  Homicidal Thoughts:  No  Memory:  Impaired  Judgement:  Impaired  Insight:  Shallow  Psychomotor Activity:  Normal  Concentration:  Attention Span: Fair  Recall:  Poor  Fund of Knowledge:  Fair  Language:  Good  Akathisia:  No  Handed:  Right  AIMS (if indicated):   0  Assets:  Social Support Others:  Adult Protective Services involved in patient's case to ensure that she is getting adequate care  ADL's:  Impaired  Cognition:  Impaired,  Moderate  Sleep:   Patient has been sleeping well while in the hospital.  She has not had any nighttime wandering.     Treatment Plan Summary: TARALYNN QUIETT is a 85 y.o. female with known cognitive impairment.  Patient at this time does have capacity for medical decision making.  Of note, capacity can fluctuate with time.  If there is a concern for patient's mental competency, I encourage family or Adult Protective Services to petition for patient to have a guardian due to the incompetency.  Continue social work involvement with discharge planning with assistance of adult protective services to ensure patient is  adequately cared for.  Caseworker for Adult [employer] phone number has been provided Altha Harm Day [phone]), and patient may be released into the care of her family today (see notes above.  Disposition: No evidence of imminent risk to self or others at present.   Patient does not meet criteria for psychiatric inpatient admission. Supportive therapy provided about ongoing stressors.   Patient may be discharged into the care of her family, with DSS supervision at home.  Lavella Hammock, MD 05/01/2018 10:10 AM

## 2018-05-01 NOTE — ED Notes (Signed)
Hourly rounding reveals patient sleeping in room. No complaints, stable, in no acute distress. Q15 minute rounds and monitoring via Security Cameras to continue. 

## 2018-05-01 NOTE — ED Notes (Signed)
Patient to be D/C'd home with Family

## 2018-05-01 NOTE — ED Provider Notes (Signed)
Patient seen by psychiatry.  There is no problems going on.  Is felt to be due to her mild cognitive impairment.  We will let her go home.   Nena Polio, MD 05/01/18 [id number]

## 2018-05-01 NOTE — ED Provider Notes (Signed)
Clinical social work----------------------------------------- 6:27 AM on 05/01/2018 -----------------------------------------   Blood pressure (!) 103/53, pulse 78, temperature 98 F (36.7 C), temperature source Oral, resp. rate 16, height 5\' 2"  (1.575 m), weight 49.9 kg, SpO2 100 %.  The patient is calm and cooperative at this time.  There have been no acute events since the last update.  Awaiting disposition plan from social work team.   Paulette Blanch, MD 05/01/18 [phone]

## 2018-05-01 NOTE — ED Notes (Signed)
Gave food tray with juice. 

## 2018-05-01 NOTE — Discharge Instructions (Signed)
Please return home.  Follow-up with your regular doctor.  Continue all your medicines.

## 2018-05-01 NOTE — ED Notes (Signed)
E-signature pad not working.  Pt denies SI, HI, and A/V hallucinations.  Contracts for safety.  Pleasant and cooperative.

## 2018-05-05 ENCOUNTER — Other Ambulatory Visit: Payer: Self-pay | Admitting: *Deleted
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
## 2018-05-09 ENCOUNTER — Other Ambulatory Visit: Payer: Self-pay | Admitting: Family Medicine

## 2018-05-09 NOTE — Telephone Encounter (Signed)
Refill request for Hypertension medication:  Valsartan 160 mg  Last office visit pertaining to hypertension: 03/10/2018  BP Readings from Last 3 Encounters:  05/01/18 (!) 137/99  03/10/18 130/90  02/17/18 133/61   Lab Results  Component Value Date   CREATININE 1.54 (H) 04/26/2018   BUN 14 04/26/2018   NA 140 04/26/2018   K 3.5 04/26/2018   CL 109 04/26/2018   CO2 24 04/26/2018   Follow-ups on file. 05/11/2018

## 2018-05-11 ENCOUNTER — Ambulatory Visit (INDEPENDENT_AMBULATORY_CARE_PROVIDER_SITE_OTHER): Payer: Medicare Other | Admitting: Family Medicine

## 2018-05-11 ENCOUNTER — Encounter: Payer: Self-pay | Admitting: Family Medicine

## 2018-05-11 VITALS — BP 110/80 | HR 80 | Temp 97.6°F | Resp 20 | Ht 60.0 in | Wt 129.5 lb

## 2018-05-11 DIAGNOSIS — T7401XA Adult neglect or abandonment, confirmed, initial encounter: Secondary | ICD-10-CM | POA: Insufficient documentation

## 2018-05-11 DIAGNOSIS — I10 Essential (primary) hypertension: Secondary | ICD-10-CM | POA: Diagnosis not present

## 2018-05-11 DIAGNOSIS — N184 Chronic kidney disease, stage 4 (severe): Secondary | ICD-10-CM

## 2018-05-11 DIAGNOSIS — E7800 Pure hypercholesterolemia, unspecified: Secondary | ICD-10-CM | POA: Diagnosis not present

## 2018-05-11 DIAGNOSIS — F09 Unspecified mental disorder due to known physiological condition: Secondary | ICD-10-CM

## 2018-05-11 DIAGNOSIS — T7401XD Adult neglect or abandonment, confirmed, subsequent encounter: Secondary | ICD-10-CM

## 2018-05-11 MED ORDER — VALSARTAN 80 MG PO TABS: 80.0000 mg | ORAL_TABLET | Freq: Every day | ORAL | 0 refills | Status: DC
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
## 2018-06-07 ENCOUNTER — Other Ambulatory Visit: Payer: Self-pay

## 2018-06-07 NOTE — Patient Outreach (Signed)
[facility]) Care Management  06/07/2018  Julia Herrera 11/19/1925 [id number]   Medication Adherence call to Julia Herrera spoke with patient she is due on Valsartan 160  She ask if we can call the pharmacy for more refill and if we can ask for a 90 days supply. [facility] said they can not provide patient with a 90 days supply at this time because of the emergency that is happening (covid-19) but they will fill the prescription for a 30 days supply patient is showing past due under [place].    [facility] Direct Dial [phone]  Fax [phone] [email or web address]

## 2018-06-14 ENCOUNTER — Ambulatory Visit: Payer: Self-pay | Admitting: Family Medicine

## 2018-06-15 ENCOUNTER — Telehealth: Payer: Self-pay | Admitting: Family Medicine

## 2018-06-15 DIAGNOSIS — E7800 Pure hypercholesterolemia, unspecified: Secondary | ICD-10-CM

## 2018-06-15 NOTE — Telephone Encounter (Signed)
Erick Alley (Education officer, museum) from [facility] [employer] left a vm stating pt is needing new rx under doctors orders before [facility] will fill them again.   Those rxs are: Allopurinol, ASA 81, Atorvastatin, B-12, Donepezil, Omeprazole, Potassium Chloride  Her call back number is: [phone]

## 2018-06-16 NOTE — Telephone Encounter (Signed)
Refill request for general medication: Alloprurinol, ASA, B-12, Donepezil, Omeprazole, Potassium  Last office visit: 05/11/2018  Follow-ups on file. 06/21/2018

## 2018-06-16 NOTE — Telephone Encounter (Signed)
Please call and schedule appointment for patient: Virtual

## 2018-06-19 NOTE — Telephone Encounter (Signed)
Pt is scheduled for 06/21/2018

## 2018-06-21 ENCOUNTER — Encounter: Payer: Self-pay | Admitting: Family Medicine

## 2018-06-21 ENCOUNTER — Ambulatory Visit (INDEPENDENT_AMBULATORY_CARE_PROVIDER_SITE_OTHER): Payer: Medicare Other | Admitting: Family Medicine

## 2018-06-21 ENCOUNTER — Other Ambulatory Visit: Payer: Self-pay

## 2018-06-21 DIAGNOSIS — F09 Unspecified mental disorder due to known physiological condition: Secondary | ICD-10-CM

## 2018-06-21 DIAGNOSIS — N184 Chronic kidney disease, stage 4 (severe): Secondary | ICD-10-CM

## 2018-06-21 DIAGNOSIS — E7800 Pure hypercholesterolemia, unspecified: Secondary | ICD-10-CM

## 2018-06-21 MED ORDER — OMEPRAZOLE 20 MG PO CPDR: 20.0000 mg | DELAYED_RELEASE_CAPSULE | Freq: Every day | ORAL | 0 refills | Status: DC

## 2018-06-21 MED ORDER — ALLOPURINOL 100 MG PO TABS: 100.0000 mg | ORAL_TABLET | Freq: Two times a day (BID) | ORAL | 0 refills | Status: AC

## 2018-06-21 MED ORDER — ATORVASTATIN CALCIUM 20 MG PO TABS: 20.0000 mg | ORAL_TABLET | Freq: Every day | ORAL | 0 refills | Status: AC

## 2018-06-21 MED ORDER — VALSARTAN 80 MG PO TABS: 80.0000 mg | ORAL_TABLET | Freq: Every day | ORAL | 0 refills | Status: DC
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
## 2018-08-08 ENCOUNTER — Emergency Department
Admission: EM | Admit: 2018-08-08 | Discharge: 2018-08-11 | Disposition: A | Discharge status: Other Acute Inpatient Hospital | Payer: Medicare Other | Attending: Emergency Medicine | Admitting: Emergency Medicine

## 2018-08-08 ENCOUNTER — Other Ambulatory Visit: Payer: Self-pay

## 2018-08-08 ENCOUNTER — Emergency Department: Payer: Medicare Other

## 2018-08-08 DIAGNOSIS — N184 Chronic kidney disease, stage 4 (severe): Secondary | ICD-10-CM | POA: Diagnosis not present

## 2018-08-08 DIAGNOSIS — F99 Mental disorder, not otherwise specified: Secondary | ICD-10-CM | POA: Diagnosis present

## 2018-08-08 DIAGNOSIS — F0390 Unspecified dementia without behavioral disturbance: Secondary | ICD-10-CM | POA: Diagnosis present

## 2018-08-08 DIAGNOSIS — I129 Hypertensive chronic kidney disease with stage 1 through stage 4 chronic kidney disease, or unspecified chronic kidney disease: Secondary | ICD-10-CM | POA: Diagnosis not present

## 2018-08-08 DIAGNOSIS — Z03818 Encounter for observation for suspected exposure to other biological agents ruled out: Secondary | ICD-10-CM | POA: Diagnosis not present

## 2018-08-08 DIAGNOSIS — R258 Other abnormal involuntary movements: Secondary | ICD-10-CM | POA: Diagnosis not present

## 2018-08-08 DIAGNOSIS — F22 Delusional disorders: Secondary | ICD-10-CM | POA: Diagnosis not present

## 2018-08-08 DIAGNOSIS — Z20828 Contact with and (suspected) exposure to other viral communicable diseases: Secondary | ICD-10-CM | POA: Diagnosis not present

## 2018-08-08 DIAGNOSIS — Z9049 Acquired absence of other specified parts of digestive tract: Secondary | ICD-10-CM | POA: Insufficient documentation

## 2018-08-08 DIAGNOSIS — K449 Diaphragmatic hernia without obstruction or gangrene: Secondary | ICD-10-CM | POA: Diagnosis not present

## 2018-08-08 DIAGNOSIS — N200 Calculus of kidney: Secondary | ICD-10-CM | POA: Diagnosis not present

## 2018-08-08 DIAGNOSIS — Z79899 Other long term (current) drug therapy: Secondary | ICD-10-CM | POA: Insufficient documentation

## 2018-08-08 DIAGNOSIS — Z046 Encounter for general psychiatric examination, requested by authority: Secondary | ICD-10-CM

## 2018-08-08 DIAGNOSIS — R531 Weakness: Secondary | ICD-10-CM | POA: Diagnosis not present

## 2018-08-08 DIAGNOSIS — R451 Restlessness and agitation: Secondary | ICD-10-CM

## 2018-08-08 DIAGNOSIS — F0391 Unspecified dementia with behavioral disturbance: Secondary | ICD-10-CM | POA: Diagnosis not present

## 2018-08-08 LAB — COMPREHENSIVE METABOLIC PANEL
ALT: 8 U/L (ref 0–44)
AST: 16 U/L (ref 15–41)
Albumin: 4.4 g/dL (ref 3.5–5.0)
Alkaline Phosphatase: 61 U/L (ref 38–126)
Anion gap: 10 (ref 5–15)
BUN: 19 mg/dL (ref 8–23)
CO2: 20 mmol/L — ABNORMAL LOW (ref 22–32)
Calcium: 9.3 mg/dL (ref 8.9–10.3)
Chloride: 109 mmol/L (ref 98–111)
Creatinine, Ser: 1.74 mg/dL — ABNORMAL HIGH (ref 0.44–1.00)
GFR calc Af Amer: 29 mL/min — ABNORMAL LOW (ref >60)
GFR calc non Af Amer: 25 mL/min — ABNORMAL LOW (ref >60)
Glucose, Bld: 120 mg/dL — ABNORMAL HIGH (ref 70–99)
Potassium: 3.7 mmol/L (ref 3.5–5.1)
Sodium: 139 mmol/L (ref 135–145)
Total Bilirubin: 1.1 mg/dL (ref 0.3–1.2)
Total Protein: 7.4 g/dL (ref 6.5–8.1)

## 2018-08-08 LAB — CBC WITH DIFFERENTIAL/PLATELET
Abs Immature Granulocytes: 0.01 10*3/uL (ref 0.00–0.07)
Basophils Absolute: 0.1 10*3/uL (ref 0.0–0.1)
Basophils Relative: 1 %
Eosinophils Absolute: 0.4 10*3/uL (ref 0.0–0.5)
Eosinophils Relative: 9 %
HCT: 35.3 % — ABNORMAL LOW (ref 36.0–46.0)
Hemoglobin: 10.6 g/dL — ABNORMAL LOW (ref 12.0–15.0)
Immature Granulocytes: 0 %
Lymphocytes Relative: 22 %
Lymphs Abs: 1 10*3/uL (ref 0.7–4.0)
MCH: 21.3 pg — ABNORMAL LOW (ref 26.0–34.0)
MCHC: 30 g/dL (ref 30.0–36.0)
MCV: 71 fL — ABNORMAL LOW (ref 80.0–100.0)
Monocytes Absolute: 0.4 10*3/uL (ref 0.1–1.0)
Monocytes Relative: 10 %
Neutro Abs: 2.6 10*3/uL (ref 1.7–7.7)
Neutrophils Relative %: 58 %
Platelets: 412 10*3/uL — ABNORMAL HIGH (ref 150–400)
RBC: 4.97 MIL/uL (ref 3.87–5.11)
RDW: 20.9 % — ABNORMAL HIGH (ref 11.5–15.5)
WBC: 4.5 10*3/uL (ref 4.0–10.5)
nRBC: 0 % (ref 0.0–0.2)

## 2018-08-08 LAB — SARS CORONAVIRUS 2 BY RT PCR (HOSPITAL ORDER, PERFORMED IN ~~LOC~~ HOSPITAL LAB): SARS Coronavirus 2: NEGATIVE

## 2018-08-08 LAB — GLUCOSE, CAPILLARY: Glucose-Capillary: 102 mg/dL — ABNORMAL HIGH (ref 70–99)

## 2018-08-08 LAB — TROPONIN I: Troponin I: <0.03 ng/mL (ref <0.03)

## 2018-08-08 LAB — ETHANOL: Alcohol, Ethyl (B): <10 mg/dL (ref <10)

## 2018-08-08 MED ORDER — LORAZEPAM 2 MG/ML IJ SOLN
1.0000 mg | Freq: Once | INTRAMUSCULAR | Status: AC
  Administered 2018-08-08: 21:00:00 1 mg via INTRAMUSCULAR
  Filled 2018-08-08: qty 1

## 2018-08-08 MED ORDER — LORAZEPAM 1 MG PO TABS
1.0000 mg | ORAL_TABLET | Freq: Once | ORAL | Status: DC
  Filled 2018-08-08: qty 1

## 2018-08-08 MED ORDER — HALOPERIDOL LACTATE 5 MG/ML IJ SOLN
5.0000 mg | Freq: Once | INTRAMUSCULAR | Status: AC
  Administered 2018-08-08: 5 mg via INTRAMUSCULAR
  Filled 2018-08-08: qty 1
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
## 2018-08-08 NOTE — ED Notes (Signed)
Pt had to be physically re-directed into her room. IM injections given as ordered by EDP.

## 2018-08-08 NOTE — ED Notes (Signed)
Pt in doorway speaking with police officer. Pt continuously asking when she will be able to leave.  No agitation noted. 15 minute checks in  place.

## 2018-08-08 NOTE — ED Notes (Signed)
Pt dressed out by this Probation officer and Berton Lan, Therapist, sports.   Multi colored pants Yellow shirt Black slippers Gray socks White bra Black wallet 2 yellowed colored rings Yellow colored watch 2 earrings *All jewelery placed in wallet by patient*
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
## 2018-08-09 DIAGNOSIS — F0390 Unspecified dementia without behavioral disturbance: Secondary | ICD-10-CM | POA: Diagnosis present

## 2018-08-09 DIAGNOSIS — F0391 Unspecified dementia with behavioral disturbance: Secondary | ICD-10-CM

## 2018-08-09 DIAGNOSIS — F22 Delusional disorders: Secondary | ICD-10-CM | POA: Diagnosis not present

## 2018-08-09 DIAGNOSIS — R451 Restlessness and agitation: Secondary | ICD-10-CM | POA: Diagnosis not present

## 2018-08-09 MED ORDER — PANTOPRAZOLE SODIUM 40 MG PO TBEC
40.0000 mg | DELAYED_RELEASE_TABLET | Freq: Every day | ORAL | Status: DC
  Administered 2018-08-10: 40 mg via ORAL
  Filled 2018-08-09: qty 1

## 2018-08-09 MED ORDER — ALLOPURINOL 100 MG PO TABS
100.0000 mg | ORAL_TABLET | Freq: Two times a day (BID) | ORAL | Status: DC
  Administered 2018-08-09 – 2018-08-10 (×3): 100 mg via ORAL
  Filled 2018-08-09 (×6): qty 1

## 2018-08-09 MED ORDER — OLANZAPINE 5 MG PO TBDP
2.5000 mg | ORAL_TABLET | Freq: Every day | ORAL | Status: DC
  Administered 2018-08-09 – 2018-08-10 (×2): 2.5 mg via ORAL
  Filled 2018-08-09 (×2): qty 1

## 2018-08-09 MED ORDER — CYANOCOBALAMIN 500 MCG PO TABS
500.0000 ug | ORAL_TABLET | Freq: Every day | ORAL | Status: DC
  Administered 2018-08-09 – 2018-08-10 (×2): 500 ug via ORAL
  Filled 2018-08-09 (×3): qty 1

## 2018-08-09 MED ORDER — POTASSIUM CHLORIDE CRYS ER 20 MEQ PO TBCR
10.0000 meq | EXTENDED_RELEASE_TABLET | Freq: Every day | ORAL | Status: DC
  Administered 2018-08-10: 10 meq via ORAL
  Filled 2018-08-09: qty 1

## 2018-08-09 MED ORDER — ATORVASTATIN CALCIUM 20 MG PO TABS
20.0000 mg | ORAL_TABLET | Freq: Every day | ORAL | Status: DC
  Administered 2018-08-09 – 2018-08-10 (×2): 20 mg via ORAL
  Filled 2018-08-09 (×2): qty 1

## 2018-08-09 MED ORDER — IRBESARTAN 75 MG PO TABS
37.5000 mg | ORAL_TABLET | Freq: Every day | ORAL | Status: DC
  Administered 2018-08-10: 37.5 mg via ORAL
  Filled 2018-08-09 (×2): qty 0.5

## 2018-08-09 MED ORDER — ASPIRIN EC 81 MG PO TBEC
81.0000 mg | DELAYED_RELEASE_TABLET | Freq: Every day | ORAL | Status: DC
  Administered 2018-08-10: 81 mg via ORAL
  Filled 2018-08-09: qty 1
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
## 2018-08-09 NOTE — ED Notes (Signed)
Pt. Eating in room.  Pt. Is calm and cooperative at this time.

## 2018-08-09 NOTE — ED Notes (Signed)
Patient ambulated to bathroom with minimal assistance.  

## 2018-08-09 NOTE — ED Notes (Signed)
Hourly rounding reveals patient sleeping in room. No complaints, stable, in no acute distress. Q15 minute rounds and monitoring via Security to continue. 

## 2018-08-09 NOTE — ED Notes (Signed)
Spoke to patients son Meredyth Hornung [phone]) he dropped off her medication list and to pick up her belongings. Explained to Mr. Jolley that because of HIPAA and patients rights, that we will keep patients belongings locked up here in a secure location and keep them until she is transferred to another facility, so that she can have clothing. Mr. Rothbauer said ok he wanted to take her belongings because she has cash in her wallet and he wanted to fix her tv and fix up her house while she was gone.   Patient medlist on her chart

## 2018-08-09 NOTE — ED Notes (Signed)
Offered patient her dinner, which consisted of [place], mashed potatoes and green beans, patient asked that's what we having for breakfast. Writer informed her it was dinner time. Patient said oh ok im not hungry right now

## 2018-08-09 NOTE — BH Assessment (Signed)
Referral information for Psychiatric Hospitalization faxed to;   [facility] [phone]),   . [facility] [phone] -or- [phone]),  . [facility] [phone])  . [facility] ([phone]),  . [facility] [phone] or [phone])  . [facility] [phone] or [phone]),   . [facility] [phone])  . [facility] [phone]).

## 2018-08-09 NOTE — Clinical Social Work Note (Signed)
CSW received phone call from [facility] worker Juluis Pitch, [phone] asking for the whereabouts of patient.  CSW informed her that patient is currently in the ED, and psych is recommending Geripsych placement.  CSW provided APS worker the [facility] social worker phone number.  Evette Cristal, MSW, LCSW 08/09/2018 9:04 AM

## 2018-08-09 NOTE — ED Notes (Signed)
Pt given food tray and drink. No other concerns at this time.

## 2018-08-09 NOTE — ED Notes (Signed)
changed patient's diaper and replaced pants patient refused shower/or bath at this time will ask again

## 2018-08-09 NOTE — ED Notes (Signed)
Pt given meal tray.

## 2018-08-10 DIAGNOSIS — F22 Delusional disorders: Secondary | ICD-10-CM | POA: Diagnosis not present

## 2018-08-10 DIAGNOSIS — F0391 Unspecified dementia with behavioral disturbance: Secondary | ICD-10-CM | POA: Diagnosis not present

## 2018-08-10 DIAGNOSIS — R451 Restlessness and agitation: Secondary | ICD-10-CM | POA: Diagnosis not present

## 2018-08-10 MED ORDER — ONDANSETRON HCL 4 MG/2ML IJ SOLN
4.0000 mg | Freq: Once | INTRAMUSCULAR | Status: AC
  Administered 2018-08-10: 4 mg via INTRAVENOUS
  Filled 2018-08-10: qty 2

## 2018-08-10 MED ORDER — ONDANSETRON HCL 4 MG/2ML IJ SOLN: 4.0000 mg | Freq: Once | INTRAMUSCULAR | Status: DC

## 2018-08-10 MED ORDER — SODIUM CHLORIDE 0.9 % IV SOLN
1000.0000 mL | Freq: Once | INTRAVENOUS | Status: AC
  Administered 2018-08-10: 1000 mL via INTRAVENOUS

## 2018-08-10 NOTE — BH Assessment (Addendum)
Writer followed up with Psychiatric Hospitalization referrals;    [facility] ([phone]), denied due to [facility] ([phone]- [phone]), declined due to medical acuity.   [facility] ([phone]), denied due to dementia.    [facility] ([phone]), Do not have any Geratic beds   [facility] [phone] or [phone]) unable to reach someone.   [facility] ([phone] or [phone]),    [facility] [phone])   [facility] ([phone]), Declined, they only have beds for someone who can handled a roommate.

## 2018-08-10 NOTE — ED Notes (Signed)
Pt incontinent of urine, brief changed per pt request.

## 2018-08-10 NOTE — ED Notes (Signed)
Patient with one episode of vomiting. Denies abdominal pain. Denies any further nausea at this time.

## 2018-08-10 NOTE — ED Provider Notes (Signed)
-----------------------------------------   3:22 AM on 08/10/2018 -----------------------------------------   Blood pressure 137/90, pulse (!) 115, temperature 98.1 F (36.7 C), temperature source Oral, resp. rate 18, height 1.575 m (5\' 2" ), weight 53.5 kg, SpO2 100 %.  The patient is calm and cooperative at this time.  There have been no acute events since the last update.  Awaiting disposition plan from Behavioral Medicine team and/or social work team.   Hinda Kehr, MD 08/10/18 [phone]

## 2018-08-10 NOTE — BH Assessment (Signed)
Patient has been accepted to [facility].  Patient assigned to Geriatric Unit  Accepting physician is Dr. Troy Sine.  Call report to [phone].  Representative was [employer].   ER Staff is aware of it:  Vaughan Basta, ER Secretary  Dr. Alm Bustard, ER MD  Mickel Baas, Patient's Nurse     Patient's son [phone]) have been updated as well.   Address:  [address] [place],  [place], [place] [address]  Requesting patient be arrived tomorrow (08/11/2018) by 7pm

## 2018-08-10 NOTE — ED Notes (Signed)
Pt given meal tray.

## 2018-08-10 NOTE — ED Notes (Signed)
Patient placed on cardiac monitor.
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
## 2018-08-11 ENCOUNTER — Emergency Department: Payer: Medicare Other

## 2018-08-11 DIAGNOSIS — N200 Calculus of kidney: Secondary | ICD-10-CM | POA: Diagnosis not present

## 2018-08-11 LAB — URINE DRUG SCREEN, QUALITATIVE (ARMC ONLY)
Amphetamines, Ur Screen: NOT DETECTED
Barbiturates, Ur Screen: NOT DETECTED
Benzodiazepine, Ur Scrn: NOT DETECTED
Cannabinoid 50 Ng, Ur ~~LOC~~: NOT DETECTED
Cocaine Metabolite,Ur ~~LOC~~: NOT DETECTED
MDMA (Ecstasy)Ur Screen: NOT DETECTED
Methadone Scn, Ur: NOT DETECTED
Opiate, Ur Screen: NOT DETECTED
Phencyclidine (PCP) Ur S: NOT DETECTED
Tricyclic, Ur Screen: NOT DETECTED

## 2018-08-11 LAB — CBC WITH DIFFERENTIAL/PLATELET
Abs Immature Granulocytes: 0.02 10*3/uL (ref 0.00–0.07)
Basophils Absolute: 0 10*3/uL (ref 0.0–0.1)
Basophils Relative: 1 %
Eosinophils Absolute: 0.9 10*3/uL — ABNORMAL HIGH (ref 0.0–0.5)
Eosinophils Relative: 14 %
HCT: 35.4 % — ABNORMAL LOW (ref 36.0–46.0)
Hemoglobin: 10.5 g/dL — ABNORMAL LOW (ref 12.0–15.0)
Immature Granulocytes: 0 %
Lymphocytes Relative: 15 %
Lymphs Abs: 1 10*3/uL (ref 0.7–4.0)
MCH: 21.3 pg — ABNORMAL LOW (ref 26.0–34.0)
MCHC: 29.7 g/dL — ABNORMAL LOW (ref 30.0–36.0)
MCV: 71.8 fL — ABNORMAL LOW (ref 80.0–100.0)
Monocytes Absolute: 0.6 10*3/uL (ref 0.1–1.0)
Monocytes Relative: 9 %
Neutro Abs: 3.8 10*3/uL (ref 1.7–7.7)
Neutrophils Relative %: 61 %
Platelets: 352 10*3/uL (ref 150–400)
RBC: 4.93 MIL/uL (ref 3.87–5.11)
RDW: 20.9 % — ABNORMAL HIGH (ref 11.5–15.5)
WBC: 6.3 10*3/uL (ref 4.0–10.5)
nRBC: 0 % (ref 0.0–0.2)

## 2018-08-11 LAB — COMPREHENSIVE METABOLIC PANEL
ALT: 11 U/L (ref 0–44)
AST: 25 U/L (ref 15–41)
Albumin: 3.7 g/dL (ref 3.5–5.0)
Alkaline Phosphatase: 59 U/L (ref 38–126)
Anion gap: 9 (ref 5–15)
BUN: 12 mg/dL (ref 8–23)
CO2: 24 mmol/L (ref 22–32)
Calcium: 8.9 mg/dL (ref 8.9–10.3)
Chloride: 108 mmol/L (ref 98–111)
Creatinine, Ser: 1.38 mg/dL — ABNORMAL HIGH (ref 0.44–1.00)
GFR calc Af Amer: 39 mL/min — ABNORMAL LOW (ref >60)
GFR calc non Af Amer: 34 mL/min — ABNORMAL LOW (ref >60)
Glucose, Bld: 105 mg/dL — ABNORMAL HIGH (ref 70–99)
Potassium: 3.8 mmol/L (ref 3.5–5.1)
Sodium: 141 mmol/L (ref 135–145)
Total Bilirubin: 0.8 mg/dL (ref 0.3–1.2)
Total Protein: 6.7 g/dL (ref 6.5–8.1)

## 2018-08-11 LAB — URINALYSIS, COMPLETE (UACMP) WITH MICROSCOPIC
Bacteria, UA: NONE SEEN
Bilirubin Urine: NEGATIVE
Glucose, UA: NEGATIVE mg/dL
Hgb urine dipstick: NEGATIVE
Ketones, ur: NEGATIVE mg/dL
Nitrite: NEGATIVE
Protein, ur: NEGATIVE mg/dL
Specific Gravity, Urine: 1.013 (ref 1.005–1.030)
pH: 8 (ref 5.0–8.0)

## 2018-08-11 LAB — LIPASE, BLOOD: Lipase: 51 U/L (ref 11–51)

## 2018-08-11 LAB — TROPONIN I: Troponin I: <0.03 ng/mL (ref <0.03)

## 2018-08-11 LAB — LACTIC ACID, PLASMA: Lactic Acid, Venous: 1.9 mmol/L (ref 0.5–1.9)

## 2018-08-11 MED ORDER — IOHEXOL 240 MG/ML SOLN
25.0000 mL | INTRAMUSCULAR | Status: AC
  Administered 2018-08-11: 03:00:00 25 mL via ORAL

## 2018-08-11 NOTE — ED Notes (Signed)
Pt up to bathroom to obtain urine specimen. Pt ambulatory with no difficulty.

## 2018-08-11 NOTE — ED Notes (Signed)
Pt to CT via wheelchair with [place] [employer] and [employer] officer Vertell Limber.

## 2018-08-11 NOTE — ED Notes (Addendum)
Pt with one episode of emesis. Pt ambulatory to bathroom at this time without the need for assistance. Pt denies any c/o abdominal pain or continuing nausea. As this is the pt's 2nd time in the last 4 hrs of an episode of emesis, pt's HOB raised to 45 degrees at this time in an attempt to prevent any further episodes of vomiting that might be related to acid reflux. Dr Beather Arbour made aware.

## 2018-08-11 NOTE — ED Notes (Signed)
Pt returned from CT. Moved to Rm 22 for monitoring.

## 2018-08-11 NOTE — ED Notes (Signed)
Oral contrast completed. CT tech informed.

## 2018-08-11 NOTE — ED Notes (Signed)
Patient handed her son Julia Herrera her billfold walllet, she also handed him $62.00 in cash and she also had loose change in the wallet. Patient also gave her son her check book. Patient took out a couple pf gold colored rings and a gold colored watch

## 2018-08-11 NOTE — ED Notes (Signed)
Hourly rounding reveals patient sleeping in room. No complaints, stable, in no acute distress. Q15 minute rounds and monitoring via Security to continue. 

## 2018-08-11 NOTE — ED Notes (Signed)
EMTALA reviewed. 

## 2018-08-11 NOTE — ED Notes (Signed)
Patient transferred to [facility] with [facility] Dept., patient and sheriff Owens Shark received transfer papers. Patient received belongings and verbalized she has received all of her belongings. Patient appropriate and cooperative, Denies SI/HI AVH. Vital signs taken. NAD noted.

## 2018-08-11 NOTE — ED Notes (Signed)
First bottle of oral contrast finished.  

## 2018-08-11 NOTE — ED Notes (Signed)
Patient has been standing in her door way for about 30 minutes, writer asked if she was ok, she asked me if I was ok. Patient said she was ready to go home, we (hospital) keep sending her from place to place and she is ready to go home. Patient says she dont know why we came to her house to bother her, informed patient she will be going to another facility, not quite sure if patient understood.

## 2018-08-11 NOTE — ED Provider Notes (Signed)
-----------------------------------------   2:43 AM on 08/11/2018 -----------------------------------------  Patient with episode of emesis after eating a snack.  Denies abdominal pain or nausea.  She did the same thing approximately 10:30 PM after eating.  Given patient's age and comorbidities, will obtain noncontrast CT abdomen/pelvis.   ----------------------------------------- 6:30 AM on 08/11/2018 -----------------------------------------  CT abdomen pelvis interpreted per Dr. Pascal Lux:  1. No acute finding. There is a small bowel containing right  inguinal hernia but no associated obstruction.  2. Moderate sliding hiatal hernia.  3. Calcification layers within the distended urinary bladder. There  is a punctate right renal calculus.   Repeat blood work including lactic acid unremarkable.  Patient has perked up and is now trying to get up and walk around.  Sitter at bedside.  Awaiting results of urinalysis.  To be transferred later this morning.   Paulette Blanch, MD 08/11/18 [phone]

## 2018-08-11 NOTE — ED Notes (Signed)
[employer]  For  transport

## 2018-08-12 DIAGNOSIS — K5900 Constipation, unspecified: Secondary | ICD-10-CM | POA: Diagnosis not present

## 2018-08-12 DIAGNOSIS — I10 Essential (primary) hypertension: Secondary | ICD-10-CM | POA: Diagnosis not present

## 2018-08-12 DIAGNOSIS — Z79899 Other long term (current) drug therapy: Secondary | ICD-10-CM | POA: Diagnosis not present

## 2018-08-12 DIAGNOSIS — N189 Chronic kidney disease, unspecified: Secondary | ICD-10-CM | POA: Diagnosis not present

## 2018-08-12 DIAGNOSIS — D519 Vitamin B12 deficiency anemia, unspecified: Secondary | ICD-10-CM | POA: Diagnosis not present

## 2018-08-13 DIAGNOSIS — D649 Anemia, unspecified: Secondary | ICD-10-CM | POA: Diagnosis not present

## 2018-08-13 DIAGNOSIS — Z79899 Other long term (current) drug therapy: Secondary | ICD-10-CM | POA: Diagnosis not present

## 2018-08-14 DIAGNOSIS — I10 Essential (primary) hypertension: Secondary | ICD-10-CM | POA: Diagnosis not present

## 2018-08-14 DIAGNOSIS — Z79899 Other long term (current) drug therapy: Secondary | ICD-10-CM | POA: Diagnosis not present

## 2018-08-15 DIAGNOSIS — I10 Essential (primary) hypertension: Secondary | ICD-10-CM | POA: Diagnosis not present

## 2018-08-16 DIAGNOSIS — I10 Essential (primary) hypertension: Secondary | ICD-10-CM | POA: Diagnosis not present

## 2018-08-16 LAB — CULTURE, BLOOD (ROUTINE X 2)
Culture: NO GROWTH
Culture: NO GROWTH
Special Requests: ADEQUATE
Special Requests: ADEQUATE

## 2018-08-17 DIAGNOSIS — I10 Essential (primary) hypertension: Secondary | ICD-10-CM | POA: Diagnosis not present

## 2018-08-18 DIAGNOSIS — R718 Other abnormality of red blood cells: Secondary | ICD-10-CM | POA: Diagnosis not present

## 2018-08-18 DIAGNOSIS — E559 Vitamin D deficiency, unspecified: Secondary | ICD-10-CM | POA: Diagnosis not present

## 2018-08-18 DIAGNOSIS — N184 Chronic kidney disease, stage 4 (severe): Secondary | ICD-10-CM | POA: Diagnosis not present

## 2018-08-18 DIAGNOSIS — R946 Abnormal results of thyroid function studies: Secondary | ICD-10-CM | POA: Diagnosis not present

## 2018-08-20 DIAGNOSIS — K5909 Other constipation: Secondary | ICD-10-CM | POA: Diagnosis not present

## 2018-08-20 DIAGNOSIS — R1030 Lower abdominal pain, unspecified: Secondary | ICD-10-CM | POA: Diagnosis not present

## 2018-08-21 DIAGNOSIS — I10 Essential (primary) hypertension: Secondary | ICD-10-CM | POA: Diagnosis not present

## 2018-08-22 DIAGNOSIS — Z79899 Other long term (current) drug therapy: Secondary | ICD-10-CM | POA: Diagnosis not present

## 2018-08-23 DIAGNOSIS — R031 Nonspecific low blood-pressure reading: Secondary | ICD-10-CM | POA: Diagnosis not present

## 2018-08-24 ENCOUNTER — Other Ambulatory Visit: Payer: Self-pay | Admitting: Family Medicine

## 2018-08-25 ENCOUNTER — Other Ambulatory Visit: Payer: Self-pay

## 2018-08-25 DIAGNOSIS — E785 Hyperlipidemia, unspecified: Secondary | ICD-10-CM | POA: Diagnosis not present

## 2018-08-25 DIAGNOSIS — K219 Gastro-esophageal reflux disease without esophagitis: Secondary | ICD-10-CM | POA: Diagnosis not present

## 2018-08-25 DIAGNOSIS — M109 Gout, unspecified: Secondary | ICD-10-CM | POA: Diagnosis not present

## 2018-08-25 DIAGNOSIS — E538 Deficiency of other specified B group vitamins: Secondary | ICD-10-CM | POA: Diagnosis not present

## 2018-08-25 DIAGNOSIS — N184 Chronic kidney disease, stage 4 (severe): Secondary | ICD-10-CM | POA: Diagnosis not present

## 2018-08-25 DIAGNOSIS — I129 Hypertensive chronic kidney disease with stage 1 through stage 4 chronic kidney disease, or unspecified chronic kidney disease: Secondary | ICD-10-CM | POA: Diagnosis not present

## 2018-08-28 ENCOUNTER — Ambulatory Visit (INDEPENDENT_AMBULATORY_CARE_PROVIDER_SITE_OTHER): Payer: Medicare Other | Admitting: Family Medicine

## 2018-08-28 ENCOUNTER — Encounter: Payer: Self-pay | Admitting: Family Medicine

## 2018-08-28 ENCOUNTER — Telehealth: Payer: Self-pay | Admitting: Family Medicine

## 2018-08-28 ENCOUNTER — Other Ambulatory Visit: Payer: Self-pay

## 2018-08-28 VITALS — BP 114/70 | HR 77 | Temp 97.5°F | Resp 20 | Ht 60.0 in | Wt 137.1 lb

## 2018-08-28 DIAGNOSIS — F0391 Unspecified dementia with behavioral disturbance: Secondary | ICD-10-CM | POA: Diagnosis not present

## 2018-08-28 NOTE — Telephone Encounter (Signed)
Spoke with Nursing Supervisor Tanya and left message since Sam was in the field and unable to take the verbal order.
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
## 2018-08-28 NOTE — Telephone Encounter (Signed)
Copied from [place] [phone]. Topic: Quick Communication - Home Health Verbal Orders >> Aug 28, 2018 10:24 AM Sheran Luz wrote: Caller/Agency: [facility] Number: [phone] Requesting Skilled Nursing Frequency: 1w1, 2w4 , 1w4 and 2prn

## 2018-08-31 DIAGNOSIS — N184 Chronic kidney disease, stage 4 (severe): Secondary | ICD-10-CM | POA: Diagnosis not present

## 2018-08-31 DIAGNOSIS — M109 Gout, unspecified: Secondary | ICD-10-CM | POA: Diagnosis not present

## 2018-08-31 DIAGNOSIS — E538 Deficiency of other specified B group vitamins: Secondary | ICD-10-CM | POA: Diagnosis not present

## 2018-08-31 DIAGNOSIS — E785 Hyperlipidemia, unspecified: Secondary | ICD-10-CM | POA: Diagnosis not present

## 2018-08-31 DIAGNOSIS — I129 Hypertensive chronic kidney disease with stage 1 through stage 4 chronic kidney disease, or unspecified chronic kidney disease: Secondary | ICD-10-CM | POA: Diagnosis not present

## 2018-08-31 DIAGNOSIS — K219 Gastro-esophageal reflux disease without esophagitis: Secondary | ICD-10-CM | POA: Diagnosis not present

## 2018-09-01 DIAGNOSIS — I129 Hypertensive chronic kidney disease with stage 1 through stage 4 chronic kidney disease, or unspecified chronic kidney disease: Secondary | ICD-10-CM | POA: Diagnosis not present

## 2018-09-01 DIAGNOSIS — E538 Deficiency of other specified B group vitamins: Secondary | ICD-10-CM | POA: Diagnosis not present

## 2018-09-01 DIAGNOSIS — N184 Chronic kidney disease, stage 4 (severe): Secondary | ICD-10-CM | POA: Diagnosis not present

## 2018-09-01 DIAGNOSIS — K219 Gastro-esophageal reflux disease without esophagitis: Secondary | ICD-10-CM | POA: Diagnosis not present

## 2018-09-01 DIAGNOSIS — E785 Hyperlipidemia, unspecified: Secondary | ICD-10-CM | POA: Diagnosis not present

## 2018-09-01 DIAGNOSIS — M109 Gout, unspecified: Secondary | ICD-10-CM | POA: Diagnosis not present

## 2018-09-04 ENCOUNTER — Telehealth: Payer: Self-pay | Admitting: Family Medicine

## 2018-09-04 DIAGNOSIS — E785 Hyperlipidemia, unspecified: Secondary | ICD-10-CM | POA: Diagnosis not present

## 2018-09-04 DIAGNOSIS — E538 Deficiency of other specified B group vitamins: Secondary | ICD-10-CM | POA: Diagnosis not present

## 2018-09-04 DIAGNOSIS — N184 Chronic kidney disease, stage 4 (severe): Secondary | ICD-10-CM | POA: Diagnosis not present

## 2018-09-04 DIAGNOSIS — K219 Gastro-esophageal reflux disease without esophagitis: Secondary | ICD-10-CM | POA: Diagnosis not present

## 2018-09-04 DIAGNOSIS — M109 Gout, unspecified: Secondary | ICD-10-CM | POA: Diagnosis not present

## 2018-09-04 DIAGNOSIS — I129 Hypertensive chronic kidney disease with stage 1 through stage 4 chronic kidney disease, or unspecified chronic kidney disease: Secondary | ICD-10-CM | POA: Diagnosis not present

## 2018-09-04 NOTE — Telephone Encounter (Signed)
Home Health Verbal Orders - Caller/Agency: Wes - [facility] Number: [phone] (can leave a voicemail) Requesting OT/PT/Skilled Nursing/Social Work/Speech Therapy: Physical Therapy Frequency:  2x a week for 2 weeks  1x a week for 1 week

## 2018-09-06 DIAGNOSIS — N184 Chronic kidney disease, stage 4 (severe): Secondary | ICD-10-CM | POA: Diagnosis not present

## 2018-09-06 DIAGNOSIS — I129 Hypertensive chronic kidney disease with stage 1 through stage 4 chronic kidney disease, or unspecified chronic kidney disease: Secondary | ICD-10-CM | POA: Diagnosis not present

## 2018-09-06 DIAGNOSIS — E785 Hyperlipidemia, unspecified: Secondary | ICD-10-CM | POA: Diagnosis not present

## 2018-09-06 DIAGNOSIS — K219 Gastro-esophageal reflux disease without esophagitis: Secondary | ICD-10-CM | POA: Diagnosis not present

## 2018-09-06 DIAGNOSIS — E538 Deficiency of other specified B group vitamins: Secondary | ICD-10-CM | POA: Diagnosis not present

## 2018-09-06 DIAGNOSIS — M109 Gout, unspecified: Secondary | ICD-10-CM | POA: Diagnosis not present

## 2018-09-07 DIAGNOSIS — I129 Hypertensive chronic kidney disease with stage 1 through stage 4 chronic kidney disease, or unspecified chronic kidney disease: Secondary | ICD-10-CM | POA: Diagnosis not present

## 2018-09-07 DIAGNOSIS — N184 Chronic kidney disease, stage 4 (severe): Secondary | ICD-10-CM | POA: Diagnosis not present

## 2018-09-07 DIAGNOSIS — M109 Gout, unspecified: Secondary | ICD-10-CM | POA: Diagnosis not present

## 2018-09-07 DIAGNOSIS — E785 Hyperlipidemia, unspecified: Secondary | ICD-10-CM | POA: Diagnosis not present

## 2018-09-07 DIAGNOSIS — E538 Deficiency of other specified B group vitamins: Secondary | ICD-10-CM | POA: Diagnosis not present

## 2018-09-07 DIAGNOSIS — K219 Gastro-esophageal reflux disease without esophagitis: Secondary | ICD-10-CM | POA: Diagnosis not present

## 2018-09-11 ENCOUNTER — Telehealth: Payer: Self-pay | Admitting: Family Medicine

## 2018-09-11 DIAGNOSIS — I129 Hypertensive chronic kidney disease with stage 1 through stage 4 chronic kidney disease, or unspecified chronic kidney disease: Secondary | ICD-10-CM | POA: Diagnosis not present

## 2018-09-11 DIAGNOSIS — N184 Chronic kidney disease, stage 4 (severe): Secondary | ICD-10-CM | POA: Diagnosis not present

## 2018-09-11 DIAGNOSIS — E785 Hyperlipidemia, unspecified: Secondary | ICD-10-CM | POA: Diagnosis not present

## 2018-09-11 DIAGNOSIS — M109 Gout, unspecified: Secondary | ICD-10-CM | POA: Diagnosis not present

## 2018-09-11 DIAGNOSIS — E538 Deficiency of other specified B group vitamins: Secondary | ICD-10-CM | POA: Diagnosis not present

## 2018-09-11 DIAGNOSIS — K219 Gastro-esophageal reflux disease without esophagitis: Secondary | ICD-10-CM | POA: Diagnosis not present

## 2018-09-11 NOTE — Telephone Encounter (Signed)
Home Health Verbal Orders - Caller/Agency: Kristin/[facility] Number: [phone]  Requesting OT/PT/Skilled Nursing/Social Work/Speech Therapy: Social Work  Frequency:

## 2018-09-13 DIAGNOSIS — M109 Gout, unspecified: Secondary | ICD-10-CM | POA: Diagnosis not present

## 2018-09-13 DIAGNOSIS — E785 Hyperlipidemia, unspecified: Secondary | ICD-10-CM | POA: Diagnosis not present

## 2018-09-13 DIAGNOSIS — N184 Chronic kidney disease, stage 4 (severe): Secondary | ICD-10-CM | POA: Diagnosis not present

## 2018-09-13 DIAGNOSIS — K219 Gastro-esophageal reflux disease without esophagitis: Secondary | ICD-10-CM | POA: Diagnosis not present

## 2018-09-13 DIAGNOSIS — I129 Hypertensive chronic kidney disease with stage 1 through stage 4 chronic kidney disease, or unspecified chronic kidney disease: Secondary | ICD-10-CM | POA: Diagnosis not present

## 2018-09-13 DIAGNOSIS — E538 Deficiency of other specified B group vitamins: Secondary | ICD-10-CM | POA: Diagnosis not present

## 2018-09-20 ENCOUNTER — Ambulatory Visit: Payer: Self-pay | Admitting: Family Medicine

## 2018-09-20 DIAGNOSIS — K219 Gastro-esophageal reflux disease without esophagitis: Secondary | ICD-10-CM | POA: Diagnosis not present

## 2018-09-20 DIAGNOSIS — M109 Gout, unspecified: Secondary | ICD-10-CM | POA: Diagnosis not present

## 2018-09-20 DIAGNOSIS — I129 Hypertensive chronic kidney disease with stage 1 through stage 4 chronic kidney disease, or unspecified chronic kidney disease: Secondary | ICD-10-CM | POA: Diagnosis not present

## 2018-09-20 DIAGNOSIS — N184 Chronic kidney disease, stage 4 (severe): Secondary | ICD-10-CM | POA: Diagnosis not present

## 2018-09-20 DIAGNOSIS — E785 Hyperlipidemia, unspecified: Secondary | ICD-10-CM | POA: Diagnosis not present

## 2018-09-20 DIAGNOSIS — E538 Deficiency of other specified B group vitamins: Secondary | ICD-10-CM | POA: Diagnosis not present

## 2018-09-22 DIAGNOSIS — M109 Gout, unspecified: Secondary | ICD-10-CM | POA: Diagnosis not present

## 2018-09-22 DIAGNOSIS — N184 Chronic kidney disease, stage 4 (severe): Secondary | ICD-10-CM | POA: Diagnosis not present

## 2018-09-22 DIAGNOSIS — K219 Gastro-esophageal reflux disease without esophagitis: Secondary | ICD-10-CM | POA: Diagnosis not present

## 2018-09-22 DIAGNOSIS — E785 Hyperlipidemia, unspecified: Secondary | ICD-10-CM | POA: Diagnosis not present

## 2018-09-22 DIAGNOSIS — E538 Deficiency of other specified B group vitamins: Secondary | ICD-10-CM | POA: Diagnosis not present

## 2018-09-22 DIAGNOSIS — I129 Hypertensive chronic kidney disease with stage 1 through stage 4 chronic kidney disease, or unspecified chronic kidney disease: Secondary | ICD-10-CM | POA: Diagnosis not present

## 2018-09-27 DIAGNOSIS — I129 Hypertensive chronic kidney disease with stage 1 through stage 4 chronic kidney disease, or unspecified chronic kidney disease: Secondary | ICD-10-CM | POA: Diagnosis not present

## 2018-09-27 DIAGNOSIS — E785 Hyperlipidemia, unspecified: Secondary | ICD-10-CM | POA: Diagnosis not present

## 2018-09-27 DIAGNOSIS — E538 Deficiency of other specified B group vitamins: Secondary | ICD-10-CM | POA: Diagnosis not present

## 2018-09-27 DIAGNOSIS — M109 Gout, unspecified: Secondary | ICD-10-CM | POA: Diagnosis not present

## 2018-09-27 DIAGNOSIS — K219 Gastro-esophageal reflux disease without esophagitis: Secondary | ICD-10-CM | POA: Diagnosis not present

## 2018-09-27 DIAGNOSIS — N184 Chronic kidney disease, stage 4 (severe): Secondary | ICD-10-CM | POA: Diagnosis not present

## 2018-09-28 ENCOUNTER — Telehealth: Payer: Self-pay | Admitting: Family Medicine

## 2018-09-28 NOTE — Telephone Encounter (Signed)
[facility] calling to get update on FL2.  They need to speak with someone today.  [phone]

## 2018-09-28 NOTE — Telephone Encounter (Signed)
[facility]SS need updated FL2 Form for placement for today or tomorrow

## 2018-09-29 ENCOUNTER — Ambulatory Visit (LOCAL_COMMUNITY_HEALTH_CENTER): Payer: Self-pay

## 2018-09-29 DIAGNOSIS — Z111 Encounter for screening for respiratory tuberculosis: Secondary | ICD-10-CM

## 2018-09-29 NOTE — Telephone Encounter (Signed)
Spoke with Education officer, museum and they need a update FL2 form today if all possible. They are trying to place Julia Herrera in [place] today due to her being able to ambulate extremely well. They feel as if they put her in the skilled nursing her health will decline. Julia Herrera is a locked down unit and with her confused and aggressiveness they don't feel as skilled nursing will be a good fit for the time being. With being in the locked down until in assisted living she will be able to communicate with the others and will be a better fit for her. As soon as we can get this form filled out they are asking for [place] to call them back so she does not have to wait over the weekend to be placed. [facility] is doing a COVID test and TB test on her this morning and taking her for breakfast while we wait for this paper work to be filled out.   Filled out updated FL2 Form with [facility] Recommended level of care with Assisted Living per Social Worker request and awaiting Dr. Ancil Boozer to sign.

## 2018-10-03 ENCOUNTER — Ambulatory Visit: Payer: Self-pay

## 2018-10-04 DIAGNOSIS — M1A00X0 Idiopathic chronic gout, unspecified site, without tophus (tophi): Secondary | ICD-10-CM | POA: Diagnosis not present

## 2018-10-04 DIAGNOSIS — I10 Essential (primary) hypertension: Secondary | ICD-10-CM | POA: Diagnosis not present

## 2018-10-04 DIAGNOSIS — D510 Vitamin B12 deficiency anemia due to intrinsic factor deficiency: Secondary | ICD-10-CM | POA: Diagnosis not present

## 2018-10-04 DIAGNOSIS — N183 Chronic kidney disease, stage 3 (moderate): Secondary | ICD-10-CM | POA: Diagnosis not present

## 2018-10-04 DIAGNOSIS — E785 Hyperlipidemia, unspecified: Secondary | ICD-10-CM | POA: Diagnosis not present

## 2018-10-13 DIAGNOSIS — N184 Chronic kidney disease, stage 4 (severe): Secondary | ICD-10-CM | POA: Diagnosis not present

## 2018-10-13 DIAGNOSIS — E785 Hyperlipidemia, unspecified: Secondary | ICD-10-CM | POA: Diagnosis not present

## 2018-10-13 DIAGNOSIS — I129 Hypertensive chronic kidney disease with stage 1 through stage 4 chronic kidney disease, or unspecified chronic kidney disease: Secondary | ICD-10-CM | POA: Diagnosis not present

## 2018-10-13 DIAGNOSIS — E538 Deficiency of other specified B group vitamins: Secondary | ICD-10-CM | POA: Diagnosis not present

## 2018-10-13 DIAGNOSIS — K219 Gastro-esophageal reflux disease without esophagitis: Secondary | ICD-10-CM | POA: Diagnosis not present

## 2018-10-13 DIAGNOSIS — M109 Gout, unspecified: Secondary | ICD-10-CM | POA: Diagnosis not present

## 2018-10-16 NOTE — Progress Notes (Signed)
DSS will have patient's facility read PPD. Aileen Fass, RN

## 2018-10-17 DIAGNOSIS — E785 Hyperlipidemia, unspecified: Secondary | ICD-10-CM | POA: Diagnosis not present

## 2018-10-17 DIAGNOSIS — I129 Hypertensive chronic kidney disease with stage 1 through stage 4 chronic kidney disease, or unspecified chronic kidney disease: Secondary | ICD-10-CM | POA: Diagnosis not present

## 2018-10-17 DIAGNOSIS — M109 Gout, unspecified: Secondary | ICD-10-CM | POA: Diagnosis not present

## 2018-10-17 DIAGNOSIS — E538 Deficiency of other specified B group vitamins: Secondary | ICD-10-CM | POA: Diagnosis not present

## 2018-10-17 DIAGNOSIS — N184 Chronic kidney disease, stage 4 (severe): Secondary | ICD-10-CM | POA: Diagnosis not present

## 2018-10-17 DIAGNOSIS — K219 Gastro-esophageal reflux disease without esophagitis: Secondary | ICD-10-CM | POA: Diagnosis not present

## 2018-10-23 DIAGNOSIS — I129 Hypertensive chronic kidney disease with stage 1 through stage 4 chronic kidney disease, or unspecified chronic kidney disease: Secondary | ICD-10-CM | POA: Diagnosis not present

## 2018-10-23 DIAGNOSIS — N184 Chronic kidney disease, stage 4 (severe): Secondary | ICD-10-CM | POA: Diagnosis not present

## 2018-10-23 DIAGNOSIS — M109 Gout, unspecified: Secondary | ICD-10-CM | POA: Diagnosis not present

## 2018-10-23 DIAGNOSIS — E538 Deficiency of other specified B group vitamins: Secondary | ICD-10-CM | POA: Diagnosis not present

## 2018-10-23 DIAGNOSIS — K219 Gastro-esophageal reflux disease without esophagitis: Secondary | ICD-10-CM | POA: Diagnosis not present

## 2018-10-23 DIAGNOSIS — E785 Hyperlipidemia, unspecified: Secondary | ICD-10-CM | POA: Diagnosis not present

## 2018-10-24 DIAGNOSIS — Z79899 Other long term (current) drug therapy: Secondary | ICD-10-CM | POA: Diagnosis not present

## 2018-10-26 DIAGNOSIS — Z79899 Other long term (current) drug therapy: Secondary | ICD-10-CM | POA: Diagnosis not present

## 2018-11-01 DIAGNOSIS — M1A00X0 Idiopathic chronic gout, unspecified site, without tophus (tophi): Secondary | ICD-10-CM | POA: Diagnosis not present

## 2018-11-01 DIAGNOSIS — D510 Vitamin B12 deficiency anemia due to intrinsic factor deficiency: Secondary | ICD-10-CM | POA: Diagnosis not present

## 2018-11-01 DIAGNOSIS — E785 Hyperlipidemia, unspecified: Secondary | ICD-10-CM | POA: Diagnosis not present

## 2018-11-01 DIAGNOSIS — I10 Essential (primary) hypertension: Secondary | ICD-10-CM | POA: Diagnosis not present

## 2018-11-03 DIAGNOSIS — G309 Alzheimer's disease, unspecified: Secondary | ICD-10-CM | POA: Diagnosis not present

## 2018-11-03 DIAGNOSIS — E538 Deficiency of other specified B group vitamins: Secondary | ICD-10-CM | POA: Diagnosis not present

## 2018-11-03 DIAGNOSIS — I129 Hypertensive chronic kidney disease with stage 1 through stage 4 chronic kidney disease, or unspecified chronic kidney disease: Secondary | ICD-10-CM | POA: Diagnosis not present

## 2018-11-03 DIAGNOSIS — E785 Hyperlipidemia, unspecified: Secondary | ICD-10-CM | POA: Diagnosis not present

## 2018-11-03 DIAGNOSIS — M109 Gout, unspecified: Secondary | ICD-10-CM | POA: Diagnosis not present

## 2018-11-03 DIAGNOSIS — K219 Gastro-esophageal reflux disease without esophagitis: Secondary | ICD-10-CM | POA: Diagnosis not present

## 2018-11-03 DIAGNOSIS — N184 Chronic kidney disease, stage 4 (severe): Secondary | ICD-10-CM | POA: Diagnosis not present

## 2018-11-06 DIAGNOSIS — G309 Alzheimer's disease, unspecified: Secondary | ICD-10-CM | POA: Diagnosis not present

## 2018-11-06 DIAGNOSIS — E785 Hyperlipidemia, unspecified: Secondary | ICD-10-CM | POA: Diagnosis not present

## 2018-11-06 DIAGNOSIS — N184 Chronic kidney disease, stage 4 (severe): Secondary | ICD-10-CM | POA: Diagnosis not present

## 2018-11-06 DIAGNOSIS — K219 Gastro-esophageal reflux disease without esophagitis: Secondary | ICD-10-CM | POA: Diagnosis not present

## 2018-11-06 DIAGNOSIS — M109 Gout, unspecified: Secondary | ICD-10-CM | POA: Diagnosis not present

## 2018-11-06 DIAGNOSIS — I129 Hypertensive chronic kidney disease with stage 1 through stage 4 chronic kidney disease, or unspecified chronic kidney disease: Secondary | ICD-10-CM | POA: Diagnosis not present

## 2018-11-06 DIAGNOSIS — E538 Deficiency of other specified B group vitamins: Secondary | ICD-10-CM | POA: Diagnosis not present

## 2018-11-20 DIAGNOSIS — G309 Alzheimer's disease, unspecified: Secondary | ICD-10-CM | POA: Diagnosis not present

## 2018-11-20 DIAGNOSIS — E538 Deficiency of other specified B group vitamins: Secondary | ICD-10-CM | POA: Diagnosis not present

## 2018-11-20 DIAGNOSIS — N184 Chronic kidney disease, stage 4 (severe): Secondary | ICD-10-CM | POA: Diagnosis not present

## 2018-11-20 DIAGNOSIS — I129 Hypertensive chronic kidney disease with stage 1 through stage 4 chronic kidney disease, or unspecified chronic kidney disease: Secondary | ICD-10-CM | POA: Diagnosis not present

## 2018-11-20 DIAGNOSIS — M109 Gout, unspecified: Secondary | ICD-10-CM | POA: Diagnosis not present

## 2018-11-20 DIAGNOSIS — E785 Hyperlipidemia, unspecified: Secondary | ICD-10-CM | POA: Diagnosis not present

## 2018-11-20 DIAGNOSIS — K219 Gastro-esophageal reflux disease without esophagitis: Secondary | ICD-10-CM | POA: Diagnosis not present

## 2018-11-27 DIAGNOSIS — N3000 Acute cystitis without hematuria: Secondary | ICD-10-CM | POA: Diagnosis not present

## 2018-11-27 DIAGNOSIS — R451 Restlessness and agitation: Secondary | ICD-10-CM | POA: Diagnosis not present

## 2018-11-27 DIAGNOSIS — I10 Essential (primary) hypertension: Secondary | ICD-10-CM | POA: Diagnosis not present

## 2018-11-28 DIAGNOSIS — Z79899 Other long term (current) drug therapy: Secondary | ICD-10-CM | POA: Diagnosis not present

## 2018-11-28 DIAGNOSIS — N390 Urinary tract infection, site not specified: Secondary | ICD-10-CM | POA: Diagnosis not present

## 2018-12-03 ENCOUNTER — Encounter: Payer: Self-pay | Admitting: Emergency Medicine

## 2018-12-03 ENCOUNTER — Other Ambulatory Visit: Payer: Self-pay

## 2018-12-03 ENCOUNTER — Emergency Department
Admission: EM | Admit: 2018-12-03 | Discharge: 2018-12-04 | Disposition: A | Discharge status: Skilled Nursing Facility | Payer: Medicare Other | Attending: Emergency Medicine | Admitting: Emergency Medicine

## 2018-12-03 DIAGNOSIS — N184 Chronic kidney disease, stage 4 (severe): Secondary | ICD-10-CM | POA: Diagnosis not present

## 2018-12-03 DIAGNOSIS — I129 Hypertensive chronic kidney disease with stage 1 through stage 4 chronic kidney disease, or unspecified chronic kidney disease: Secondary | ICD-10-CM | POA: Diagnosis not present

## 2018-12-03 DIAGNOSIS — N3000 Acute cystitis without hematuria: Secondary | ICD-10-CM | POA: Insufficient documentation

## 2018-12-03 DIAGNOSIS — E875 Hyperkalemia: Secondary | ICD-10-CM | POA: Diagnosis not present

## 2018-12-03 DIAGNOSIS — R404 Transient alteration of awareness: Secondary | ICD-10-CM | POA: Diagnosis not present

## 2018-12-03 DIAGNOSIS — F0390 Unspecified dementia without behavioral disturbance: Secondary | ICD-10-CM | POA: Insufficient documentation

## 2018-12-03 DIAGNOSIS — N189 Chronic kidney disease, unspecified: Secondary | ICD-10-CM

## 2018-12-03 DIAGNOSIS — R4182 Altered mental status, unspecified: Secondary | ICD-10-CM | POA: Diagnosis present

## 2018-12-03 LAB — CBC WITH DIFFERENTIAL/PLATELET
Abs Immature Granulocytes: 0.05 10*3/uL (ref 0.00–0.07)
Basophils Absolute: 0 10*3/uL (ref 0.0–0.1)
Basophils Relative: 1 %
Eosinophils Absolute: 0.3 10*3/uL (ref 0.0–0.5)
Eosinophils Relative: 4 %
HCT: 31.2 % — ABNORMAL LOW (ref 36.0–46.0)
Hemoglobin: 9.3 g/dL — ABNORMAL LOW (ref 12.0–15.0)
Immature Granulocytes: 1 %
Lymphocytes Relative: 19 %
Lymphs Abs: 1.3 10*3/uL (ref 0.7–4.0)
MCH: 20.4 pg — ABNORMAL LOW (ref 26.0–34.0)
MCHC: 29.8 g/dL — ABNORMAL LOW (ref 30.0–36.0)
MCV: 68.3 fL — ABNORMAL LOW (ref 80.0–100.0)
Monocytes Absolute: 0.6 10*3/uL (ref 0.1–1.0)
Monocytes Relative: 9 %
Neutro Abs: 4.4 10*3/uL (ref 1.7–7.7)
Neutrophils Relative %: 66 %
Platelets: 369 10*3/uL (ref 150–400)
RBC: 4.57 MIL/uL (ref 3.87–5.11)
RDW: 22.5 % — ABNORMAL HIGH (ref 11.5–15.5)
WBC: 6.7 10*3/uL (ref 4.0–10.5)
nRBC: 0 % (ref 0.0–0.2)

## 2018-12-03 LAB — BASIC METABOLIC PANEL
Anion gap: 8 (ref 5–15)
BUN: 21 mg/dL (ref 8–23)
CO2: 20 mmol/L — ABNORMAL LOW (ref 22–32)
Calcium: 9.7 mg/dL (ref 8.9–10.3)
Chloride: 109 mmol/L (ref 98–111)
Creatinine, Ser: 2.08 mg/dL — ABNORMAL HIGH (ref 0.44–1.00)
GFR calc Af Amer: 24 mL/min — ABNORMAL LOW (ref >60)
GFR calc non Af Amer: 20 mL/min — ABNORMAL LOW (ref >60)
Glucose, Bld: 113 mg/dL — ABNORMAL HIGH (ref 70–99)
Potassium: 5.5 mmol/L — ABNORMAL HIGH (ref 3.5–5.1)
Sodium: 137 mmol/L (ref 135–145)

## 2018-12-03 LAB — URINALYSIS, COMPLETE (UACMP) WITH MICROSCOPIC
Bilirubin Urine: NEGATIVE
Glucose, UA: NEGATIVE mg/dL
Hgb urine dipstick: NEGATIVE
Ketones, ur: NEGATIVE mg/dL
Nitrite: NEGATIVE
Protein, ur: NEGATIVE mg/dL
Specific Gravity, Urine: 1.01 (ref 1.005–1.030)
pH: 7 (ref 5.0–8.0)

## 2018-12-03 MED ORDER — CEPHALEXIN 250 MG PO CAPS: 250.0000 mg | ORAL_CAPSULE | Freq: Two times a day (BID) | ORAL | 0 refills | Status: AC

## 2018-12-03 MED ORDER — CEPHALEXIN 500 MG PO CAPS
500.0000 mg | ORAL_CAPSULE | Freq: Once | ORAL | Status: AC
  Administered 2018-12-03: 500 mg via ORAL
  Filled 2018-12-03: qty 1

## 2018-12-03 MED ORDER — PATIROMER SORBITEX CALCIUM 8.4 G PO PACK
8.4000 g | PACK | Freq: Once | ORAL | Status: AC
  Administered 2018-12-03: 23:00:00 8.4 g via ORAL
  Filled 2018-12-03: qty 1
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
## 2018-12-03 NOTE — ED Notes (Signed)
Son at bedside.

## 2018-12-03 NOTE — ED Notes (Signed)
ED Provider at bedside. 

## 2018-12-03 NOTE — Discharge Instructions (Addendum)
You need to have a recheck of your kidney function and your potassium levels this week by her doctor.

## 2018-12-03 NOTE — ED Triage Notes (Signed)
Patient brought in by ems from [facility]. Per EMS that staff reported that the patient was more agitated then baseline. Per EMS the patient has dementia and the staff were arguing with her and making her more agitated. Patient calm and cooperative at this time.

## 2018-12-04 DIAGNOSIS — Z743 Need for continuous supervision: Secondary | ICD-10-CM | POA: Diagnosis not present

## 2018-12-04 DIAGNOSIS — R279 Unspecified lack of coordination: Secondary | ICD-10-CM | POA: Diagnosis not present

## 2018-12-04 DIAGNOSIS — N3000 Acute cystitis without hematuria: Secondary | ICD-10-CM | POA: Diagnosis not present

## 2018-12-04 DIAGNOSIS — I10 Essential (primary) hypertension: Secondary | ICD-10-CM | POA: Diagnosis not present

## 2018-12-04 DIAGNOSIS — N183 Chronic kidney disease, stage 3 (moderate): Secondary | ICD-10-CM | POA: Diagnosis not present

## 2018-12-04 LAB — PATHOLOGIST SMEAR REVIEW

## 2018-12-05 DIAGNOSIS — E785 Hyperlipidemia, unspecified: Secondary | ICD-10-CM | POA: Diagnosis not present

## 2018-12-05 DIAGNOSIS — K219 Gastro-esophageal reflux disease without esophagitis: Secondary | ICD-10-CM | POA: Diagnosis not present

## 2018-12-05 DIAGNOSIS — G309 Alzheimer's disease, unspecified: Secondary | ICD-10-CM | POA: Diagnosis not present

## 2018-12-05 DIAGNOSIS — N184 Chronic kidney disease, stage 4 (severe): Secondary | ICD-10-CM | POA: Diagnosis not present

## 2018-12-05 DIAGNOSIS — E538 Deficiency of other specified B group vitamins: Secondary | ICD-10-CM | POA: Diagnosis not present

## 2018-12-05 DIAGNOSIS — I129 Hypertensive chronic kidney disease with stage 1 through stage 4 chronic kidney disease, or unspecified chronic kidney disease: Secondary | ICD-10-CM | POA: Diagnosis not present

## 2018-12-05 DIAGNOSIS — M109 Gout, unspecified: Secondary | ICD-10-CM | POA: Diagnosis not present

## 2018-12-06 LAB — URINE CULTURE: Culture: 40000 — AB

## 2018-12-08 ENCOUNTER — Encounter: Payer: Self-pay | Admitting: Emergency Medicine

## 2018-12-08 ENCOUNTER — Emergency Department
Admission: EM | Admit: 2018-12-08 | Discharge: 2018-12-10 | Disposition: A | Discharge status: Skilled Nursing Facility | Payer: Medicare Other | Attending: Emergency Medicine | Admitting: Emergency Medicine

## 2018-12-08 DIAGNOSIS — R404 Transient alteration of awareness: Secondary | ICD-10-CM | POA: Diagnosis not present

## 2018-12-08 DIAGNOSIS — E875 Hyperkalemia: Secondary | ICD-10-CM | POA: Diagnosis not present

## 2018-12-08 DIAGNOSIS — Z79899 Other long term (current) drug therapy: Secondary | ICD-10-CM | POA: Diagnosis not present

## 2018-12-08 DIAGNOSIS — I129 Hypertensive chronic kidney disease with stage 1 through stage 4 chronic kidney disease, or unspecified chronic kidney disease: Secondary | ICD-10-CM | POA: Insufficient documentation

## 2018-12-08 DIAGNOSIS — N183 Chronic kidney disease, stage 3 (moderate): Secondary | ICD-10-CM | POA: Diagnosis not present

## 2018-12-08 DIAGNOSIS — F0391 Unspecified dementia with behavioral disturbance: Secondary | ICD-10-CM | POA: Diagnosis not present

## 2018-12-08 DIAGNOSIS — R451 Restlessness and agitation: Secondary | ICD-10-CM | POA: Diagnosis present

## 2018-12-08 DIAGNOSIS — F22 Delusional disorders: Secondary | ICD-10-CM | POA: Insufficient documentation

## 2018-12-08 DIAGNOSIS — N3000 Acute cystitis without hematuria: Secondary | ICD-10-CM | POA: Diagnosis not present

## 2018-12-08 DIAGNOSIS — Z03818 Encounter for observation for suspected exposure to other biological agents ruled out: Secondary | ICD-10-CM | POA: Diagnosis not present

## 2018-12-08 DIAGNOSIS — Z7982 Long term (current) use of aspirin: Secondary | ICD-10-CM | POA: Diagnosis not present

## 2018-12-08 DIAGNOSIS — R4689 Other symptoms and signs involving appearance and behavior: Secondary | ICD-10-CM | POA: Diagnosis not present

## 2018-12-08 DIAGNOSIS — Z20828 Contact with and (suspected) exposure to other viral communicable diseases: Secondary | ICD-10-CM | POA: Insufficient documentation
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
## 2018-12-09 DIAGNOSIS — R451 Restlessness and agitation: Secondary | ICD-10-CM

## 2018-12-09 LAB — CBC WITH DIFFERENTIAL/PLATELET
Abs Immature Granulocytes: 0.07 10*3/uL (ref 0.00–0.07)
Basophils Absolute: 0 10*3/uL (ref 0.0–0.1)
Basophils Relative: 1 %
Eosinophils Absolute: 0.4 10*3/uL (ref 0.0–0.5)
Eosinophils Relative: 6 %
HCT: 29.8 % — ABNORMAL LOW (ref 36.0–46.0)
Hemoglobin: 8.6 g/dL — ABNORMAL LOW (ref 12.0–15.0)
Immature Granulocytes: 1 %
Lymphocytes Relative: 25 %
Lymphs Abs: 1.5 10*3/uL (ref 0.7–4.0)
MCH: 20 pg — ABNORMAL LOW (ref 26.0–34.0)
MCHC: 28.9 g/dL — ABNORMAL LOW (ref 30.0–36.0)
MCV: 69.5 fL — ABNORMAL LOW (ref 80.0–100.0)
Monocytes Absolute: 0.6 10*3/uL (ref 0.1–1.0)
Monocytes Relative: 11 %
Neutro Abs: 3.4 10*3/uL (ref 1.7–7.7)
Neutrophils Relative %: 56 %
Platelets: 304 10*3/uL (ref 150–400)
RBC: 4.29 MIL/uL (ref 3.87–5.11)
RDW: 22.8 % — ABNORMAL HIGH (ref 11.5–15.5)
WBC: 5.9 10*3/uL (ref 4.0–10.5)
nRBC: 0 % (ref 0.0–0.2)

## 2018-12-09 LAB — URINE DRUG SCREEN, QUALITATIVE (ARMC ONLY)
Amphetamines, Ur Screen: NOT DETECTED
Barbiturates, Ur Screen: NOT DETECTED
Benzodiazepine, Ur Scrn: POSITIVE — AB
Cannabinoid 50 Ng, Ur ~~LOC~~: NOT DETECTED
Cocaine Metabolite,Ur ~~LOC~~: NOT DETECTED
MDMA (Ecstasy)Ur Screen: NOT DETECTED
Methadone Scn, Ur: NOT DETECTED
Opiate, Ur Screen: NOT DETECTED
Phencyclidine (PCP) Ur S: NOT DETECTED
Tricyclic, Ur Screen: NOT DETECTED

## 2018-12-09 LAB — ACETAMINOPHEN LEVEL: Acetaminophen (Tylenol), Serum: <10 ug/mL — ABNORMAL LOW (ref 10–30)

## 2018-12-09 LAB — URINALYSIS, COMPLETE (UACMP) WITH MICROSCOPIC
Bilirubin Urine: NEGATIVE
Glucose, UA: NEGATIVE mg/dL
Hgb urine dipstick: NEGATIVE
Ketones, ur: NEGATIVE mg/dL
Nitrite: NEGATIVE
Protein, ur: NEGATIVE mg/dL
Specific Gravity, Urine: 1.012 (ref 1.005–1.030)
pH: 7 (ref 5.0–8.0)

## 2018-12-09 LAB — COMPREHENSIVE METABOLIC PANEL
ALT: 14 U/L (ref 0–44)
AST: 21 U/L (ref 15–41)
Albumin: 3.6 g/dL (ref 3.5–5.0)
Alkaline Phosphatase: 56 U/L (ref 38–126)
Anion gap: 8 (ref 5–15)
BUN: 23 mg/dL (ref 8–23)
CO2: 19 mmol/L — ABNORMAL LOW (ref 22–32)
Calcium: 9.1 mg/dL (ref 8.9–10.3)
Chloride: 110 mmol/L (ref 98–111)
Creatinine, Ser: 2.25 mg/dL — ABNORMAL HIGH (ref 0.44–1.00)
GFR calc Af Amer: 21 mL/min — ABNORMAL LOW (ref >60)
GFR calc non Af Amer: 18 mL/min — ABNORMAL LOW (ref >60)
Glucose, Bld: 102 mg/dL — ABNORMAL HIGH (ref 70–99)
Potassium: 5.4 mmol/L — ABNORMAL HIGH (ref 3.5–5.1)
Sodium: 137 mmol/L (ref 135–145)
Total Bilirubin: 0.6 mg/dL (ref 0.3–1.2)
Total Protein: 6.6 g/dL (ref 6.5–8.1)

## 2018-12-09 LAB — SALICYLATE LEVEL: Salicylate Lvl: <7 mg/dL (ref 2.8–30.0)

## 2018-12-09 LAB — ETHANOL: Alcohol, Ethyl (B): <10 mg/dL (ref <10)

## 2018-12-09 MED ORDER — ATORVASTATIN CALCIUM 20 MG PO TABS
20.0000 mg | ORAL_TABLET | Freq: Every day | ORAL | Status: DC
  Filled 2018-12-09: qty 1

## 2018-12-09 MED ORDER — ACETAMINOPHEN 500 MG PO TABS
500.0000 mg | ORAL_TABLET | Freq: Once | ORAL | Status: AC
  Administered 2018-12-09: 500 mg via ORAL

## 2018-12-09 MED ORDER — PANTOPRAZOLE SODIUM 40 MG PO TBEC
40.0000 mg | DELAYED_RELEASE_TABLET | Freq: Every day | ORAL | Status: DC
  Administered 2018-12-10: 12:00:00 40 mg via ORAL
  Filled 2018-12-09 (×2): qty 1

## 2018-12-09 MED ORDER — VITAMIN B-6 50 MG PO TABS
100.0000 mg | ORAL_TABLET | Freq: Every day | ORAL | Status: DC
  Administered 2018-12-10: 100 mg via ORAL
  Filled 2018-12-09 (×2): qty 2

## 2018-12-09 MED ORDER — FOSFOMYCIN TROMETHAMINE 3 G PO PACK
3.0000 g | PACK | Freq: Once | ORAL | Status: AC
  Administered 2018-12-09: 3 g via ORAL
  Filled 2018-12-09: qty 3

## 2018-12-09 MED ORDER — B-12 500 MCG SL SUBL: 1.0000 | SUBLINGUAL_TABLET | Freq: Every day | SUBLINGUAL | Status: DC

## 2018-12-09 MED ORDER — FERROUS SULFATE 220 (44 FE) MG/5ML PO ELIX
220.0000 mg | ORAL_SOLUTION | Freq: Every day | ORAL | Status: DC
  Administered 2018-12-10: 12:00:00 220 mg via ORAL
  Filled 2018-12-09 (×2): qty 5

## 2018-12-09 MED ORDER — CEPHALEXIN 250 MG PO CAPS
250.0000 mg | ORAL_CAPSULE | Freq: Three times a day (TID) | ORAL | Status: DC
  Filled 2018-12-09: qty 1

## 2018-12-09 MED ORDER — CYANOCOBALAMIN 500 MCG PO TABS
500.0000 ug | ORAL_TABLET | Freq: Every day | ORAL | Status: DC
  Filled 2018-12-09: qty 1

## 2018-12-09 MED ORDER — CEPHALEXIN 250 MG PO CAPS
250.0000 mg | ORAL_CAPSULE | Freq: Two times a day (BID) | ORAL | Status: DC
  Administered 2018-12-09: 250 mg via ORAL

## 2018-12-09 MED ORDER — ASPIRIN EC 81 MG PO TBEC
81.0000 mg | DELAYED_RELEASE_TABLET | Freq: Every day | ORAL | Status: DC
  Administered 2018-12-10: 12:00:00 81 mg via ORAL
  Filled 2018-12-09 (×2): qty 1

## 2018-12-09 MED ORDER — RISPERIDONE 1 MG PO TABS: 0.5000 mg | ORAL_TABLET | Freq: Every day | ORAL | Status: DC

## 2018-12-09 MED ORDER — ALLOPURINOL 100 MG PO TABS
100.0000 mg | ORAL_TABLET | Freq: Two times a day (BID) | ORAL | Status: DC
  Administered 2018-12-10: 100 mg via ORAL
  Filled 2018-12-09 (×4): qty 1

## 2018-12-09 MED ORDER — DIVALPROEX SODIUM 125 MG PO CSDR
125.0000 mg | DELAYED_RELEASE_CAPSULE | Freq: Two times a day (BID) | ORAL | Status: DC
  Administered 2018-12-09 – 2018-12-10 (×2): 125 mg via ORAL
  Filled 2018-12-09 (×4): qty 1

## 2018-12-09 MED ORDER — PATIROMER SORBITEX CALCIUM 8.4 G PO PACK
8.4000 g | PACK | Freq: Every day | ORAL | Status: DC
  Administered 2018-12-09 – 2018-12-10 (×2): 8.4 g via ORAL
  Filled 2018-12-09 (×2): qty 1

## 2018-12-09 MED ORDER — IRBESARTAN 75 MG PO TABS
75.0000 mg | ORAL_TABLET | Freq: Every day | ORAL | Status: DC
  Filled 2018-12-09: qty 1

## 2018-12-09 NOTE — Consult Note (Addendum)
[facility] Consult   Reason for Consult:  Agitation and aggression Referring Physician:  EDP Patient Identification: Julia Herrera MRN:  [id number] Principal Diagnosis: Agitation Diagnosis:  Principal Problem:   Agitation  Total Time spent with patient: 1 hour  Subjective:   Julia Herrera is a 85 y.o. female patient sent to the ED after becoming agitated at her facility and trying to hit staff.  "I'm doing alright."  When asked about getting upset at her facility she stated, "that old yellow bitch with braids."  "You're writing down everything I say.  I ain't saying shit."  Patient seen and evaluated in person by this provider.  Irritable on assessment.  Her facility also reports an increase in agitation and aggression. She does have a UTI, antibiotic started along with mood medications to assist with her anger outbursts.  No suicidal/homicidal ideations, hallucinations, or substance abuse.  She does have a diagnosis noted of dementia but no medications and no neurology note.  Her agitation does start in the late afternoon, early evening.  Suspect sundowning, medications placed to assist with this issue.  HPI per TTS:  Julia Herrera is an 85 y.o. female who presents to the ER from her care facility, [facility]. Per the care home staff Marye Round), the patient behaviors started approximately a week away. She becomes agitated late in the day, after lunch. In the past, the patient had a UTI, which caused the agitation. Last night (12/08/2018), the patient was trying to hit staff and other residents with a plastic bottle. The night before (12/07/2018), she stood in the door and wouldn't let two staff members leave her room.  Per the report of the patient, she was brought to the ER because of the "yellow bitch with the braids." She will not give further information about what happened. When asked additional questions, she stated, "I'm not telling you shit cause you witting all this  down."  Past Psychiatric History: agitation  Risk to Self: Suicidal Ideation: No Suicidal Intent: No Is patient at risk for suicide?: No Suicidal Plan?: No Access to Means: No What has been your use of drugs/alcohol within the last 12 months?: Reports of none Other Self Harm Risks: Reports of none Triggers for Past Attempts: Other (Comment)(Reports of none) Intentional Self Injurious Behavior: None Risk to Others: Homicidal Ideation: No Thoughts of Harm to Others: No Current Homicidal Intent: No Current Homicidal Plan: No Access to Homicidal Means: No Identified Victim: Reports of none History of harm to others?: No Assessment of Violence: None Noted Violent Behavior Description: Reports of none Does patient have access to weapons?: No Criminal Charges Pending?: No Does patient have a court date: No Prior Inpatient Therapy: Prior Inpatient Therapy: Yes Prior Therapy Dates: 07/2018 Prior Therapy Facilty/Provider(s): [facility] Reason for Treatment: Agitation Prior Outpatient Therapy: Prior Outpatient Therapy: No Does patient have an ACCT team?: No Does patient have Intensive In-House Services?  : No Does patient have [facility] services? : No Does patient have P4CC services?: No  Past Medical History:  Past Medical History:  Diagnosis Date  . Chronic kidney disease   . Dementia ([place])   . Gout   . Hyperlipidemia   . Hypertension   . Osteoporosis     Past Surgical History:  Procedure Laterality Date  . CHOLECYSTECTOMY     Family History:  Family History  Problem Relation Age of Onset  . Alcohol abuse Mother   . Heart attack Father   . Heart  disease Brother    Family Psychiatric  History: see above Social History:  Social History   Substance and Sexual Activity  Alcohol Use No  . Alcohol/week: 0.0 standard drinks     Social History   Substance and Sexual Activity  Drug Use No    Social History   Socioeconomic History  . Marital status:  Single    Spouse name: Not on file  . Number of children: 3  . Years of education: Not on file  . Highest education level: 7th grade  Occupational History    Employer: Retired  Scientific laboratory technician  . Financial resource strain: Not hard at all  . Food insecurity    Worry: Never true    Inability: Never true  . Transportation needs    Medical: No    Non-medical: No  Tobacco Use  . Smoking status: Never Smoker  . Smokeless tobacco: Current User    Types: Snuff  Substance and Sexual Activity  . Alcohol use: No    Alcohol/week: 0.0 standard drinks  . Drug use: No  . Sexual activity: Not Currently    Partners: Male  Lifestyle  . Physical activity    Days per week: 0 days    Minutes per session: 0 min  . Stress: Not at all  Relationships  . Social Herbalist on phone: Patient refused    Gets together: Patient refused    Attends religious service: Patient refused    Active member of club or organization: Patient refused    Attends meetings of clubs or organizations: Patient refused    Relationship status: Patient refused  Other Topics Concern  . Not on file  Social History Narrative  . Not on file   Additional Social History:    Allergies:   Allergies  Allergen Reactions  . Allopurinol Other (See Comments)    dizziness  . Fosamax [Alendronate] Nausea Only  . Niaspan [Niacin Er] Other (See Comments)    unknown  . Zocor [Simvastatin] Other (See Comments)    Muscle pain    Labs:  Results for orders placed or performed during the hospital encounter of 12/08/18 (from the past 48 hour(s))  Ethanol     Status: None   Collection Time: 12/09/18 12:06 AM  Result Value Ref Range   Alcohol, Ethyl (B) <10 <10 mg/dL    Comment: (NOTE) Lowest detectable limit for serum alcohol is 10 mg/dL. For medical purposes only. Performed at [facility], [address] [place], [place] [address]   CBC with Differential     Status: Abnormal   Collection Time: 12/09/18  12:06 AM  Result Value Ref Range   WBC 5.9 4.0 - 10.5 K/uL   RBC 4.29 3.87 - 5.11 MIL/uL   Hemoglobin 8.6 (L) 12.0 - 15.0 g/dL    Comment: Reticulocyte Hemoglobin testing may be clinically indicated, consider ordering this additional test [id number]    HCT 29.8 (L) 36.0 - 46.0 %   MCV 69.5 (L) 80.0 - 100.0 fL   MCH 20.0 (L) 26.0 - 34.0 pg   MCHC 28.9 (L) 30.0 - 36.0 g/dL   RDW 22.8 (H) 11.5 - 15.5 %   Platelets 304 150 - 400 K/uL   nRBC 0.0 0.0 - 0.2 %   Neutrophils Relative % 56 %   Neutro Abs 3.4 1.7 - 7.7 K/uL   Lymphocytes Relative 25 %   Lymphs Abs 1.5 0.7 - 4.0 K/uL   Monocytes Relative 11 %  Monocytes Absolute 0.6 0.1 - 1.0 K/uL   Eosinophils Relative 6 %   Eosinophils Absolute 0.4 0.0 - 0.5 K/uL   Basophils Relative 1 %   Basophils Absolute 0.0 0.0 - 0.1 K/uL   Immature Granulocytes 1 %   Abs Immature Granulocytes 0.07 0.00 - 0.07 K/uL    Comment: Performed at [facility], [address] [place], [place] [address]  Comprehensive metabolic panel     Status: Abnormal   Collection Time: 12/09/18 12:06 AM  Result Value Ref Range   Sodium 137 135 - 145 mmol/L   Potassium 5.4 (H) 3.5 - 5.1 mmol/L   Chloride 110 98 - 111 mmol/L   CO2 19 (L) 22 - 32 mmol/L   Glucose, Bld 102 (H) 70 - 99 mg/dL   BUN 23 8 - 23 mg/dL   Creatinine, Ser 2.25 (H) 0.44 - 1.00 mg/dL   Calcium 9.1 8.9 - 10.3 mg/dL   Total Protein 6.6 6.5 - 8.1 g/dL   Albumin 3.6 3.5 - 5.0 g/dL   AST 21 15 - 41 U/L   ALT 14 0 - 44 U/L   Alkaline Phosphatase 56 38 - 126 U/L   Total Bilirubin 0.6 0.3 - 1.2 mg/dL   GFR calc non Af Amer 18 (L) >60 mL/min   GFR calc Af Amer 21 (L) >60 mL/min   Anion gap 8 5 - 15    Comment: Performed at [facility], [address] [place], [place] [id number]  Salicylate level     Status: None   Collection Time: 12/09/18 12:06 AM  Result Value Ref Range   Salicylate Lvl [id number] 2.8 - 30.0 mg/dL    Comment: Performed at [facility], [address] [place], [place] [address]  Acetaminophen level     Status: Abnormal   Collection Time: 12/09/18 12:06 AM  Result Value Ref Range   Acetaminophen (Tylenol), Serum <10 (L) 10 - 30 ug/mL    Comment: (NOTE) Therapeutic concentrations vary significantly. A range of 10-30 ug/mL  may be an effective concentration for many patients. However, some  are best treated at concentrations outside of this range. Acetaminophen concentrations >150 ug/mL at 4 hours after ingestion  and >50 ug/mL at 12 hours after ingestion are often associated with  toxic reactions. Performed at [facility], [address] [place], [place] [address]   Urinalysis, Complete w Microscopic     Status: Abnormal   Collection Time: 12/09/18  3:30 AM  Result Value Ref Range   Color, Urine YELLOW (A) YELLOW   APPearance CLEAR (A) CLEAR   Specific Gravity, Urine 1.012 1.005 - 1.030   pH 7.0 5.0 - 8.0   Glucose, UA NEGATIVE NEGATIVE mg/dL   Hgb urine dipstick NEGATIVE NEGATIVE   Bilirubin Urine NEGATIVE NEGATIVE   Ketones, ur NEGATIVE NEGATIVE mg/dL   Protein, ur NEGATIVE NEGATIVE mg/dL   Nitrite NEGATIVE NEGATIVE   Leukocytes,Ua LARGE (A) NEGATIVE   RBC / HPF 0-5 0 - 5 RBC/hpf   WBC, UA 21-50 0 - 5 WBC/hpf   Bacteria, UA RARE (A) NONE SEEN   Squamous Epithelial / LPF 6-10 0 - 5    Comment: Performed at [facility], [address], [place], [place] [address]  Urine Drug Screen, Qualitative     Status: Abnormal   Collection Time: 12/09/18  3:30 AM  Result Value Ref Range   Tricyclic, Ur Screen NONE DETECTED NONE DETECTED   Amphetamines, Ur Screen NONE DETECTED  NONE DETECTED   MDMA (Ecstasy)Ur Screen NONE DETECTED NONE DETECTED   Cocaine Metabolite,Ur [place] NONE DETECTED NONE DETECTED   Opiate, Ur Screen NONE DETECTED NONE DETECTED   Phencyclidine (PCP) Ur S NONE DETECTED NONE DETECTED   Cannabinoid 50 Ng, Ur [place] NONE DETECTED NONE DETECTED   Barbiturates, Ur Screen NONE DETECTED NONE DETECTED    Benzodiazepine, Ur Scrn POSITIVE (A) NONE DETECTED   Methadone Scn, Ur NONE DETECTED NONE DETECTED    Comment: (NOTE) Tricyclics + metabolites, urine    Cutoff 1000 ng/mL Amphetamines + metabolites, urine  Cutoff 1000 ng/mL MDMA (Ecstasy), urine              Cutoff 500 ng/mL Cocaine Metabolite, urine          Cutoff 300 ng/mL Opiate + metabolites, urine        Cutoff 300 ng/mL Phencyclidine (PCP), urine         Cutoff 25 ng/mL Cannabinoid, urine                 Cutoff 50 ng/mL Barbiturates + metabolites, urine  Cutoff 200 ng/mL Benzodiazepine, urine              Cutoff 200 ng/mL Methadone, urine                   Cutoff 300 ng/mL The urine drug screen provides only a preliminary, unconfirmed analytical test result and should not be used for non-medical purposes. Clinical consideration and professional judgment should be applied to any positive drug screen result due to possible interfering substances. A more specific alternate chemical method must be used in order to obtain a confirmed analytical result. Gas chromatography / mass spectrometry (GC/MS) is the preferred confirmat ory method. Performed at [facility], [address] [place], [place] [address]     Current Facility-Administered Medications  Medication Dose Route Frequency Provider Last Rate Last Dose  . allopurinol (ZYLOPRIM) tablet 100 mg  100 mg Oral BID Patrecia Pour, NP      . aspirin EC tablet 81 mg  81 mg Oral Daily Blake Divine, MD      . atorvastatin (LIPITOR) tablet 20 mg  20 mg Oral QHS Patrecia Pour, NP      . cephALEXin (KEFLEX) capsule 250 mg  250 mg Oral Q8H Lord, Jamison Y, NP      . divalproex (DEPAKOTE SPRINKLE) capsule 125 mg  125 mg Oral Q12H Lord, Jamison Y, NP      . ferrous sulfate 220 (44 Fe) MG/5ML solution 220 mg  220 mg Oral Q breakfast Blake Divine, MD      . irbesartan (AVAPRO) tablet 75 mg  75 mg Oral Daily Lord, Jamison Y, NP      . pantoprazole (PROTONIX) EC tablet 40  mg  40 mg Oral Daily Patrecia Pour, NP      . patiromer Daryll Drown) packet 8.4 g  8.4 g Oral Daily Blake Divine, MD   8.4 g at 12/09/18 0351  . pyridOXINE (VITAMIN B-6) tablet 100 mg  100 mg Oral Daily Waylan Boga Y, NP      . risperiDONE (RISPERDAL) tablet 0.5 mg  0.5 mg Oral Daily Patrecia Pour, NP      . vitamin B-12 (CYANOCOBALAMIN) tablet 500 mcg  500 mcg Oral Daily Gerald Dexter, [facility]       Current Outpatient Medications  Medication Sig Dispense Refill  . allopurinol (ZYLOPRIM) 100 MG tablet Take 1 tablet (  100 mg total) by mouth 2 (two) times daily. 180 tablet 0  . aspirin EC 81 MG tablet Take 81 mg by mouth daily.    Marland Kitchen atorvastatin (LIPITOR) 20 MG tablet Take 1 tablet (20 mg total) by mouth at bedtime. 90 tablet 0  . cephALEXin (KEFLEX) 250 MG capsule Take 1 capsule (250 mg total) by mouth 2 (two) times daily for 7 days. 14 capsule 0  . Cyanocobalamin (B-12) 500 MCG SUBL Place 1 tablet under the tongue daily. 30 tablet 2  . ergocalciferol (VITAMIN D2) 1.25 MG (50000 UT) capsule Take 50,000 Units by mouth once a week.    . famotidine (PEPCID) 20 MG tablet Take 10 mg by mouth daily.    Marland Kitchen omeprazole (PRILOSEC) 20 MG capsule TAKE 1 CAPSULE BY MOUTH DAILY 90 capsule 0  . potassium chloride (K-DUR,KLOR-CON) 10 MEQ tablet Take 10 mEq by mouth daily.    Marland Kitchen pyridoxine (B-6) 100 MG tablet Take 100 mg by mouth daily.    . valsartan (DIOVAN) 80 MG tablet Take 1 tablet (80 mg total) by mouth daily. 90 tablet 0    Musculoskeletal: Strength & Muscle Tone: within normal limits Gait & Station: normal Patient leans: N/A  Psychiatric Specialty Exam: Physical Exam  Nursing note and vitals reviewed. Constitutional: She is oriented to person, place, and time. She appears well-developed and well-nourished.  HENT:  Head: Normocephalic.  Neck: Normal range of motion.  Respiratory: Effort normal.  Musculoskeletal: Normal range of motion.  Neurological: She is alert and oriented to  person, place, and time.  Psychiatric: Her speech is normal and behavior is normal. Thought content normal. Her mood appears anxious. Her affect is labile. Cognition and memory are impaired. She expresses impulsivity.    Review of Systems  Psychiatric/Behavioral: Positive for memory loss. The patient is nervous/anxious.   All other systems reviewed and are negative.   Blood pressure 102/68, pulse 68, temperature 98 F (36.7 C), temperature source Axillary, resp. rate 16, SpO2 99 %.There is no height or weight on file to calculate BMI.  General Appearance: Casual  Eye Contact:  Fair  Speech:  Normal Rate  Volume:  Normal  Mood:  Anxious and Irritable  Affect:  Congruent  Thought Process:  Coherent and Descriptions of Associations: Intact  Orientation:  Full (Time, Place, and Person)  Thought Content:  WDL and Logical  Suicidal Thoughts:  No  Homicidal Thoughts:  No  Memory:  Immediate;   Fair Recent;   Fair Remote;   Fair  Judgement:  Impaired  Insight:  Lacking  Psychomotor Activity:  Decreased  Concentration:  Concentration: Fair and Attention Span: Fair  Recall:  [employer] of Knowledge:  Fair  Language:  Good  Akathisia:  No  Handed:  Right  AIMS (if indicated):     Assets:  Housing Leisure Time Resilience Social Support  ADL's:  Impaired  Cognition:  Impaired,  Mild  Sleep:        Treatment Plan Summary: Daily contact with patient to assess and evaluate symptoms and progress in treatment, Medication management and Plan agitation:  -Started Depakote 125 mg BID  -Started Risperdal 0.5 mg at 4 pm prior to her agitation episodes  Disposition: Supportive therapy provided about ongoing stressors.  Waylan Boga, NP 12/09/2018 3:49 PM   Case discussed and plan agreed upon as outlined above.

## 2018-12-09 NOTE — ED Notes (Signed)
Resting quietly with eyes closed. No acute distress noted.

## 2018-12-09 NOTE — ED Notes (Signed)
Continues to rest quietly at this time.

## 2018-12-09 NOTE — ED Notes (Signed)
Pt ambulated to the toilet. Then back to bed. Set up the pt's lunch for her with a ginger ale. Pt loud, angry but will cooperate.

## 2018-12-09 NOTE — ED Notes (Signed)
Pt refusing dinner.

## 2018-12-09 NOTE — BH Assessment (Signed)
Assessment Note  Julia Herrera is an 85 y.o. female who presents to the ER from her care facility, [facility]. Per the care home staff Marye Round), the patient behaviors started approximately a week away. She becomes agitated late in the day, after lunch. In the past, the patient had a UTI, which caused the agitation. Last night (12/08/2018), the patient was trying to hit staff and other residents with a plastic bottle. The night before (12/07/2018), she stood in the door and wouldn't let two staff members leave her room.  Per the report of the patient, she was brought to the ER because of the "yellow bitch with the braids." She will not give further information about what happened. When asked additional questions, she stated, "I'm not telling you shit cause you witting all this down."  Diagnosis: Dementia with behavioral disturbance  Past Medical History:  Past Medical History:  Diagnosis Date  . Chronic kidney disease   . Dementia ([place])   . Gout   . Hyperlipidemia   . Hypertension   . Osteoporosis     Past Surgical History:  Procedure Laterality Date  . CHOLECYSTECTOMY      Family History:  Family History  Problem Relation Age of Onset  . Alcohol abuse Mother   . Heart attack Father   . Heart disease Brother     Social History:  reports that she has never smoked. Her smokeless tobacco use includes snuff. She reports that she does not drink alcohol or use drugs.  Additional Social History:  Alcohol / Drug Use Pain Medications: See PTA Prescriptions: See PTA Over the Counter: See PTA History of alcohol / drug use?: No history of alcohol / drug abuse Longest period of sobriety (when/how long): None reported  CIWA: CIWA-Ar BP: 102/68 Pulse Rate: 68 COWS:    Allergies:  Allergies  Allergen Reactions  . Allopurinol Other (See Comments)    dizziness  . Fosamax [Alendronate] Nausea Only  . Niaspan [Niacin Er] Other (See Comments)    unknown  . Zocor [Simvastatin]  Other (See Comments)    Muscle pain    Home Medications: (Not in a hospital admission)   OB/GYN Status:  No LMP recorded. Patient is postmenopausal.  General Assessment Data Location of Assessment: [facility] ED TTS Assessment: In system Is this a Tele or Face-to-Face Assessment?: Face-to-Face Is this an Initial Assessment or a Re-assessment for this encounter?: Initial Assessment Language Other than English: No Living Arrangements: In Group Home: (Comment: Name of Group Home)([facility]) What gender do you identify as?: Female Pregnancy Status: No Living Arrangements: Other (Comment)([facility]) Can pt return to current living arrangement?: Yes Admission Status: Involuntary Petitioner: ED Attending Is patient capable of signing voluntary admission?: No(Under IVC) Referral Source: Other([facility]) Insurance type: Medicaid  Medical Screening Exam ([place]) Medical Exam completed: Yes  Crisis Care Plan Living Arrangements: Other (Comment)([facility]) Legal Guardian: Other:(Self) Name of Psychiatrist: Reports of none Name of Therapist: Reports of none  Education Status Is patient currently in school?: No Is the patient employed, unemployed or receiving disability?: Unemployed  Risk to self with the past 6 months Suicidal Ideation: No Has patient been a risk to self within the past 6 months prior to admission? : No Suicidal Intent: No Has patient had any suicidal intent within the past 6 months prior to admission? : No Is patient at risk for suicide?: No Suicidal Plan?: No Has patient had any suicidal plan within the past 6 months prior to  admission? : No Access to Means: No What has been your use of drugs/alcohol within the last 12 months?: Reports of none Previous Attempts/Gestures: No Other Self Harm Risks: Reports of none Triggers for Past Attempts: Other (Comment)(Reports of none) Intentional Self Injurious Behavior: None Family Suicide  History: Unknown Recent stressful life event(s): Other (Comment) Persecutory voices/beliefs?: No Depression: No Substance abuse history and/or treatment for substance abuse?: No Suicide prevention information given to non-admitted patients: Not applicable  Risk to Others within the past 6 months Homicidal Ideation: No Does patient have any lifetime risk of violence toward others beyond the six months prior to admission? : No Thoughts of Harm to Others: No Current Homicidal Intent: No Current Homicidal Plan: No Access to Homicidal Means: No Identified Victim: Reports of none History of harm to others?: No Assessment of Violence: None Noted Violent Behavior Description: Reports of none Does patient have access to weapons?: No Criminal Charges Pending?: No Does patient have a court date: No Is patient on probation?: No  Psychosis Hallucinations: None noted Delusions: None noted  Mental Status Report Appearance/Hygiene: Unremarkable, In scrubs Eye Contact: Fair Motor Activity: Freedom of movement, Unremarkable Speech: Logical/coherent, Unremarkable Level of Consciousness: Alert Mood: Irritable, Angry, Suspicious, Pleasant Affect: Appropriate to circumstance Anxiety Level: None Thought Processes: Coherent, Relevant Judgement: Unimpaired Orientation: Person, Place, Time, Situation, Appropriate for developmental age Obsessive Compulsive Thoughts/Behaviors: None  Cognitive Functioning Concentration: Normal Memory: Recent Intact, Remote Intact Is patient IDD: No Insight: Fair Impulse Control: Fair Appetite: Good Have you had any weight changes? : No Change Sleep: No Change Total Hours of Sleep: 8 Vegetative Symptoms: None  ADLScreening [facility]) Patient's cognitive ability adequate to safely complete daily activities?: Yes Patient able to express need for assistance with ADLs?: Yes Independently performs ADLs?: Yes (appropriate for developmental  age)  Prior Inpatient Therapy Prior Inpatient Therapy: Yes Prior Therapy Dates: 07/2018 Prior Therapy Facilty/Provider(s): [facility] Reason for Treatment: Agitation  Prior Outpatient Therapy Prior Outpatient Therapy: No Does patient have an ACCT team?: No Does patient have Intensive In-House Services?  : No Does patient have [facility] services? : No Does patient have P4CC services?: No  ADL Screening (condition at time of admission) Patient's cognitive ability adequate to safely complete daily activities?: Yes Is the patient deaf or have difficulty hearing?: No Does the patient have difficulty seeing, even when wearing glasses/contacts?: No Does the patient have difficulty concentrating, remembering, or making decisions?: No Patient able to express need for assistance with ADLs?: Yes Does the patient have difficulty dressing or bathing?: No Independently performs ADLs?: Yes (appropriate for developmental age) Does the patient have difficulty walking or climbing stairs?: No Weakness of Legs: None Weakness of Arms/Hands: None  Home Assistive Devices/Equipment Home Assistive Devices/Equipment: None  Therapy Consults (therapy consults require a physician order) PT Evaluation Needed: No OT Evalulation Needed: No SLP Evaluation Needed: No Abuse/Neglect Assessment (Assessment to be complete while patient is alone) Abuse/Neglect Assessment Can Be Completed: Yes Physical Abuse: Denies Verbal Abuse: Denies Sexual Abuse: Denies Exploitation of patient/patient's resources: Denies Self-Neglect: Denies Values / Beliefs Cultural Requests During Hospitalization: None Spiritual Requests During Hospitalization: None Consults Spiritual Care Consult Needed: No Social Work Consult Needed: No      Child/Adolescent Assessment Running Away Risk: Denies(Patient is adult)  Disposition:  Disposition Initial Assessment Completed for this Encounter: Yes  On Site Evaluation  by:   Reviewed with Physician:    Gunnar Fusi MS, LCAS, [facility], [place] Therapeutic Triage Specialist 12/09/2018 12:06  PM

## 2018-12-09 NOTE — ED Notes (Addendum)
Keflex cancelled d/t pt refusing to take any medication and changed to fosamax q every 3 days. depakote sprinkles placed in ginger ale for the pt.

## 2018-12-10 DIAGNOSIS — R404 Transient alteration of awareness: Secondary | ICD-10-CM | POA: Diagnosis not present

## 2018-12-10 DIAGNOSIS — R4689 Other symptoms and signs involving appearance and behavior: Secondary | ICD-10-CM

## 2018-12-10 DIAGNOSIS — R451 Restlessness and agitation: Secondary | ICD-10-CM | POA: Diagnosis not present

## 2018-12-10 DIAGNOSIS — Z743 Need for continuous supervision: Secondary | ICD-10-CM | POA: Diagnosis not present

## 2018-12-10 DIAGNOSIS — R279 Unspecified lack of coordination: Secondary | ICD-10-CM | POA: Diagnosis not present

## 2018-12-10 LAB — SARS CORONAVIRUS 2 BY RT PCR (HOSPITAL ORDER, PERFORMED IN ~~LOC~~ HOSPITAL LAB): SARS Coronavirus 2: NEGATIVE

## 2018-12-10 LAB — URINE CULTURE

## 2018-12-10 MED ORDER — DIVALPROEX SODIUM 125 MG PO CSDR: 125.0000 mg | DELAYED_RELEASE_CAPSULE | Freq: Two times a day (BID) | ORAL | 2 refills | Status: DC

## 2018-12-10 MED ORDER — RISPERIDONE 0.5 MG PO TABS: 0.5000 mg | ORAL_TABLET | Freq: Every day | ORAL | 0 refills | Status: AC

## 2018-12-10 MED ORDER — FERROUS SULFATE 220 (44 FE) MG/5ML PO ELIX: 220.0000 mg | ORAL_SOLUTION | Freq: Every day | ORAL | 3 refills | Status: DC

## 2018-12-10 NOTE — ED Notes (Signed)
Pt refuses to answer questions regarding orientation. Pt defensive and angry when asked questions.

## 2018-12-10 NOTE — ED Provider Notes (Signed)
-----------------------------------------   4:25 AM on 12/10/2018 -----------------------------------------  Blood pressure 102/68, pulse 68, temperature 98 F (36.7 C), temperature source Axillary, resp. rate 16, SpO2 99 %.  The patient is calm and cooperative at this time.  Patient's ARB was restarted during the day, however this was canceled due to patient's hyperkalemia and AKI.  Awaiting disposition plan from Behavioral Medicine team.   Blake Divine, MD 12/10/18 [phone]
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
## 2018-12-10 NOTE — ED Notes (Signed)
Pt changed and provided with a breakfast tray. Pt sweater was wet with urine and placed in her belonging bag.   perwick placed by Edinson Domeier tech and dorian tech.

## 2018-12-10 NOTE — ED Notes (Signed)
This RN spoke with [facility]. Med tech [place] given report on pt. This RN spoke with Supervisor Brandy who stated [facility] was unwilling to take pt back due to pt's behaviors. This RN reported to [place] that pt has been medically cleared, given new rx for help with behavior, and is acting compliant. Theadora Rama stated that she is speaking with her supervisor due to not wanting pt back. This RN told Theadora Rama that [facility] can not refuse pt without 30 day notice. EMS called to transport pt as [facility] is not providing transportation. Per Ian Malkin, [facility] is aware as of 12/09/2018 that they will be taking pt back.

## 2018-12-10 NOTE — ED Notes (Signed)
Med tech [place] at [facility] notified that EMS has picked up pt for transport.

## 2018-12-10 NOTE — ED Provider Notes (Signed)
Pt cleared to go back to facility by psych.    Vanessa , MD 12/10/18 [phone]

## 2018-12-10 NOTE — BH Assessment (Signed)
Writer spoke with patient to complete updated/reassement. Patient states she is doing well and want to go back to the facility . Per nursing staff, patient had no problems overnight.  Writer called and left a HIPPA Compliant message with [facility] [phone]), requesting a return phone call.

## 2018-12-10 NOTE — Discharge Instructions (Addendum)
You were cleared by psychiatry team to go back to [facility].     Dementia Dementia is a condition that affects the way the brain works. It often affects memory and thinking. There are many types of dementia. Some types get worse with time and cannot be reversed. Some types of dementia include:  Alzheimer's disease. This is the most common type.  Vascular dementia. This type may happen due to a stroke.  Lewy body dementia. This type may happen to people who have Parkinson's disease.  Frontotemporal dementia. This type is caused by damage to nerve cells in certain parts of the brain. Some people may have more than one type, and this is called mixed dementia. What are the causes? This condition is caused by damage to cells in the brain. Some causes that cannot be reversed include:  Having a condition that affects the blood vessels of the brain, such as diabetes, heart disease, or blood vessel disease.  Changes to genes. Some causes that can be reversed or slowed include:  Injury to the brain.  Certain medicines.  Infection.  Not having enough vitamin B12 in the body, or thyroid problems.  A tumor or blood clot in the brain. What are the signs or symptoms? Symptoms depend on the type of dementia. This may include:  Problems remembering things.  Having trouble taking a bath or putting clothes on.  Forgetting appointments.  Forgetting to pay bills.  Trouble planning and making meals.  Having trouble speaking.  Getting lost easily. How is this treated? Treatment depends on the cause of the dementia. It might include taking medicines that help:  To control the dementia.  To slow down the dementia.  To manage symptoms. In some cases, treating the cause of your dementia can improve symptoms, reverse symptoms, or slow down how quickly it gets worse. Your doctor can help you find support groups and other doctors who can help with your care. Follow these instructions  at home: Medicines  Take over-the-counter and prescription medicines only as told by your doctor.  Use a pill organizer to help you manage your medicines.  Avoidtaking medicines for pain or for sleep. Lifestyle  Make healthy choices: ? Be active as told by your doctor. ? Do not use any products that contain nicotine or tobacco, such as cigarettes, e-cigarettes, and chewing tobacco. If you need help quitting, ask your doctor. ? Do not drink alcohol. ? When you get stressed, do something that will help you to relax. Your doctor can give you tips. ? Spend time with other people.  Make sure you get good sleep. To get good sleep: ? Try not to take naps during the day. ? Keep your bedroom dark and cool. ? In the few hours before you go to bed, try not to do any exercise. ? Do not have foods and drinks with caffeine at night. Eating and drinking  Drink enough fluid to keep your pee (urine) pale yellow.  Eat a healthy diet. General instructions   Talk with your doctor to figure out: ? What you need help with. ? What your safety needs are.  Ask your doctor if it is safe for you to drive.  If told, wear a bracelet that tracks where you are or shows that you are a person with memory loss.  Work with your family to make big decisions.  Keep all follow-up visits as told by your doctor. This is important. Contact a doctor if:  You have any new  symptoms.  Your symptoms get worse.  You have problems with swallowing or choking. Get help right away if:  You feel very sad, or feel that you want to harm yourself.  You or your family members are worried for your safety. If you ever feel like you may hurt yourself or others, or have thoughts about taking your own life, get help right away. You can go to your nearest emergency department or call:  Your local emergency services (911 in the [place]  A suicide crisis helpline, such as the [facility] at  (502)260-3546. This is open 24 hours a day. Summary  Dementia often affects memory and thinking.  Some types of dementia get worse with time and cannot be reversed.  Treatment for this condition depends on the cause.  Talk with your doctor to figure out what you need help with.  Your doctor can help you find support groups and other doctors who can help with your care. This information is not intended to replace advice given to you by your health care provider. Make sure you discuss any questions you have with your health care provider. Document Released: 02/12/2008 Document Revised: 05/16/2018 Document Reviewed: 05/16/2018 Elsevier Patient Education  2020 [employer].

## 2018-12-11 DIAGNOSIS — I10 Essential (primary) hypertension: Secondary | ICD-10-CM | POA: Diagnosis not present

## 2018-12-11 DIAGNOSIS — N3000 Acute cystitis without hematuria: Secondary | ICD-10-CM | POA: Diagnosis not present

## 2018-12-14 DIAGNOSIS — 419620001 Death: Secondary | SNOMED CT | POA: Diagnosis not present

## 2018-12-14 DEATH — deceased

## 2019-02-19 DIAGNOSIS — I10 Essential (primary) hypertension: Secondary | ICD-10-CM | POA: Diagnosis not present

## 2019-02-19 DIAGNOSIS — R451 Restlessness and agitation: Secondary | ICD-10-CM | POA: Diagnosis not present

## 2019-03-14 DIAGNOSIS — R451 Restlessness and agitation: Secondary | ICD-10-CM | POA: Diagnosis not present

## 2019-03-14 DIAGNOSIS — N1832 Chronic kidney disease, stage 3b: Secondary | ICD-10-CM | POA: Diagnosis not present

## 2019-03-14 DIAGNOSIS — E876 Hypokalemia: Secondary | ICD-10-CM | POA: Diagnosis not present

## 2019-03-14 DIAGNOSIS — I10 Essential (primary) hypertension: Secondary | ICD-10-CM | POA: Diagnosis not present

## 2019-04-19 ENCOUNTER — Emergency Department: Payer: Medicare Other

## 2019-04-19 ENCOUNTER — Other Ambulatory Visit: Payer: Self-pay

## 2019-04-19 ENCOUNTER — Telehealth: Payer: Self-pay | Admitting: Adult Health

## 2019-04-19 ENCOUNTER — Emergency Department
Admission: EM | Admit: 2019-04-19 | Discharge: 2019-04-19 | Disposition: A | Discharge status: Home / Self Care | Payer: Medicare Other | Attending: Student | Admitting: Student

## 2019-04-19 DIAGNOSIS — R531 Weakness: Secondary | ICD-10-CM | POA: Diagnosis not present

## 2019-04-19 DIAGNOSIS — U071 COVID-19: Secondary | ICD-10-CM | POA: Diagnosis not present

## 2019-04-19 DIAGNOSIS — F17220 Nicotine dependence, chewing tobacco, uncomplicated: Secondary | ICD-10-CM | POA: Insufficient documentation

## 2019-04-19 DIAGNOSIS — I959 Hypotension, unspecified: Secondary | ICD-10-CM | POA: Diagnosis not present

## 2019-04-19 DIAGNOSIS — Z79899 Other long term (current) drug therapy: Secondary | ICD-10-CM | POA: Insufficient documentation

## 2019-04-19 DIAGNOSIS — Z7401 Bed confinement status: Secondary | ICD-10-CM | POA: Diagnosis not present

## 2019-04-19 DIAGNOSIS — N184 Chronic kidney disease, stage 4 (severe): Secondary | ICD-10-CM | POA: Diagnosis not present

## 2019-04-19 DIAGNOSIS — R059 Cough, unspecified: Secondary | ICD-10-CM

## 2019-04-19 DIAGNOSIS — I129 Hypertensive chronic kidney disease with stage 1 through stage 4 chronic kidney disease, or unspecified chronic kidney disease: Secondary | ICD-10-CM | POA: Diagnosis not present

## 2019-04-19 DIAGNOSIS — Z7982 Long term (current) use of aspirin: Secondary | ICD-10-CM | POA: Diagnosis not present

## 2019-04-19 DIAGNOSIS — I10 Essential (primary) hypertension: Secondary | ICD-10-CM | POA: Diagnosis not present

## 2019-04-19 DIAGNOSIS — R05 Cough: Secondary | ICD-10-CM

## 2019-04-19 DIAGNOSIS — R404 Transient alteration of awareness: Secondary | ICD-10-CM | POA: Diagnosis not present

## 2019-04-19 DIAGNOSIS — F0390 Unspecified dementia without behavioral disturbance: Secondary | ICD-10-CM | POA: Insufficient documentation

## 2019-04-19 DIAGNOSIS — M2550 Pain in unspecified joint: Secondary | ICD-10-CM | POA: Diagnosis not present

## 2019-04-19 DIAGNOSIS — Z743 Need for continuous supervision: Secondary | ICD-10-CM | POA: Diagnosis not present

## 2019-04-19 LAB — CBC WITH DIFFERENTIAL/PLATELET
Abs Immature Granulocytes: 0.01 10*3/uL (ref 0.00–0.07)
Basophils Absolute: 0 10*3/uL (ref 0.0–0.1)
Basophils Relative: 0 %
Eosinophils Absolute: 0 10*3/uL (ref 0.0–0.5)
Eosinophils Relative: 0 %
HCT: 35.5 % — ABNORMAL LOW (ref 36.0–46.0)
Hemoglobin: 10.8 g/dL — ABNORMAL LOW (ref 12.0–15.0)
Immature Granulocytes: 0 %
Lymphocytes Relative: 35 %
Lymphs Abs: 0.8 10*3/uL (ref 0.7–4.0)
MCH: 23.5 pg — ABNORMAL LOW (ref 26.0–34.0)
MCHC: 30.4 g/dL (ref 30.0–36.0)
MCV: 77.2 fL — ABNORMAL LOW (ref 80.0–100.0)
Monocytes Absolute: 0.4 10*3/uL (ref 0.1–1.0)
Monocytes Relative: 17 %
Neutro Abs: 1.1 10*3/uL — ABNORMAL LOW (ref 1.7–7.7)
Neutrophils Relative %: 48 %
Platelets: 169 10*3/uL (ref 150–400)
RBC: 4.6 MIL/uL (ref 3.87–5.11)
RDW: 23.4 % — ABNORMAL HIGH (ref 11.5–15.5)
Smear Review: NORMAL
WBC: 2.2 10*3/uL — ABNORMAL LOW (ref 4.0–10.5)
nRBC: 0 % (ref 0.0–0.2)

## 2019-04-19 LAB — BASIC METABOLIC PANEL
Anion gap: 9 (ref 5–15)
BUN: 33 mg/dL — ABNORMAL HIGH (ref 8–23)
CO2: 19 mmol/L — ABNORMAL LOW (ref 22–32)
Calcium: 8.6 mg/dL — ABNORMAL LOW (ref 8.9–10.3)
Chloride: 112 mmol/L — ABNORMAL HIGH (ref 98–111)
Creatinine, Ser: 2.34 mg/dL — ABNORMAL HIGH (ref 0.44–1.00)
GFR calc Af Amer: 20 mL/min — ABNORMAL LOW (ref >60)
GFR calc non Af Amer: 18 mL/min — ABNORMAL LOW (ref >60)
Glucose, Bld: 93 mg/dL (ref 70–99)
Potassium: 5.3 mmol/L — ABNORMAL HIGH (ref 3.5–5.1)
Sodium: 140 mmol/L (ref 135–145)

## 2019-04-19 LAB — LACTIC ACID, PLASMA: Lactic Acid, Venous: 1.4 mmol/L (ref 0.5–1.9)

## 2019-04-19 LAB — PROCALCITONIN: Procalcitonin: <0.1 ng/mL

## 2019-04-19 MED ORDER — DEXAMETHASONE SODIUM PHOSPHATE 10 MG/ML IJ SOLN
8.0000 mg | Freq: Once | INTRAMUSCULAR | Status: AC
  Administered 2019-04-19: 8 mg via INTRAVENOUS
  Filled 2019-04-19: qty 1

## 2019-04-19 MED ORDER — SODIUM CHLORIDE 0.9 % IV BOLUS
500.0000 mL | Freq: Once | INTRAVENOUS | Status: AC
  Administered 2019-04-19: 500 mL via INTRAVENOUS

## 2019-04-19 NOTE — ED Notes (Signed)
Pt son contacted regarding pt status. He was notified pt is being discharged but states he is unable to transport pt back to facility. [facility] also called and Brandy given update regarding pt discharge status and they also state pt unable to transport pt back to facility and pt would have to be transported via ems.

## 2019-04-19 NOTE — ED Provider Notes (Signed)
Patient received in signout from Dr. Joan Mayans pending blood work and reassessment.  Blood work is reassuring and actually improved from baseline.  Does have leukopenia which would be consistent with Covid positive state.  She is not hypoxic.  No hypotension.  Lactate is normal.  Does not meet criteria for sepsis.  There is no hypoxia no indication for hospitalization at this time.  Will be given referral to infusion center given her age and comorbidities.   Merlyn Lot, MD 04/19/19 [phone]

## 2019-04-19 NOTE — ED Triage Notes (Signed)
pt arrive via ems from [facility]. EMS states that facility reported hypotension and cough. EMS reports pt is covid + but unable to find results in chart. Pt alert and oriented to self only, hx of dementia. No coughing noted during triage. NAD at this time  Ems vitals  BP 84/51 96% room air HR 80

## 2019-04-19 NOTE — ED Notes (Signed)
[facility] called back to give permission to treat.  Mickel Fuchs granted permission.  Any further contact needed call Carly Recher [phone]
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
## 2019-04-19 NOTE — Telephone Encounter (Signed)
Tried to contact patient regarding her positive COVID-19 test results and discuss how she is feeling.  Also discussed with her as she is in a high risk category group for [id number] complications and to assess if she is a candidate for the monoclonal antibody infusion. Patient is currently in the emergency room. Unable to reach patient will try her back tomorrow  Georgann Bramble NP-C

## 2019-04-20 ENCOUNTER — Telehealth: Payer: Self-pay | Admitting: Adult Health

## 2019-04-20 NOTE — Telephone Encounter (Signed)
Tried to contact patient regarding her positive COVID-19 test results and discuss how she is feeling.  Also discuss with her as she is in a high risk category group for [id number] complications and to assess if she is a candidate for the monoclonal antibody infusion.  . Unable to reach patient. Left message on voice mail to call back   Heinrich Fertig NP-C

## 2019-04-21 ENCOUNTER — Emergency Department
Admission: EM | Admit: 2019-04-21 | Discharge: 2019-04-21 | Disposition: A | Discharge status: Skilled Nursing Facility | Payer: Medicare Other | Attending: Emergency Medicine | Admitting: Emergency Medicine

## 2019-04-21 ENCOUNTER — Other Ambulatory Visit: Payer: Self-pay

## 2019-04-21 DIAGNOSIS — U071 COVID-19: Secondary | ICD-10-CM | POA: Insufficient documentation

## 2019-04-21 DIAGNOSIS — Z79899 Other long term (current) drug therapy: Secondary | ICD-10-CM | POA: Diagnosis not present

## 2019-04-21 DIAGNOSIS — I959 Hypotension, unspecified: Secondary | ICD-10-CM | POA: Diagnosis not present

## 2019-04-21 DIAGNOSIS — Z743 Need for continuous supervision: Secondary | ICD-10-CM | POA: Diagnosis not present

## 2019-04-21 DIAGNOSIS — R404 Transient alteration of awareness: Secondary | ICD-10-CM | POA: Diagnosis not present

## 2019-04-21 DIAGNOSIS — I10 Essential (primary) hypertension: Secondary | ICD-10-CM | POA: Diagnosis not present

## 2019-04-21 DIAGNOSIS — F0390 Unspecified dementia without behavioral disturbance: Secondary | ICD-10-CM | POA: Diagnosis not present

## 2019-04-21 DIAGNOSIS — R410 Disorientation, unspecified: Secondary | ICD-10-CM | POA: Diagnosis not present

## 2019-04-21 DIAGNOSIS — Z7982 Long term (current) use of aspirin: Secondary | ICD-10-CM | POA: Diagnosis not present

## 2019-04-21 DIAGNOSIS — N184 Chronic kidney disease, stage 4 (severe): Secondary | ICD-10-CM | POA: Diagnosis not present

## 2019-04-21 DIAGNOSIS — R279 Unspecified lack of coordination: Secondary | ICD-10-CM | POA: Diagnosis not present

## 2019-04-21 DIAGNOSIS — I129 Hypertensive chronic kidney disease with stage 1 through stage 4 chronic kidney disease, or unspecified chronic kidney disease: Secondary | ICD-10-CM | POA: Insufficient documentation

## 2019-04-21 LAB — CBC WITH DIFFERENTIAL/PLATELET
Abs Immature Granulocytes: 0.04 10*3/uL (ref 0.00–0.07)
Basophils Absolute: 0 10*3/uL (ref 0.0–0.1)
Basophils Relative: 0 %
Eosinophils Absolute: 0 10*3/uL (ref 0.0–0.5)
Eosinophils Relative: 0 %
HCT: 37.3 % (ref 36.0–46.0)
Hemoglobin: 11.5 g/dL — ABNORMAL LOW (ref 12.0–15.0)
Immature Granulocytes: 1 %
Lymphocytes Relative: 10 %
Lymphs Abs: 0.5 10*3/uL — ABNORMAL LOW (ref 0.7–4.0)
MCH: 23.8 pg — ABNORMAL LOW (ref 26.0–34.0)
MCHC: 30.8 g/dL (ref 30.0–36.0)
MCV: 77.2 fL — ABNORMAL LOW (ref 80.0–100.0)
Monocytes Absolute: 0.4 10*3/uL (ref 0.1–1.0)
Monocytes Relative: 9 %
Neutro Abs: 3.9 10*3/uL (ref 1.7–7.7)
Neutrophils Relative %: 80 %
Platelets: 200 10*3/uL (ref 150–400)
RBC: 4.83 MIL/uL (ref 3.87–5.11)
RDW: 23.2 % — ABNORMAL HIGH (ref 11.5–15.5)
Smear Review: NORMAL
WBC: 4.8 10*3/uL (ref 4.0–10.5)
nRBC: 0 % (ref 0.0–0.2)

## 2019-04-21 LAB — URINALYSIS, COMPLETE (UACMP) WITH MICROSCOPIC
Bacteria, UA: NONE SEEN
Bilirubin Urine: NEGATIVE
Glucose, UA: NEGATIVE mg/dL
Hgb urine dipstick: NEGATIVE
Ketones, ur: NEGATIVE mg/dL
Leukocytes,Ua: NEGATIVE
Nitrite: NEGATIVE
Protein, ur: NEGATIVE mg/dL
Specific Gravity, Urine: 1.015 (ref 1.005–1.030)
Squamous Epithelial / HPF: NONE SEEN (ref 0–5)
pH: 7 (ref 5.0–8.0)

## 2019-04-21 LAB — COMPREHENSIVE METABOLIC PANEL
ALT: 16 U/L (ref 0–44)
AST: 36 U/L (ref 15–41)
Albumin: 3 g/dL — ABNORMAL LOW (ref 3.5–5.0)
Alkaline Phosphatase: 38 U/L (ref 38–126)
Anion gap: 8 (ref 5–15)
BUN: 36 mg/dL — ABNORMAL HIGH (ref 8–23)
CO2: 21 mmol/L — ABNORMAL LOW (ref 22–32)
Calcium: 8.8 mg/dL — ABNORMAL LOW (ref 8.9–10.3)
Chloride: 112 mmol/L — ABNORMAL HIGH (ref 98–111)
Creatinine, Ser: 1.92 mg/dL — ABNORMAL HIGH (ref 0.44–1.00)
GFR calc Af Amer: 26 mL/min — ABNORMAL LOW (ref >60)
GFR calc non Af Amer: 22 mL/min — ABNORMAL LOW (ref >60)
Glucose, Bld: 101 mg/dL — ABNORMAL HIGH (ref 70–99)
Potassium: 5.6 mmol/L — ABNORMAL HIGH (ref 3.5–5.1)
Sodium: 141 mmol/L (ref 135–145)
Total Bilirubin: 0.5 mg/dL (ref 0.3–1.2)
Total Protein: 6.6 g/dL (ref 6.5–8.1)

## 2019-04-21 NOTE — ED Notes (Signed)
Called [facility] for transport to [facility]
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
## 2019-04-21 NOTE — ED Triage Notes (Addendum)
Pt arrives [facility] from [facility] for low BP reading today. Recently seen for same and dx with COVID. On room air. VSS with EMS. Pt denies pain, denies SOB, denies vomiting. EMS reports "a little diarrhea". EMS reports that pt hasn't been eating/drinking as much. CBG 150 with EMS.

## 2019-04-21 NOTE — ED Notes (Signed)
Pt incontinent of urine - cleaned and straight cathed with new depends applied

## 2019-04-21 NOTE — ED Notes (Signed)
Pt stated she needed to urinate. Attempted to place pt on bedpan but no urinated. Pt had small amount of stool to brief, cleaned with wipes. Pt able to turn self on bed to assist.

## 2019-04-21 NOTE — Discharge Instructions (Signed)
No hypotension was noted during the ER visit today.  Lab studies are all at or near patient's baseline.  Urinalysis does not show any sign of infection.  Have the patient see her primary care provider for symptoms of concern.

## 2019-04-21 NOTE — ED Notes (Signed)
Report to [facility] - ems on scene for transport

## 2019-04-24 DIAGNOSIS — E861 Hypovolemia: Secondary | ICD-10-CM | POA: Diagnosis not present

## 2019-04-24 DIAGNOSIS — I10 Essential (primary) hypertension: Secondary | ICD-10-CM | POA: Diagnosis not present

## 2019-04-24 DIAGNOSIS — I9589 Other hypotension: Secondary | ICD-10-CM | POA: Diagnosis not present

## 2019-04-24 NOTE — Telephone Encounter (Signed)
Unable to reach patient. No answer on Monday or today.

## 2019-04-25 ENCOUNTER — Other Ambulatory Visit: Payer: Self-pay

## 2019-04-25 NOTE — Patient Outreach (Signed)
[facility]) Care Management  04/25/2019  Julia Herrera [birth year] [id number]   Medication Adherence call to Julia Herrera Telephone call to Patient regarding Medication Adherence unable to reach patient. Julia Herrera is showing due on Atorvastatin 20 mg and Valsartan 160 mg under [place].  [facility] Management Direct Dial [phone]  Fax [phone] [email or web address]

## 2019-05-02 ENCOUNTER — Other Ambulatory Visit: Payer: Self-pay

## 2019-05-02 ENCOUNTER — Encounter: Payer: Self-pay | Admitting: Emergency Medicine

## 2019-05-02 DIAGNOSIS — R404 Transient alteration of awareness: Secondary | ICD-10-CM | POA: Diagnosis not present

## 2019-05-02 DIAGNOSIS — Z9119 Patient's noncompliance with other medical treatment and regimen: Secondary | ICD-10-CM | POA: Insufficient documentation

## 2019-05-02 DIAGNOSIS — F0391 Unspecified dementia with behavioral disturbance: Secondary | ICD-10-CM | POA: Insufficient documentation

## 2019-05-02 DIAGNOSIS — N184 Chronic kidney disease, stage 4 (severe): Secondary | ICD-10-CM | POA: Insufficient documentation

## 2019-05-02 DIAGNOSIS — I129 Hypertensive chronic kidney disease with stage 1 through stage 4 chronic kidney disease, or unspecified chronic kidney disease: Secondary | ICD-10-CM | POA: Insufficient documentation

## 2019-05-02 DIAGNOSIS — Z743 Need for continuous supervision: Secondary | ICD-10-CM | POA: Diagnosis not present

## 2019-05-03 DIAGNOSIS — Z79899 Other long term (current) drug therapy: Secondary | ICD-10-CM | POA: Diagnosis not present

## 2019-05-07 ENCOUNTER — Encounter: Payer: Self-pay | Admitting: Internal Medicine

## 2019-05-07 ENCOUNTER — Other Ambulatory Visit: Payer: Self-pay

## 2019-05-07 ENCOUNTER — Inpatient Hospital Stay
Admission: EM | Admit: 2019-05-07 | Discharge: 2019-05-14 | DRG: 871 | Disposition: E | Payer: Medicare Other | Attending: Internal Medicine | Admitting: Internal Medicine

## 2019-05-07 ENCOUNTER — Emergency Department: Payer: Medicare Other

## 2019-05-07 DIAGNOSIS — Z515 Encounter for palliative care: Secondary | ICD-10-CM | POA: Diagnosis present

## 2019-05-07 DIAGNOSIS — R579 Shock, unspecified: Secondary | ICD-10-CM

## 2019-05-07 DIAGNOSIS — J189 Pneumonia, unspecified organism: Secondary | ICD-10-CM

## 2019-05-07 DIAGNOSIS — J1282 Pneumonia due to coronavirus disease 2019: Secondary | ICD-10-CM | POA: Diagnosis present

## 2019-05-07 DIAGNOSIS — N1832 Chronic kidney disease, stage 3b: Secondary | ICD-10-CM | POA: Diagnosis not present

## 2019-05-07 DIAGNOSIS — F0390 Unspecified dementia without behavioral disturbance: Secondary | ICD-10-CM | POA: Diagnosis present

## 2019-05-07 DIAGNOSIS — A4189 Other specified sepsis: Secondary | ICD-10-CM | POA: Diagnosis not present

## 2019-05-07 DIAGNOSIS — R0689 Other abnormalities of breathing: Secondary | ICD-10-CM | POA: Diagnosis not present

## 2019-05-07 DIAGNOSIS — F17220 Nicotine dependence, chewing tobacco, uncomplicated: Secondary | ICD-10-CM | POA: Diagnosis present

## 2019-05-07 DIAGNOSIS — R64 Cachexia: Secondary | ICD-10-CM | POA: Diagnosis present

## 2019-05-07 DIAGNOSIS — E875 Hyperkalemia: Secondary | ICD-10-CM | POA: Diagnosis present

## 2019-05-07 DIAGNOSIS — E870 Hyperosmolality and hypernatremia: Secondary | ICD-10-CM | POA: Diagnosis present

## 2019-05-07 DIAGNOSIS — N184 Chronic kidney disease, stage 4 (severe): Secondary | ICD-10-CM | POA: Diagnosis not present

## 2019-05-07 DIAGNOSIS — Z66 Do not resuscitate: Secondary | ICD-10-CM | POA: Diagnosis not present

## 2019-05-07 DIAGNOSIS — U071 COVID-19: Secondary | ICD-10-CM | POA: Diagnosis not present

## 2019-05-07 DIAGNOSIS — R6521 Severe sepsis with septic shock: Secondary | ICD-10-CM | POA: Diagnosis present

## 2019-05-07 DIAGNOSIS — Z7982 Long term (current) use of aspirin: Secondary | ICD-10-CM | POA: Diagnosis not present

## 2019-05-07 DIAGNOSIS — Z7189 Other specified counseling: Secondary | ICD-10-CM | POA: Diagnosis not present

## 2019-05-07 DIAGNOSIS — A419 Sepsis, unspecified organism: Secondary | ICD-10-CM | POA: Diagnosis not present

## 2019-05-07 DIAGNOSIS — I129 Hypertensive chronic kidney disease with stage 1 through stage 4 chronic kidney disease, or unspecified chronic kidney disease: Secondary | ICD-10-CM | POA: Diagnosis present

## 2019-05-07 DIAGNOSIS — R9389 Abnormal findings on diagnostic imaging of other specified body structures: Secondary | ICD-10-CM | POA: Diagnosis not present

## 2019-05-07 DIAGNOSIS — M109 Gout, unspecified: Secondary | ICD-10-CM | POA: Diagnosis not present

## 2019-05-07 DIAGNOSIS — Z79899 Other long term (current) drug therapy: Secondary | ICD-10-CM

## 2019-05-07 DIAGNOSIS — I10 Essential (primary) hypertension: Secondary | ICD-10-CM | POA: Diagnosis not present

## 2019-05-07 DIAGNOSIS — R6520 Severe sepsis without septic shock: Secondary | ICD-10-CM

## 2019-05-07 DIAGNOSIS — Z6826 Body mass index (BMI) 26.0-26.9, adult: Secondary | ICD-10-CM

## 2019-05-07 DIAGNOSIS — N179 Acute kidney failure, unspecified: Secondary | ICD-10-CM | POA: Diagnosis not present

## 2019-05-07 DIAGNOSIS — E785 Hyperlipidemia, unspecified: Secondary | ICD-10-CM | POA: Diagnosis not present

## 2019-05-07 DIAGNOSIS — E872 Acidosis: Secondary | ICD-10-CM | POA: Diagnosis present

## 2019-05-07 DIAGNOSIS — R404 Transient alteration of awareness: Secondary | ICD-10-CM | POA: Diagnosis not present

## 2019-05-07 DIAGNOSIS — R4020 Unspecified coma: Secondary | ICD-10-CM | POA: Diagnosis not present

## 2019-05-07 DIAGNOSIS — N189 Chronic kidney disease, unspecified: Secondary | ICD-10-CM | POA: Diagnosis not present

## 2019-05-07 DIAGNOSIS — M810 Age-related osteoporosis without current pathological fracture: Secondary | ICD-10-CM | POA: Diagnosis present

## 2019-05-07 DIAGNOSIS — R0602 Shortness of breath: Secondary | ICD-10-CM | POA: Diagnosis not present

## 2019-05-07 DIAGNOSIS — G9341 Metabolic encephalopathy: Secondary | ICD-10-CM

## 2019-05-07 DIAGNOSIS — Z743 Need for continuous supervision: Secondary | ICD-10-CM | POA: Diagnosis not present

## 2019-05-07 LAB — URINALYSIS, ROUTINE W REFLEX MICROSCOPIC
Bilirubin Urine: NEGATIVE
Glucose, UA: 50 mg/dL — AB
Ketones, ur: 5 mg/dL — AB
Leukocytes,Ua: NEGATIVE
Nitrite: NEGATIVE
Protein, ur: 30 mg/dL — AB
Specific Gravity, Urine: 1.016 (ref 1.005–1.030)
pH: 5 (ref 5.0–8.0)

## 2019-05-07 LAB — COMPREHENSIVE METABOLIC PANEL
ALT: 19 U/L (ref 0–44)
AST: 27 U/L (ref 15–41)
Albumin: 1.9 g/dL — ABNORMAL LOW (ref 3.5–5.0)
Alkaline Phosphatase: 48 U/L (ref 38–126)
BUN: 130 mg/dL — ABNORMAL HIGH (ref 8–23)
CO2: 11 mmol/L — ABNORMAL LOW (ref 22–32)
Calcium: 7.7 mg/dL — ABNORMAL LOW (ref 8.9–10.3)
Chloride: >130 mmol/L (ref 98–111)
Creatinine, Ser: 5.38 mg/dL — ABNORMAL HIGH (ref 0.44–1.00)
GFR calc Af Amer: 7 mL/min — ABNORMAL LOW (ref >60)
GFR calc non Af Amer: 6 mL/min — ABNORMAL LOW (ref >60)
Glucose, Bld: 104 mg/dL — ABNORMAL HIGH (ref 70–99)
Potassium: >7.5 mmol/L (ref 3.5–5.1)
Sodium: 164 mmol/L (ref 135–145)
Total Bilirubin: 0.7 mg/dL (ref 0.3–1.2)
Total Protein: 4.9 g/dL — ABNORMAL LOW (ref 6.5–8.1)

## 2019-05-07 LAB — CBC WITH DIFFERENTIAL/PLATELET
Abs Immature Granulocytes: 0.43 10*3/uL — ABNORMAL HIGH (ref 0.00–0.07)
Basophils Absolute: 0.1 10*3/uL (ref 0.0–0.1)
Basophils Relative: 1 %
Eosinophils Absolute: 0 10*3/uL (ref 0.0–0.5)
Eosinophils Relative: 0 %
HCT: 46.1 % — ABNORMAL HIGH (ref 36.0–46.0)
Hemoglobin: 13.1 g/dL (ref 12.0–15.0)
Immature Granulocytes: 3 %
Lymphocytes Relative: 4 %
Lymphs Abs: 0.7 10*3/uL (ref 0.7–4.0)
MCH: 24 pg — ABNORMAL LOW (ref 26.0–34.0)
MCHC: 28.4 g/dL — ABNORMAL LOW (ref 30.0–36.0)
MCV: 84.6 fL (ref 80.0–100.0)
Monocytes Absolute: 0.9 10*3/uL (ref 0.1–1.0)
Monocytes Relative: 6 %
Neutro Abs: 14.1 10*3/uL — ABNORMAL HIGH (ref 1.7–7.7)
Neutrophils Relative %: 86 %
Platelets: 367 10*3/uL (ref 150–400)
RBC: 5.45 MIL/uL — ABNORMAL HIGH (ref 3.87–5.11)
RDW: 27.5 % — ABNORMAL HIGH (ref 11.5–15.5)
Smear Review: NORMAL
WBC: 16.1 10*3/uL — ABNORMAL HIGH (ref 4.0–10.5)
nRBC: 0.2 % (ref 0.0–0.2)

## 2019-05-07 LAB — LACTIC ACID, PLASMA
Lactic Acid, Venous: 4.5 mmol/L (ref 0.5–1.9)
Lactic Acid, Venous: 3.5 mmol/L (ref 0.5–1.9)

## 2019-05-07 LAB — PATHOLOGIST SMEAR REVIEW

## 2019-05-07 LAB — RESPIRATORY PANEL BY RT PCR (FLU A&B, COVID)
Influenza A by PCR: NEGATIVE
Influenza B by PCR: NEGATIVE
SARS Coronavirus 2 by RT PCR: POSITIVE — AB

## 2019-05-07 LAB — GLUCOSE, CAPILLARY
Glucose-Capillary: 95 mg/dL (ref 70–99)
Glucose-Capillary: 198 mg/dL — ABNORMAL HIGH (ref 70–99)

## 2019-05-07 LAB — PROTIME-INR
INR: 2 — ABNORMAL HIGH (ref 0.8–1.2)
Prothrombin Time: 22.6 seconds — ABNORMAL HIGH (ref 11.4–15.2)

## 2019-05-07 LAB — CK: Total CK: 252 U/L — ABNORMAL HIGH (ref 38–234)

## 2019-05-07 LAB — LIPASE, BLOOD: Lipase: 953 U/L — ABNORMAL HIGH (ref 11–51)

## 2019-05-07 LAB — VALPROIC ACID LEVEL: Valproic Acid Lvl: 42 ug/mL — ABNORMAL LOW (ref 50.0–100.0)

## 2019-05-07 MED ORDER — SODIUM CHLORIDE 0.9 % IV SOLN
1.0000 g | INTRAVENOUS | Status: DC
  Filled 2019-05-07: qty 1

## 2019-05-07 MED ORDER — ACETAMINOPHEN 325 MG PO TABS: 650.0000 mg | ORAL_TABLET | Freq: Four times a day (QID) | ORAL | Status: DC | PRN

## 2019-05-07 MED ORDER — SODIUM CHLORIDE 0.9 % IV SOLN: INTRAVENOUS | Status: DC

## 2019-05-07 MED ORDER — SODIUM CHLORIDE 0.9 % IV BOLUS
500.0000 mL | Freq: Once | INTRAVENOUS | Status: AC
  Administered 2019-05-07: 18:00:00 500 mL via INTRAVENOUS

## 2019-05-07 MED ORDER — SODIUM CHLORIDE 0.9 % IV SOLN: 1.0000 g | INTRAVENOUS | Status: DC

## 2019-05-07 MED ORDER — DEXAMETHASONE SODIUM PHOSPHATE 10 MG/ML IJ SOLN
6.0000 mg | INTRAMUSCULAR | Status: DC
  Filled 2019-05-07: qty 0.6

## 2019-05-07 MED ORDER — DEXTROSE 50 % IV SOLN
2.0000 | Freq: Once | INTRAVENOUS | Status: AC
  Administered 2019-05-07: 18:00:00 100 mL via INTRAVENOUS
  Filled 2019-05-07: qty 100

## 2019-05-07 MED ORDER — NOREPINEPHRINE 4 MG/250ML-% IV SOLN
2.0000 ug/min | INTRAVENOUS | Status: DC
  Administered 2019-05-07: 16:00:00 4 ug/min via INTRAVENOUS
  Administered 2019-05-07: 20:00:00 2 ug/min via INTRAVENOUS
  Filled 2019-05-07 (×2): qty 250

## 2019-05-07 MED ORDER — VANCOMYCIN VARIABLE DOSE PER UNSTABLE RENAL FUNCTION (PHARMACIST DOSING): Status: DC

## 2019-05-07 MED ORDER — MORPHINE SULFATE (PF) 2 MG/ML IV SOLN
2.0000 mg | INTRAVENOUS | Status: DC | PRN
  Administered 2019-05-08 (×2): 2 mg via INTRAVENOUS
  Filled 2019-05-07 (×2): qty 1

## 2019-05-07 MED ORDER — SODIUM CHLORIDE 0.9 % IV SOLN
100.0000 mg | Freq: Every day | INTRAVENOUS | Status: DC
  Filled 2019-05-07: qty 20

## 2019-05-07 MED ORDER — SODIUM CHLORIDE 0.9 % IV BOLUS
1000.0000 mL | Freq: Once | INTRAVENOUS | Status: AC
  Administered 2019-05-07: 13:00:00 1000 mL via INTRAVENOUS

## 2019-05-07 MED ORDER — SODIUM CHLORIDE 0.9 % IV BOLUS
500.0000 mL | Freq: Once | INTRAVENOUS | Status: AC
  Administered 2019-05-07: 500 mL via INTRAVENOUS

## 2019-05-07 MED ORDER — SODIUM BICARBONATE 8.4 % IV SOLN
INTRAVENOUS | Status: DC
  Filled 2019-05-07 (×2): qty 100

## 2019-05-07 MED ORDER — CALCIUM GLUCONATE 10 % IV SOLN
1.0000 g | Freq: Once | INTRAVENOUS | Status: DC
  Filled 2019-05-07: qty 10

## 2019-05-07 MED ORDER — SODIUM CHLORIDE 0.9 % IV SOLN
200.0000 mg | Freq: Once | INTRAVENOUS | Status: AC
  Administered 2019-05-07: 18:00:00 200 mg via INTRAVENOUS
  Filled 2019-05-07: qty 200

## 2019-05-07 MED ORDER — MORPHINE SULFATE (CONCENTRATE) 10 MG/0.5ML PO SOLN
5.0000 mg | ORAL | Status: DC | PRN
  Filled 2019-05-07: qty 0.5

## 2019-05-07 MED ORDER — CALCIUM GLUCONATE-NACL 1-0.675 GM/50ML-% IV SOLN
1.0000 g | Freq: Once | INTRAVENOUS | Status: AC
  Administered 2019-05-07: 18:00:00 1000 mg via INTRAVENOUS
  Filled 2019-05-07: qty 50

## 2019-05-07 MED ORDER — INSULIN ASPART 100 UNIT/ML IV SOLN
10.0000 [IU] | Freq: Once | INTRAVENOUS | Status: AC
  Administered 2019-05-07: 18:00:00 10 [IU] via INTRAVENOUS
  Filled 2019-05-07: qty 0.1

## 2019-05-07 MED ORDER — ONDANSETRON HCL 4 MG/2ML IJ SOLN: 4.0000 mg | Freq: Four times a day (QID) | INTRAMUSCULAR | Status: DC | PRN

## 2019-05-07 MED ORDER — DEXAMETHASONE SODIUM PHOSPHATE 10 MG/ML IJ SOLN
10.0000 mg | INTRAMUSCULAR | Status: AC
  Administered 2019-05-07: 16:00:00 10 mg via INTRAVENOUS
  Filled 2019-05-07: qty 1

## 2019-05-07 MED ORDER — SODIUM CHLORIDE 0.9 % IV SOLN
250.0000 mL | INTRAVENOUS | Status: DC
  Administered 2019-05-07: 16:00:00 250 mL via INTRAVENOUS

## 2019-05-07 MED ORDER — SODIUM BICARBONATE 8.4 % IV SOLN
50.0000 meq | Freq: Once | INTRAVENOUS | Status: AC
  Administered 2019-05-07: 18:00:00 50 meq via INTRAVENOUS
  Filled 2019-05-07: qty 50

## 2019-05-07 MED ORDER — VANCOMYCIN HCL 1250 MG/250ML IV SOLN
1250.0000 mg | Freq: Once | INTRAVENOUS | Status: AC
  Administered 2019-05-07: 14:00:00 1250 mg via INTRAVENOUS
  Filled 2019-05-07: qty 250

## 2019-05-07 MED ORDER — SODIUM BICARBONATE 8.4 % IV SOLN: 50.0000 meq | Freq: Once | INTRAVENOUS | Status: DC

## 2019-05-07 MED ORDER — ACETAMINOPHEN 650 MG RE SUPP: 650.0000 mg | Freq: Four times a day (QID) | RECTAL | Status: DC | PRN

## 2019-05-07 MED ORDER — SODIUM CHLORIDE 0.9 % IV BOLUS
1000.0000 mL | Freq: Once | INTRAVENOUS | Status: AC
  Administered 2019-05-07: 12:00:00 1000 mL via INTRAVENOUS

## 2019-05-07 MED ORDER — PIPERACILLIN-TAZOBACTAM 3.375 G IVPB
3.3750 g | Freq: Once | INTRAVENOUS | Status: AC
  Administered 2019-05-07: 13:00:00 3.375 g via INTRAVENOUS
  Filled 2019-05-07: qty 50

## 2019-05-07 MED ORDER — HEPARIN SODIUM (PORCINE) 5000 UNIT/ML IJ SOLN: 5000.0000 [IU] | Freq: Three times a day (TID) | INTRAMUSCULAR | Status: DC

## 2019-05-07 MED ORDER — ONDANSETRON HCL 4 MG PO TABS: 4.0000 mg | ORAL_TABLET | Freq: Four times a day (QID) | ORAL | Status: DC | PRN

## 2019-05-07 MED ORDER — SODIUM BICARBONATE 8.4 % IV SOLN
INTRAVENOUS | Status: DC
  Filled 2019-05-07 (×3): qty 150

## 2019-05-07 MED ORDER — ALBUTEROL SULFATE (2.5 MG/3ML) 0.083% IN NEBU: 10.0000 mg | INHALATION_SOLUTION | Freq: Once | RESPIRATORY_TRACT | Status: DC

## 2019-05-07 NOTE — ED Provider Notes (Addendum)
Procedures    ----------------------------------------- 3:19 PM on 04/24/2019 -----------------------------------------  Pt is being admitted to hospitalist. I am assuming care of ED patients from Dr. Jacqualine Code.  Pt has had 2ml/kg IVF bolus due to septic shock.  Last BP at 3:17pm had MAP of 60. Will start Levophed through PIV which is best option for now given DNR status and likely low dose requirement of levophed to maintain adequate perfusion until shock state improves.  Sepsis reassessment completed.   Carrie Mew, MD 05/03/2019 1522    ----------------------------------------- 5:40 PM on 05/01/2019 ----------------------------------------- Notified of multiple severe lab abnormalities including sodium of 164, potassium greater than 7.5, chloride greater than 130.  Creatinine of 5.3.  These electrolyte abnormalities in the setting of acute renal failure may be worsening her cardiovascular function and hypotension.  Palliative care has seen the patient but no indication yet if the patient will be transition to comfort care, so I will give calcium, bicarb, insulin, glucose, albuterol to attempt to improve potassium balance and physiologic function.  She is DNR, as noted by Dr. Jacqualine Code and Dr. Leslye Peer.    Carrie Mew, MD 05/10/19 1537

## 2019-05-07 NOTE — ED Notes (Signed)
ED Provider at bedside. 

## 2019-05-07 NOTE — ED Notes (Signed)
Bair Hugger placed on patient at this time. 

## 2019-05-07 NOTE — ED Notes (Signed)
Unable to obtain blood cultures due to difficult stick with blood return.
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
## 2019-05-07 NOTE — ED Notes (Signed)
Dr Joni Fears aware of lab results.  Message sent to dr wieting.

## 2019-05-07 NOTE — ED Notes (Signed)
Remains tachypneic and hypotensive. Dr. Jacqualine Code alerted.  Continue to monitor.

## 2019-05-07 NOTE — Consult Note (Signed)
Reason for Consult: Septic Shock, Vasopressor requirements Referring Physician: Hospitalist service  HPI: Julia Herrera is a 86 y.o. female w/ a past medical history of dementia, CKD, gout, HLD, hypertension and osteoporosis who resides in a nursing home and presented to the ED today for lethargy. She reportedly tested positive for COVID back on 04/18/2018 and repeat COVID test today is positive. Per EMS, she has been having decreased ability to eat and drink. Her vitals on arrival to ED were as follows: 85/67, RR 28, HR 73, Temp 93 and oxygen saturation 93% on 2L [place]. Her ED workup revealed a lactate 4.5, WBC 16.1, Na 164, K 7.5, Cl 130. Chest xray showed worsening multifocal pneumonia. Sespis protocol was initiated. Patient received 2.5L of crystalloid, abxs and temporizing medications for hyperkalemia. She was initiated on a Levophed gtt. The patient was made a DNR by primary team after discussion w/family. She was transferred to the ICU for continued management and critical care was consulted for evaluation due to need for vasopressor theraypy.   Patient seen and examined in the ICU. Patient is obtunded, hypotensive w/systolics in the [id number], tachypneic in the 30s and oxygen saturation 93% on RA. Levophed gtt was being started upon entering room.   Past Medical History:  Diagnosis Date  . Chronic kidney disease   . Dementia ([place])   . Gout   . Hyperlipidemia   . Hypertension   . Osteoporosis     Past Surgical History:  Procedure Laterality Date  . CHOLECYSTECTOMY      Family History  Problem Relation Age of Onset  . Alcohol abuse Mother   . Heart attack Father   . Heart disease Brother     Social History:  reports that she has never smoked. Her smokeless tobacco use includes snuff. She reports that she does not drink alcohol or use drugs.  Allergies:  Allergies  Allergen Reactions  . Allopurinol Other (See Comments)    dizziness  . Fosamax [Alendronate] Nausea Only  . Niaspan [Niacin  Er] Other (See Comments)    unknown  . Zocor [Simvastatin] Other (See Comments)    Muscle pain    Medications: I have reviewed the patient's current medications.  Results for orders placed or performed during the hospital encounter of 04/27/2019 (from the past 48 hour(s))  Lactic acid, plasma     Status: Abnormal   Collection Time: 05/12/2019 11:35 AM  Result Value Ref Range   Lactic Acid, Venous 4.5 (HH) 0.5 - 1.9 mmol/L    Comment: CRITICAL RESULT CALLED TO, READ BACK BY AND VERIFIED WITH ASHLEY Brau ON 05/09/2019 AT 1233 BY JAG Performed at [facility], [address] [place], [place] [address]   Respiratory Panel by RT PCR (Flu A&B, Covid) - Nasopharyngeal Swab     Status: Abnormal   Collection Time: 05/13/2019 12:03 PM   Specimen: Nasopharyngeal Swab  Result Value Ref Range   SARS Coronavirus 2 by RT PCR POSITIVE (A) NEGATIVE    Comment: RESULT CALLED TO, READ BACK BY AND VERIFIED WITH: AMY COYNE AT [id number] 05/02/2019  TFK (NOTE) SARS-CoV-2 target nucleic acids are DETECTED. SARS-CoV-2 RNA is generally detectable in upper respiratory specimens  during the acute phase of infection. Positive results are indicative of the presence of the identified virus, but do not rule out bacterial infection or co-infection with other pathogens not detected by the test. Clinical correlation with patient history and other diagnostic information is necessary to determine patient infection status. The expected result  is Negative. Fact Sheet for Patients:  PinkCheek.be Fact Sheet for Healthcare Providers: GravelBags.it This test is not yet approved or cleared by the [place] FDA and  has been authorized for detection and/or diagnosis of SARS-CoV-2 by FDA under an Emergency Use Authorization (EUA).  This EUA will remain in effect (meaning this test can be used) for  the duration of  the COVID-19 declaration under Section 564(b)(1)  of the Act, 21 U.S.C. section 360bbb-3(b)(1), unless the authorization is terminated or revoked sooner.    Influenza A by PCR NEGATIVE NEGATIVE   Influenza B by PCR NEGATIVE NEGATIVE    Comment: (NOTE) The Xpert Xpress SARS-CoV-2/FLU/RSV assay is intended as an aid in  the diagnosis of influenza from Nasopharyngeal swab specimens and  should not be used as a sole basis for treatment. Nasal washings and  aspirates are unacceptable for Xpert Xpress SARS-CoV-2/FLU/RSV  testing. Fact Sheet for Patients: PinkCheek.be Fact Sheet for Healthcare Providers: GravelBags.it This test is not yet approved or cleared by the [place] FDA and  has been authorized for detection and/or diagnosis of SARS-CoV-2 by  FDA under an Emergency Use Authorization (EUA). This EUA will remain  in effect (meaning this test can be used) for the duration of the  Covid-19 declaration under Section 564(b)(1) of the Act, 21  U.S.C. section 360bbb-3(b)(1), unless the authorization is  terminated or revoked. Performed at [facility], [address] [place], [place] [address]   Urinalysis, Routine w reflex microscopic     Status: Abnormal   Collection Time: 04/23/2019 12:16 PM  Result Value Ref Range   Color, Urine YELLOW (A) YELLOW   APPearance CLOUDY (A) CLEAR   Specific Gravity, Urine 1.016 1.005 - 1.030   pH 5.0 5.0 - 8.0   Glucose, UA 50 (A) NEGATIVE mg/dL   Hgb urine dipstick SMALL (A) NEGATIVE   Bilirubin Urine NEGATIVE NEGATIVE   Ketones, ur 5 (A) NEGATIVE mg/dL   Protein, ur 30 (A) NEGATIVE mg/dL   Nitrite NEGATIVE NEGATIVE   Leukocytes,Ua NEGATIVE NEGATIVE   RBC / HPF 6-10 0 - 5 RBC/hpf   WBC, UA 21-50 0 - 5 WBC/hpf   Bacteria, UA RARE (A) NONE SEEN   Squamous Epithelial / LPF 0-5 0 - 5   WBC Clumps PRESENT    Mucus PRESENT    Hyaline Casts, UA PRESENT     Comment: Performed at [facility], [address]  [place], [place] [address]  Glucose, capillary     Status: None   Collection Time: 04/21/2019 12:18 PM  Result Value Ref Range   Glucose-Capillary 95 70 - 99 mg/dL  CBC WITH DIFFERENTIAL     Status: Abnormal   Collection Time: 04/20/2019  1:16 PM  Result Value Ref Range   WBC 16.1 (H) 4.0 - 10.5 K/uL   RBC 5.45 (H) 3.87 - 5.11 MIL/uL   Hemoglobin 13.1 12.0 - 15.0 g/dL   HCT 46.1 (H) 36.0 - 46.0 %   MCV 84.6 80.0 - 100.0 fL   MCH 24.0 (L) 26.0 - 34.0 pg   MCHC 28.4 (L) 30.0 - 36.0 g/dL   RDW 27.5 (H) 11.5 - 15.5 %   Platelets 367 150 - 400 K/uL   nRBC 0.2 0.0 - 0.2 %   Neutrophils Relative % 86 %   Neutro Abs 14.1 (H) 1.7 - 7.7 K/uL   Lymphocytes Relative 4 %   Lymphs Abs 0.7 0.7 - 4.0 K/uL   Monocytes Relative 6 %  Monocytes Absolute 0.9 0.1 - 1.0 K/uL   Eosinophils Relative 0 %   Eosinophils Absolute 0.0 0.0 - 0.5 K/uL   Basophils Relative 1 %   Basophils Absolute 0.1 0.0 - 0.1 K/uL   WBC Morphology MORPHOLOGY UNREMARKABLE    Smear Review Normal platelet morphology    Immature Granulocytes 3 %   Abs Immature Granulocytes 0.43 (H) 0.00 - 0.07 K/uL   Acanthocytes PRESENT    Schistocytes PRESENT    Burr Cells PRESENT    Ovalocytes PRESENT     Comment: Performed at [facility], [address], [place], [place] [address]  Protime-INR     Status: Abnormal   Collection Time: 04/18/2019  1:16 PM  Result Value Ref Range   Prothrombin Time 22.6 (H) 11.4 - 15.2 seconds   INR 2.0 (H) 0.8 - 1.2    Comment: (NOTE) INR goal varies based on device and disease states. Performed at [facility], [address], [place], [place] [address]   Pathologist smear review     Status: None   Collection Time: 04/19/2019  1:16 PM  Result Value Ref Range   Path Review Blood smear is reviewed.     Comment:  Anisocytosis and Neutrophil Leukocytosis. Reviewed by Louanne Belton Rodney Cruise, M.D. Performed at [facility], [address] [place], [place] [address]   Lactic acid, plasma      Status: Abnormal   Collection Time: 04/29/2019  1:51 PM  Result Value Ref Range   Lactic Acid, Venous 3.5 (HH) 0.5 - 1.9 mmol/L    Comment: CRITICAL VALUE NOTED. VALUE IS CONSISTENT WITH PREVIOUSLY REPORTED/CALLED VALUE DAS Performed at [facility], [address] [place], [place] [address]   Comprehensive metabolic panel     Status: Abnormal   Collection Time: 04/22/2019  4:05 PM  Result Value Ref Range   Sodium 164 (HH) 135 - 145 mmol/L    Comment: CRITICAL RESULT CALLED TO, READ BACK BY AND VERIFIED WITH: AMY COHEN ON 04/19/2019 AT 1731 BY JAG    Potassium >7.5 (HH) 3.5 - 5.1 mmol/L    Comment: RESULT CONFIRMED BY MANUAL DILUTION / JAG CRITICAL RESULT CALLED TO, READ BACK BY AND VERIFIED WITH: AMY COHEN ON 05/06/2019 AT 1731 BY JAG    Chloride >130 (HH) 98 - 111 mmol/L    Comment: CRITICAL RESULT CALLED TO, READ BACK BY AND VERIFIED WITH: AMY COHEN ON 04/20/2019 AT 1731 BY JAG    CO2 11 (L) 22 - 32 mmol/L   Glucose, Bld 104 (H) 70 - 99 mg/dL   BUN 130 (H) 8 - 23 mg/dL    Comment: RESULT CONFIRMED BY MANUAL DILUTION / JAG   Creatinine, Ser 5.38 (H) 0.44 - 1.00 mg/dL   Calcium 7.7 (L) 8.9 - 10.3 mg/dL   Total Protein 4.9 (L) 6.5 - 8.1 g/dL   Albumin 1.9 (L) 3.5 - 5.0 g/dL   AST 27 15 - 41 U/L   ALT 19 0 - 44 U/L   Alkaline Phosphatase 48 38 - 126 U/L   Total Bilirubin 0.7 0.3 - 1.2 mg/dL   GFR calc non Af Amer 6 (L) >60 mL/min   GFR calc Af Amer 7 (L) >60 mL/min   Anion gap NOT CALCULATED 5 - 15    Comment: Performed at [facility], [address] [place], [place] [address]  Lipase, blood     Status: Abnormal   Collection Time: 04/21/2019  4:05 PM  Result Value Ref Range  Lipase 953 (H) 11 - 51 U/L    Comment: CONFIRMED BY MANUAL DILUTION AKT Performed at [facility], [address] [place], [place] [address]   Valproic acid level     Status: Abnormal   Collection Time: 05/02/2019  4:05 PM  Result Value Ref Range   Valproic Acid Lvl 42 (L)  50.0 - 100.0 ug/mL    Comment: Performed at [facility], [address] [place], [place] [address]  CK     Status: Abnormal   Collection Time: 05/06/2019  4:05 PM  Result Value Ref Range   Total CK 252 (H) 38 - 234 U/L    Comment: Performed at [facility], [address] [place], [place] [address]  Glucose, capillary     Status: Abnormal   Collection Time: 04/23/2019  7:41 PM  Result Value Ref Range   Glucose-Capillary 198 (H) 70 - 99 mg/dL    DG Chest Port 1 View  Result Date: 05/06/2019 CLINICAL DATA:  Shortness of breath.  COVID positive. EXAM: PORTABLE CHEST 1 VIEW COMPARISON:  Chest x-ray dated April 19, 2019. FINDINGS: Normal heart size. Suspected new small pneumomediastinum. Bilateral peripheral predominant patchy opacities again noted, worsened in the right upper lobe and at the left lung base. No pleural effusion or pneumothorax. No acute osseous abnormality. IMPRESSION: 1. Worsening multifocal pneumonia. 2. Suspected new small pneumomediastinum. Electronically Signed   By: Titus Dubin M.D.   On: 04/20/2019 12:08    Review of Systems  Unable to perform ROS: Patient nonverbal   Blood pressure (!) 88/62, pulse (!) 54, temperature (!) 93 F (33.9 C), temperature source Rectal, resp. rate (!) 27, height 5\' 2"  (1.575 m), weight 66 kg, SpO2 100 %. Physical Exam  General: Moderate distress HEENT: Normocephalic, dry oral mucosa Lungs: Breath sounds coarse and diminished throughout Cardiac: Bilateral +1 LE edema, Normal rate and rhythm, cool to touch Abdomen: Soft, non-tender, bowel sounds present Neuro: Obtunded. Will moan to stimulation but otherwise non-verbal, no eye opening and not following commands   Assessment/Plan:  Septic Shock COVID Pneumonia --Currently on Vancomycin, Cefepime --Remdesivir, Decadron --Cultures pending --Levophed gtt to maintain MAP > 65  Acute on Chronic Kidney Disease Hyperkalemia --K 7.5 --Received 1amp Bicarb,  D50/Insulin, Calcium gluconate  --Bicarb gtt started   Patient w/ an overall poor prognosis and called to discuss w/patients daughter [name]. She was in agreement w/continuing current treatment but in addition to DNR/DNI status also not escalating care. Approx 15 minutes after speaking w/ patients daughter, the patients son [name] came to hospital and I spoke w/both larry and the patients brother. After further discussions, [name] asked that we not continue treatment and make his mother comfortable.   I have updated the hospitalist service. Comfort care orders placed. Critical care will sign off.   Tonye Royalty 05/06/2019, 8:02 PM

## 2019-05-07 NOTE — ED Notes (Signed)
Hospitalist at bedside 

## 2019-05-07 NOTE — ED Notes (Signed)
Lab at bedside

## 2019-05-07 NOTE — Progress Notes (Signed)
Patient ID: Julia Herrera, female   DOB: June 17, 1925, 86 y.o.   MRN: [id number]  Chemistry finally resulted back.  Potassium greater than 7.5.  Creatinine up at 5.38.  Sodium elevated at 164.  Patient unable to take oral meds at this time secondary to altered mental status.  We will give calcium, insulin, D50, sodium bicarb pushes and starting sodium bicarb drip.  Case discussed with nephrology.  Recheck BMP late this evening and again tomorrow morning.  COVID-19 test came back positive earlier and steroids and remdesivir ordered.  CPK not elevated Valproic acid not elevated  When I see patients with a sodium of 164 that indicates a poor prognosis and a palliative care consultation.  Since the patient is on Levophed and has life-threatening hyperkalemia the patient is not stable to transfer to the [place] at this time.  Dr Loletha Grayer

## 2019-05-07 NOTE — H&P (Signed)
[place] [facility] [place] NAME: Julia Herrera    MR#:  [id number]  DATE OF BIRTH:  Nov 04, 1925  DATE OF ADMISSION:  04/22/2019  PRIMARY CARE PHYSICIAN: Steele Sizer, MD   REQUESTING/REFERRING PHYSICIAN: Dr Delman Kitten  CHIEF COMPLAINT:  No chief complaint on file.   HISTORY OF PRESENT ILLNESS:  Julia Herrera  is a 86 y.o. female with a known history of dementia, chronic kidney disease, gout, hyperlipidemia, hypertension and osteoporosis.  Sent in from group home for lethargy.  In the ER she was unable to provide any history or review of systems.  Moaned a little bit when I gave her a sternal rub.  History from old chart and ER physician.  Apparently she was diagnosed with being Covid positive but I do not have a record of this.  Repeat Covid still pending.  Patient coming in hypotensive, hypothermic, elevated white count.  Patient also had a lactic acidosis.  Chemistry still pending at this time.  ER physician spoke with family and made her DO NOT RESUSCITATE.  Hospitalist services were contacted for further evaluation.  PAST MEDICAL HISTORY:   Past Medical History:  Diagnosis Date  . Chronic kidney disease   . Dementia ([place])   . Gout   . Hyperlipidemia   . Hypertension   . Osteoporosis     PAST SURGICAL HISTORY:   Past Surgical History:  Procedure Laterality Date  . CHOLECYSTECTOMY      SOCIAL HISTORY:   Social History   Tobacco Use  . Smoking status: Never Smoker  . Smokeless tobacco: Current User    Types: Snuff  Substance Use Topics  . Alcohol use: No    Alcohol/week: 0.0 standard drinks    FAMILY HISTORY:   Family History  Problem Relation Age of Onset  . Alcohol abuse Mother   . Heart attack Father   . Heart disease Brother     DRUG ALLERGIES:   Allergies  Allergen Reactions  . Allopurinol Other (See Comments)    dizziness  . Fosamax [Alendronate] Nausea Only  . Niaspan [Niacin Er] Other (See Comments)    unknown   . Zocor [Simvastatin] Other (See Comments)    Muscle pain    REVIEW OF SYSTEMS:  Unable to provide review of systems at this time.  Patient is unresponsive to sternal rub  MEDICATIONS AT HOME:   Prior to Admission medications   Medication Sig Start Date End Date Taking? Authorizing Provider  acetaminophen (TYLENOL) 500 MG tablet Take 500 mg by mouth every 6 (six) hours as needed for mild pain or fever.   Yes [provider]  albuterol (PROVENTIL) (2.5 MG/3ML) 0.083% nebulizer solution Take 2.5 mg by nebulization every 6 (six) hours as needed for wheezing or shortness of breath.   Yes [provider]  allopurinol (ZYLOPRIM) 100 MG tablet Take 1 tablet (100 mg total) by mouth 2 (two) times daily. 06/21/18  Yes Sowles, Drue Stager, MD  alum & mag hydroxide-simeth (MI-ACID) 200-200-20 MG/5ML suspension Take 30 mLs by mouth every 6 (six) hours as needed for indigestion or heartburn.   Yes [provider]  aspirin EC 81 MG tablet Take 81 mg by mouth daily.   Yes [provider]  atorvastatin (LIPITOR) 20 MG tablet Take 1 tablet (20 mg total) by mouth at bedtime. 06/21/18  Yes Sowles, Drue Stager, MD  divalproex (DEPAKOTE) 250 MG DR tablet Take 250 mg by mouth 2 (two) times daily.   Yes [provider]  donepezil (ARICEPT) 10 MG tablet Take 10 mg by mouth at bedtime. 03/23/19  Yes [provider]  guaifenesin (ROBITUSSIN) 100 MG/5ML syrup Take 200 mg by mouth every 6 (six) hours as needed for cough.   Yes [provider]  hyoscyamine (LEVSIN SL) 0.125 MG SL tablet Place 0.125 mg under the tongue every 4 (four) hours as needed (secretions).   Yes [provider]  loperamide (IMODIUM) 2 MG capsule Take 2 mg by mouth as needed for diarrhea or loose stools. (max 8 doses per 24 hours)   Yes [provider]  LORazepam (ATIVAN) 0.5 MG tablet Take 0.5 mg by mouth every 4 (four) hours as needed (agitation).   Yes [provider]   magnesium hydroxide (MILK OF MAGNESIA) 400 MG/5ML suspension Take 30 mLs by mouth at bedtime as needed for constipation.   Yes [provider]  Morphine Sulfate (MORPHINE CONCENTRATE) 10 mg / 0.5 ml concentrated solution Take 5 mg by mouth every 4 (four) hours as needed for severe pain.   Yes [provider]  neomycin-bacitracin-polymyxin (NEOSPORIN) ointment Apply 1 application topically as needed for wound care.   Yes [provider]  omeprazole (PRILOSEC) 20 MG capsule TAKE 1 CAPSULE BY MOUTH DAILY Patient taking differently: Take 20 mg by mouth daily.  08/24/18  Yes Sowles, Drue Stager, MD  potassium chloride SA (KLOR-CON) 20 MEQ tablet Take 20 mEq by mouth 2 (two) times daily. 03/16/19  Yes [provider]  risperiDONE (RISPERDAL) 0.5 MG tablet Take 1 tablet (0.5 mg total) by mouth daily. 12/10/18  Yes Patrecia Pour, NP  vitamin B-12 (CYANOCOBALAMIN) 1000 MCG tablet Take 1,000 mcg by mouth daily.   Yes [provider]      VITAL SIGNS:  Blood pressure (!) 96/44, pulse 67, temperature (!) 93 F (33.9 C), temperature source Rectal, resp. rate (!) 34, height 5\' 2"  (1.575 m), weight 66 kg, SpO2 100 %.  PHYSICAL EXAMINATION:  GENERAL:  86 y.o.-year-old patient lying in the bed with no acute distress.  EYES: Pupils pinpoint HEENT: Head atraumatic, normocephalic.  Unable to look into mouth NECK:  Supple, no jugular venous distention. No thyroid enlargement, no tenderness.  LUNGS: Decreased breath sounds bilaterally, no wheezing, rales,rhonchi or crepitation. No use of accessory muscles of respiration.  CARDIOVASCULAR: S1, S2 normal. No murmurs, rubs, or gallops.  ABDOMEN: Soft, nontender, nondistended. Bowel sounds present. No organomegaly or mass.  EXTREMITIES: No pedal edema, cyanosis, or clubbing.  NEUROLOGIC: Patient moans with sternal rub PSYCHIATRIC: Unable to test secondary to unresponsiveness with sternal rub SKIN: No rash, lesion, or ulcer  seen by me anteriorly  LABORATORY PANEL:   CBC Recent Labs  Lab 04/29/2019 1316  WBC 16.1*  HGB 13.1  HCT 46.1*  PLT 367   ------------------------------------------------------------------------------------------------------------------  Chemistries  Still pending for some reason  RADIOLOGY:  DG Chest Port 1 View  Result Date: 04/20/2019 CLINICAL DATA:  Shortness of breath.  COVID positive. EXAM: PORTABLE CHEST 1 VIEW COMPARISON:  Chest x-ray dated April 19, 2019. FINDINGS: Normal heart size. Suspected new small pneumomediastinum. Bilateral peripheral predominant patchy opacities again noted, worsened in the right upper lobe and at the left lung base. No pleural effusion or pneumothorax. No acute osseous abnormality. IMPRESSION: 1. Worsening multifocal pneumonia. 2. Suspected new small pneumomediastinum. Electronically Signed   By: Titus Dubin M.D.   On: 04/16/2019 12:08    EKG:   Sinus rhythm 70 bpm, left bundle branch block  IMPRESSION AND PLAN:  1.  Severe sepsis with hypotension, acute metabolic encephalopathy, hypothermia, leukocytosis, pneumonia.  COVID-19 testing pending.  Start Decadron.  Start vancomycin and cefepime.  Follow-up blood cultures and urine cultures.  Overall prognosis is poor.  We will get palliative care consultation.  ER physician made a DO NOT RESUSCITATE. 2.  History of dementia.  Check a Depakote level 3.  Hyperlipidemia unspecified hold Lipitor.  Check a CPK 4.  The patient is a DO NOT RESUSCITATE 5.  The patient does have chronic kidney disease stage IV.  Waiting for today's chemistry to determine whether this is acute kidney injury on chronic kidney disease.  The patient's prognosis is poor and I do not expect the patient to survive this hospitalization.  We will see how she does with empiric antibiotics and time.  The patient meets inpatient criteria.   All the records are reviewed, laboratory radiological and EKG and case discussed with ED  provider. Management plans discussed with the family and they are in agreement.  CODE STATUS: DO NOT RESUSCITATE  TOTAL TIME TAKING CARE OF THIS PATIENT: 50 minutes.    Loletha Grayer M.D on 05/06/2019 at 3:12 PM  Between 7am to 6pm - Pager - [phone]  After 6pm call admission pager [phone]  [facility]  CC: Primary care physician; Steele Sizer, MD

## 2019-05-07 NOTE — Consult Note (Signed)
PHARMACY -  BRIEF ANTIBIOTIC NOTE   Pharmacy has received consult(s) for sepsis from an ED provider.  The patient's profile has been reviewed for ht/wt/allergies/indication/available labs.    One time order(s) placed for pip/tazo and vancomycin  Further antibiotics/pharmacy consults should be ordered by admitting physician if indicated.                       Thank you, Oswald Hillock 05/09/2019  12:53 PM

## 2019-05-07 NOTE — Progress Notes (Signed)
Spoke with the patient son [name] & brother Quillian Quince regarding the patient's current condition. Patient currently on Levo @ 10, hypothermic with a rectal temp of 31.61F & bradycardic. Pt. do not respond to verbal commands. Discussed patient quality of life and explained Comfort Care to them. They stated they wish for the patient to be Comfort Care. Colletta [place], [place], Charge Nurse notified. Levo stopped at this time. Will continue to monitor patient.

## 2019-05-07 NOTE — Consult Note (Addendum)
CODE SEPSIS - PHARMACY COMMUNICATION  **Broad Spectrum Antibiotics should be administered within 1 hour of Sepsis diagnosis**  Time Code Sepsis Called/Page Received: 11:50  Antibiotics Ordered: pip/tazo and vancomycin   Time of 1st antibiotic administration: 1329  Per RN, family may not want all measures. She will speak with MD to see if we will initiate antibiotics.   Oswald [place] ,PharmD, BCPS Clinical Pharmacist  04/23/2019  12:48 PM

## 2019-05-07 NOTE — ED Notes (Signed)
Pt is difficult stick; unable to obtain all of blood specimens per multiple previous attempts; lab notified to collect necessary specimens.

## 2019-05-07 NOTE — ED Notes (Addendum)
Unable to obtain temp with thermometer via oral or axillary

## 2019-05-07 NOTE — ED Notes (Signed)
Report called to sabrina rn icu nurse

## 2019-05-07 NOTE — Progress Notes (Signed)
Notified bedside nurse of need to administer fluid bolus, pt needs 1980 cc.

## 2019-05-07 NOTE — Consult Note (Signed)
Pharmacy Antibiotic Note  Julia Herrera is a 86 y.o. female admitted on 04/18/2019 with sepsis.  Pharmacy has been consulted for cefepime and vancomycin dosing. COVID positive.   Plan: Cefepime 1 g q24H due to renal function.   Pt received vancomycin 1250 mg loading dose. Will order a 24 hour vancomycin random level and dose by levels.   Height: 5\' 2"  (157.5 cm) Weight: 145 lb 8.1 oz (66 kg) IBW/kg (Calculated) : 50.1  Temp (24hrs), Avg:93 F (33.9 C), Min:93 F (33.9 C), Max:93 F (33.9 C)  Recent Labs  Lab 04/26/2019 1135 05/04/2019 1316 04/17/2019 1351 04/27/2019 1605  WBC  --  16.1*  --   --   CREATININE  --   --   --  5.38*  LATICACIDVEN 4.5*  --  3.5*  --     Estimated Creatinine Clearance: 6.1 mL/min (A) (by C-G formula based on SCr of 5.38 mg/dL (H)).    Allergies  Allergen Reactions  . Allopurinol Other (See Comments)    dizziness  . Fosamax [Alendronate] Nausea Only  . Niaspan [Niacin Er] Other (See Comments)    unknown  . Zocor [Simvastatin] Other (See Comments)    Muscle pain    Antimicrobials this admission: 2/22 pip/tazo x 1 2/22 vancomycin >>   Dose adjustments this admission: None  Microbiology results: 2/22 BCx: pending  Thank you for allowing pharmacy to be a part of this patient's care.  Oswald Hillock, PharmD, BCPS 05/07/2019 6:00 PM

## 2019-05-07 NOTE — Progress Notes (Signed)
Notified bedside nurse of need to draw repeat lactic acid. 

## 2019-05-07 NOTE — Consult Note (Signed)
Remdesivir - Pharmacy Brief Note   A/P:  Remdesivir 200 mg IVPB once followed by 100 mg IVPB daily x 4 days.   Julia Herrera 05/11/2019 4:40 PM

## 2019-05-08 DIAGNOSIS — R9389 Abnormal findings on diagnostic imaging of other specified body structures: Secondary | ICD-10-CM

## 2019-05-08 DIAGNOSIS — Z7189 Other specified counseling: Secondary | ICD-10-CM

## 2019-05-08 DIAGNOSIS — Z515 Encounter for palliative care: Secondary | ICD-10-CM

## 2019-05-08 DIAGNOSIS — N179 Acute kidney failure, unspecified: Secondary | ICD-10-CM

## 2019-05-08 DIAGNOSIS — U071 COVID-19: Secondary | ICD-10-CM

## 2019-05-08 DIAGNOSIS — R6521 Severe sepsis with septic shock: Secondary | ICD-10-CM

## 2019-05-08 DIAGNOSIS — G9341 Metabolic encephalopathy: Secondary | ICD-10-CM

## 2019-05-08 DIAGNOSIS — N189 Chronic kidney disease, unspecified: Secondary | ICD-10-CM

## 2019-05-08 LAB — MRSA PCR SCREENING: MRSA by PCR: NEGATIVE

## 2019-05-08 MED ORDER — MORPHINE BOLUS VIA INFUSION
2.0000 mg | INTRAVENOUS | Status: DC | PRN
  Filled 2019-05-08: qty 2

## 2019-05-08 MED ORDER — LORAZEPAM 1 MG PO TABS: 1.0000 mg | ORAL_TABLET | ORAL | Status: DC | PRN

## 2019-05-08 MED ORDER — GLYCOPYRROLATE 0.2 MG/ML IJ SOLN: 0.2000 mg | INTRAMUSCULAR | Status: DC | PRN

## 2019-05-08 MED ORDER — GLYCOPYRROLATE 1 MG PO TABS
1.0000 mg | ORAL_TABLET | ORAL | Status: DC | PRN
  Filled 2019-05-08: qty 1

## 2019-05-08 MED ORDER — HALOPERIDOL LACTATE 2 MG/ML PO CONC
0.5000 mg | ORAL | Status: DC | PRN
  Filled 2019-05-08: qty 0.3

## 2019-05-08 MED ORDER — BIOTENE DRY MOUTH MT LIQD: 15.0000 mL | OROMUCOSAL | Status: DC | PRN

## 2019-05-08 MED ORDER — LORAZEPAM 2 MG/ML IJ SOLN: 1.0000 mg | INTRAMUSCULAR | Status: DC | PRN

## 2019-05-08 MED ORDER — POLYVINYL ALCOHOL 1.4 % OP SOLN
1.0000 [drp] | Freq: Four times a day (QID) | OPHTHALMIC | Status: DC | PRN
  Filled 2019-05-08: qty 15

## 2019-05-08 MED ORDER — HALOPERIDOL LACTATE 5 MG/ML IJ SOLN: 0.5000 mg | INTRAMUSCULAR | Status: DC | PRN

## 2019-05-08 MED ORDER — LORAZEPAM 2 MG/ML PO CONC: 1.0000 mg | ORAL | Status: DC | PRN

## 2019-05-08 MED ORDER — MORPHINE 100MG IN NS 100ML (1MG/ML) PREMIX INFUSION
3.0000 mg/h | INTRAVENOUS | Status: DC
  Administered 2019-05-08: 3 mg/h via INTRAVENOUS
  Filled 2019-05-08: qty 100

## 2019-05-08 MED ORDER — HALOPERIDOL 0.5 MG PO TABS
0.5000 mg | ORAL_TABLET | ORAL | Status: DC | PRN
  Filled 2019-05-08: qty 1

## 2019-05-12 LAB — CULTURE, BLOOD (ROUTINE X 2)
Culture: NO GROWTH
Culture: NO GROWTH
Special Requests: ADEQUATE
Special Requests: ADEQUATE

## 2019-05-14 NOTE — Death Summary Note (Signed)
DEATH SUMMARY   Patient Details  Name: Julia Herrera MRN: [id number] DOB: [birth year]  Admission/Discharge Information   Admit Date:  2019/05/17  Date of Death: Date of Death: May 18, 2019  Time of Death: Time of Death: 1528/06/15  Length of Stay: 1  Referring Physician: Steele Sizer, MD   Reason(s) for Hospitalization  Septic shock, COVID-19 pneumonia, hypernatremia, acute kidney injury on chronic kidney disease, hyperkalemia  Diagnoses  Preliminary cause of death:  Secondary Diagnoses (including complications and co-morbidities):  Septic shock, COVID-19 pneumonia, hypernatremia, acute kidney injury on chronic kidney disease, hyperkalemia  Brief Hospital Course (including significant findings, care, treatment, and services provided and events leading to death)  Julia Herrera is a 86 y.o. year old female who sent into the hospital with unresponsiveness.  In the ER she was hypotensive, hypothermic.  Chest x-ray was positive for pneumonia.  Patient was started on aggressive antibiotics.  Later her Covid test came back positive and she was started on remdesivir and steroids.  Her blood pressure dropped down again and despite IV fluid she was started on Levophed.  Her chemistry came back very late and her potassium was greater than 7.5 and her creatinine was worse than previous and her sodium was very elevated at 164.  Patient was seen by the critical care specialist and discussed with family about comfort care measures.  The patient was stopped off her Levophed drip and transferred to the medical floor.  I saw her in the morning and she was still unresponsive.  Discussion with family and guardian and we started a morphine drip.  The patient passed away at 1530 on 2019-05-18.    Pertinent Labs and Studies  Significant Diagnostic Studies DG Chest Port 1 View  Result Date: May 17, 2019 CLINICAL DATA:  Shortness of breath.  COVID positive. EXAM: PORTABLE CHEST 1 VIEW COMPARISON:  Chest x-ray dated April 19, 2019. FINDINGS: Normal heart size. Suspected new small pneumomediastinum. Bilateral peripheral predominant patchy opacities again noted, worsened in the right upper lobe and at the left lung base. No pleural effusion or pneumothorax. No acute osseous abnormality. IMPRESSION: 1. Worsening multifocal pneumonia. 2. Suspected new small pneumomediastinum. Electronically Signed   By: Titus Dubin M.D.   On: 17-May-2019 12:08   DG Chest Port 1 View  Result Date: 04/19/2019 CLINICAL DATA:  Cough, hypertension EXAM: PORTABLE CHEST 1 VIEW COMPARISON:  08/08/2018 FINDINGS: Patchy opacities bilaterally with a peripheral predominance. No pleural effusion or pneumothorax. Stable cardiomediastinal contours. IMPRESSION: Bilateral pulmonary opacities suspicious for atypical pneumonia including COVID-19. Electronically Signed   By: Macy Mis M.D.   On: 04/19/2019 13:49    Microbiology Recent Results (from the past 240 hour(s))  Blood Culture (routine x 2)     Status: None (Preliminary result)   Collection Time: 05-17-19 11:36 AM   Specimen: BLOOD  Result Value Ref Range Status   Specimen Description BLOOD LEFT ANTECUBITAL  Final   Special Requests   Final    BOTTLES DRAWN AEROBIC AND ANAEROBIC Blood Culture adequate volume   Culture   Final    NO GROWTH < 24 HOURS Performed at [facility], [address], [place], [place] [address]    Report Status PENDING  Incomplete  Respiratory Panel by RT PCR (Flu A&B, Covid) - Nasopharyngeal Swab     Status: Abnormal   Collection Time: 05-17-19 12:03 PM   Specimen: Nasopharyngeal Swab  Result Value Ref Range Status   SARS Coronavirus 2 by RT PCR POSITIVE (A) NEGATIVE Final  Comment: RESULT CALLED TO, READ BACK BY AND VERIFIED WITH: AMY COYNE AT [id number] 04/29/2019  TFK (NOTE) SARS-CoV-2 target nucleic acids are DETECTED. SARS-CoV-2 RNA is generally detectable in upper respiratory specimens  during the acute phase of infection. Positive results are  indicative of the presence of the identified virus, but do not rule out bacterial infection or co-infection with other pathogens not detected by the test. Clinical correlation with patient history and other diagnostic information is necessary to determine patient infection status. The expected result is Negative. Fact Sheet for Patients:  PinkCheek.be Fact Sheet for Healthcare Providers: GravelBags.it This test is not yet approved or cleared by the [place] FDA and  has been authorized for detection and/or diagnosis of SARS-CoV-2 by FDA under an Emergency Use Authorization (EUA).  This EUA will remain in effect (meaning this test can be used) for  the duration of  the COVID-19 declaration under Section 564(b)(1) of the Act, 21 U.S.C. section 360bbb-3(b)(1), unless the authorization is terminated or revoked sooner.    Influenza A by PCR NEGATIVE NEGATIVE Final   Influenza B by PCR NEGATIVE NEGATIVE Final    Comment: (NOTE) The Xpert Xpress SARS-CoV-2/FLU/RSV assay is intended as an aid in  the diagnosis of influenza from Nasopharyngeal swab specimens and  should not be used as a sole basis for treatment. Nasal washings and  aspirates are unacceptable for Xpert Xpress SARS-CoV-2/FLU/RSV  testing. Fact Sheet for Patients: PinkCheek.be Fact Sheet for Healthcare Providers: GravelBags.it This test is not yet approved or cleared by the [place] FDA and  has been authorized for detection and/or diagnosis of SARS-CoV-2 by  FDA under an Emergency Use Authorization (EUA). This EUA will remain  in effect (meaning this test can be used) for the duration of the  Covid-19 declaration under Section 564(b)(1) of the Act, 21  U.S.C. section 360bbb-3(b)(1), unless the authorization is  terminated or revoked. Performed at [facility], [address]  [place], [place] [address]   Blood Culture (routine x 2)     Status: None (Preliminary result)   Collection Time: 04/22/2019  1:19 PM   Specimen: BLOOD  Result Value Ref Range Status   Specimen Description BLOOD BLOOD LEFT HAND  Final   Special Requests   Final    BOTTLES DRAWN AEROBIC AND ANAEROBIC Blood Culture adequate volume   Culture   Final    NO GROWTH < 24 HOURS Performed at [facility], [address] [place], [place] [address]    Report Status PENDING  Incomplete  MRSA PCR Screening     Status: None   Collection Time: 04/27/2019  8:05 PM   Specimen: Nasopharyngeal  Result Value Ref Range Status   MRSA by PCR NEGATIVE NEGATIVE Final    Comment:        The GeneXpert MRSA Assay (FDA approved for NASAL specimens only), is one component of a comprehensive MRSA colonization surveillance program. It is not intended to diagnose MRSA infection nor to guide or monitor treatment for MRSA infections. Performed at [facility], [address] [place], [place] [address]     Lab Herrera Metabolic Panel: Recent Labs  Lab 04/18/2019 1605  NA 164*  K >7.5*  CL >130*  CO2 11*  GLUCOSE 104*  BUN 130*  CREATININE 5.38*  CALCIUM 7.7*   Liver Function Tests: Recent Labs  Lab 05/07/19 1605  AST 27  ALT 19  ALKPHOS 48  BILITOT 0.7  PROT 4.9*  ALBUMIN 1.9*  Recent Labs  Lab 05/11/2019 1605  LIPASE 953*    CBC: Recent Labs  Lab 04/17/2019 1316  WBC 16.1*  NEUTROABS 14.1*  HGB 13.1  HCT 46.1*  MCV 84.6  PLT 367   Cardiac Enzymes: Recent Labs  Lab 04/23/2019 1605  CKTOTAL 252*   Sepsis Labs: Recent Labs  Lab 04/23/2019 1135 05/09/2019 1316 05/06/2019 1351  WBC  --  16.1*  --   LATICACIDVEN 4.5*  --  3.5*    Procedures/Operations  none   Dion Parrow 2019-05-18, 4:05 PM

## 2019-05-14 NOTE — Plan of Care (Signed)
Patient has transitioned to comfort care at this time. Goal is to keep patient as comfortable as possible, reposition for comfort, hygiene for incontinence and using non-verbal cues, as well as respiratory assessment, provide medications as needed for comfort

## 2019-05-14 NOTE — Progress Notes (Signed)
[facility]  ROUNDING NOTE   Subjective:   [name] admitted to [facility] on 05/02/2019 for Shock [facility]) [R57.9] Abnormal chest x-ray [R93.89] Sepsis ([place]) [A41.9] Multifocal pneumonia [J18.9] Sepsis, due to unspecified organism, unspecified whether acute organ dysfunction present ([place]) [A41.9] COVID-19 [U07.1]  Last seen in our office for chronic kidney disease stage IIIB in 2019 by Dr. Candiss Herrera.   Objective:  Vital signs in last 24 hours:  Temp:  [87.8 F (31 C)-93 F (33.9 C)] 92.7 F (33.7 C) (02/23 0000) Pulse Rate:  [43-73] 65 (02/23 0058) Resp:  [23-42] 32 (02/23 0058) BP: (54-96)/(40-67) 65/55 (02/23 0058) SpO2:  [90 %-100 %] 92 % (02/23 0058) Weight:  [66 kg] 66 kg (02/22 1143)  Weight change:  Filed Weights   05/10/2019 1143  Weight: 66 kg    Intake/Output: I/O last 3 completed shifts: In: 2050 [IV Piggyback:2050] Out: -    Intake/Output this shift:  No intake/output data recorded.  Physical Exam: General: cachectic  Head: Dry oral mucosal membranes  Eyes: Anicteric, PERRL  Neck: Supple, trachea midline  Lungs:  Clear to auscultation  Heart: Regular rate and rhythm  Abdomen:  Soft, nontender,   Extremities:  none peripheral edema.  Neurologic: unresponsive  Skin: No lesions        Basic Metabolic Panel: Recent Labs  Lab 04/29/2019 1605  NA 164*  K >7.5*  CL >130*  CO2 11*  GLUCOSE 104*  BUN 130*  CREATININE 5.38*  CALCIUM 7.7*    Liver Function Tests: Recent Labs  Lab 05/05/2019 1605  AST 27  ALT 19  ALKPHOS 48  BILITOT 0.7  PROT 4.9*  ALBUMIN 1.9*   Recent Labs  Lab 04/16/2019 1605  LIPASE 953*   No results for input(s): AMMONIA in the last 168 hours.  CBC: Recent Labs  Lab 04/20/2019 1316  WBC 16.1*  NEUTROABS 14.1*  HGB 13.1  HCT 46.1*  MCV 84.6  PLT 367    Cardiac Enzymes: Recent Labs  Lab 04/25/2019 1605  CKTOTAL 252*    BNP: Invalid input(s): POCBNP  CBG: Recent Labs  Lab 04/29/2019 1218  04/28/2019 1941  GLUCAP 95 198*    Microbiology: Results for orders placed or performed during the hospital encounter of 04/21/2019  Blood Culture (routine x 2)     Status: None (Preliminary result)   Collection Time: 04/19/2019 11:36 AM   Specimen: BLOOD  Result Value Ref Range Status   Specimen Description BLOOD LEFT ANTECUBITAL  Final   Special Requests   Final    BOTTLES DRAWN AEROBIC AND ANAEROBIC Blood Culture adequate volume   Culture   Final    NO GROWTH < 24 HOURS Performed at [facility], [address] [place], [place] [address]    Report Status PENDING  Incomplete  Respiratory Panel by RT PCR (Flu A&B, Covid) - Nasopharyngeal Swab     Status: Abnormal   Collection Time: 05/07/19 12:03 PM   Specimen: Nasopharyngeal Swab  Result Value Ref Range Status   SARS Coronavirus 2 by RT PCR POSITIVE (A) NEGATIVE Final    Comment: RESULT CALLED TO, READ BACK BY AND VERIFIED WITH: AMY COYNE AT 1556 05/07/2019  TFK (NOTE) SARS-CoV-2 target nucleic acids are DETECTED. SARS-CoV-2 RNA is generally detectable in upper respiratory specimens  during the acute phase of infection. Positive results are indicative of the presence of the identified virus, but do not rule out bacterial infection or co-infection with other pathogens not detected by the  test. Clinical correlation with patient history and other diagnostic information is necessary to determine patient infection status. The expected result is Negative. Fact Sheet for Patients:  PinkCheek.be Fact Sheet for Healthcare Providers: GravelBags.it This test is not yet approved or cleared by the [place] FDA and  has been authorized for detection and/or diagnosis of SARS-CoV-2 by FDA under an Emergency Use Authorization (EUA).  This EUA will remain in effect (meaning this test can be used) for  the duration of  the COVID-19 declaration under Section 564(b)(1) of the  Act, 21 U.S.C. section 360bbb-3(b)(1), unless the authorization is terminated or revoked sooner.    Influenza A by PCR NEGATIVE NEGATIVE Final   Influenza B by PCR NEGATIVE NEGATIVE Final    Comment: (NOTE) The Xpert Xpress SARS-CoV-2/FLU/RSV assay is intended as an aid in  the diagnosis of influenza from Nasopharyngeal swab specimens and  should not be used as a sole basis for treatment. Nasal washings and  aspirates are unacceptable for Xpert Xpress SARS-CoV-2/FLU/RSV  testing. Fact Sheet for Patients: PinkCheek.be Fact Sheet for Healthcare Providers: GravelBags.it This test is not yet approved or cleared by the [place] FDA and  has been authorized for detection and/or diagnosis of SARS-CoV-2 by  FDA under an Emergency Use Authorization (EUA). This EUA will remain  in effect (meaning this test can be used) for the duration of the  Covid-19 declaration under Section 564(b)(1) of the Act, 21  U.S.C. section 360bbb-3(b)(1), unless the authorization is  terminated or revoked. Performed at [facility], [address] [place], [place] [address]   Blood Culture (routine x 2)     Status: None (Preliminary result)   Collection Time: 05/13/2019  1:19 PM   Specimen: BLOOD  Result Value Ref Range Status   Specimen Description BLOOD BLOOD LEFT HAND  Final   Special Requests   Final    BOTTLES DRAWN AEROBIC AND ANAEROBIC Blood Culture adequate volume   Culture   Final    NO GROWTH < 24 HOURS Performed at [facility], [address] [place], [place] [address]    Report Status PENDING  Incomplete  MRSA PCR Screening     Status: None   Collection Time: 04/25/2019  8:05 PM   Specimen: Nasopharyngeal  Result Value Ref Range Status   MRSA by PCR NEGATIVE NEGATIVE Final    Comment:        The GeneXpert MRSA Assay (FDA approved for NASAL specimens only), is one component of a comprehensive MRSA  colonization surveillance program. It is not intended to diagnose MRSA infection nor to guide or monitor treatment for MRSA infections. Performed at [facility], [address] [place], [place] [address]     Coagulation Studies: Recent Labs    04/27/2019 1316  LABPROT 22.6*  INR 2.0*    Urinalysis: Recent Labs    05/06/2019 1216  COLORURINE YELLOW*  LABSPEC 1.016  PHURINE 5.0  GLUCOSEU 50*  HGBUR SMALL*  BILIRUBINUR NEGATIVE  KETONESUR 5*  PROTEINUR 30*  NITRITE NEGATIVE  LEUKOCYTESUR NEGATIVE      Imaging: DG Chest Port 1 View  Result Date: 05/05/2019 CLINICAL DATA:  Shortness of breath.  COVID positive. EXAM: PORTABLE CHEST 1 VIEW COMPARISON:  Chest x-ray dated April 19, 2019. FINDINGS: Normal heart size. Suspected new small pneumomediastinum. Bilateral peripheral predominant patchy opacities again noted, worsened in the right upper lobe and at the left lung base. No pleural effusion or pneumothorax. No acute osseous abnormality. IMPRESSION: 1.  Worsening multifocal pneumonia. 2. Suspected new small pneumomediastinum. Electronically Signed   By: Titus Dubin M.D.   On: 04/23/2019 12:08     Medications:   . sodium chloride Stopped (2019/05/27 0119)  . morphine     . albuterol  10 mg Nebulization Once   acetaminophen **OR** acetaminophen, morphine injection, ondansetron **OR** ondansetron (ZOFRAN) IV  Assessment/ Plan:  [name] is a 86 y.o. black female with dementia, osteoporosis, hyperlipidemia, hypertension, gout who was admitted to [facility] on 05/10/2019 for Shock [facility]) [R57.9] Abnormal chest x-ray [R93.89] Sepsis ([place]) [A41.9] Multifocal pneumonia [J18.9] Sepsis, due to unspecified organism, unspecified whether acute organ dysfunction present ([place]) [A41.9] COVID-19 [U07.1]   1. Acute renal failure 2. Hypernatremia with free water deficity 3. Hyperkalemia 4. Metabolic acidosis  Patient is moving to comfort care. Will sign off.    LOS:  1 Domnique Vanegas 03-14-20219:48 AM

## 2019-05-14 NOTE — Plan of Care (Signed)
Patient deceased at 89.

## 2019-05-14 NOTE — Progress Notes (Signed)
Report called to the Charge Nurse for RM 102

## 2019-05-14 NOTE — Progress Notes (Signed)
Patient ID: Julia Herrera, female   DOB: 1925-06-04, 86 y.o.   MRN: [id number] [facility] PROGRESS NOTE  Julia Herrera [id number] DOB: 03/18/1925 DOA: 05/06/2019 PCP: Steele Sizer, MD  HPI/Subjective: Patient opened eyes to stimulation but that is all the response that I got.  Objective: Vitals:   05/29/2019 0000 05-29-2019 0058  BP: (!) 58/45 (!) 65/55  Pulse: 67 65  Resp: (!) 26 (!) 32  Temp: (!) 92.7 F (33.7 C)   SpO2: 92% 92%    Intake/Output Summary (Last 24 hours) at 05-29-19 0801 Last data filed at 29-May-2019 0300 Gross per 24 hour  Intake 2050 ml  Output --  Net 2050 ml   Filed Weights   04/29/2019 1143  Weight: 66 kg    ROS: Review of Systems  Unable to perform ROS: Acuity of condition   Exam: Physical Exam  HENT:  Nose: No mucosal edema.  Unable to look in mouth  Eyes: Conjunctivae and lids are normal.  Neck: Carotid bruit is not present.  Cardiovascular: S1 normal, S2 normal and normal heart sounds.  Respiratory: She has decreased breath sounds in the right lower field and the left lower field. She has no wheezes. She has no rhonchi. She has no rales.  GI: Soft. There is no abdominal tenderness.  Musculoskeletal:     Right ankle: No swelling.     Left ankle: No swelling.  Lymphadenopathy:    She has cervical adenopathy.  Neurological:  Opened eyes to stimulation  Skin: Skin is warm. No rash noted.  Psychiatric:  Opens eyes to stimulation      Data Reviewed: Basic Metabolic Panel: Recent Labs  Lab 05/11/2019 1605  NA 164*  K >7.5*  CL >130*  CO2 11*  GLUCOSE 104*  BUN 130*  CREATININE 5.38*  CALCIUM 7.7*   Liver Function Tests: Recent Labs  Lab 04/22/2019 1605  AST 27  ALT 19  ALKPHOS 48  BILITOT 0.7  PROT 4.9*  ALBUMIN 1.9*   Recent Labs  Lab 05/10/2019 1605  LIPASE 953*   No results for input(s): AMMONIA in the last 168 hours. CBC: Recent Labs  Lab 05/02/2019 1316  WBC 16.1*  NEUTROABS 14.1*  HGB 13.1  HCT 46.1*   MCV 84.6  PLT 367   Cardiac Enzymes: Recent Labs  Lab 04/18/2019 1605  CKTOTAL 252*    CBG: Recent Labs  Lab 04/28/2019 1218 04/20/2019 1941  GLUCAP 95 198*    Recent Results (from the past 240 hour(s))  Blood Culture (routine x 2)     Status: None (Preliminary result)   Collection Time: 04/30/2019 11:36 AM   Specimen: BLOOD  Result Value Ref Range Status   Specimen Description BLOOD LEFT ANTECUBITAL  Final   Special Requests   Final    BOTTLES DRAWN AEROBIC AND ANAEROBIC Blood Culture adequate volume   Culture   Final    NO GROWTH < 24 HOURS Performed at [facility], [address] [place], [place] [address]    Report Status PENDING  Incomplete  Respiratory Panel by RT PCR (Flu A&B, Covid) - Nasopharyngeal Swab     Status: Abnormal   Collection Time: 05/07/19 12:03 PM   Specimen: Nasopharyngeal Swab  Result Value Ref Range Status   SARS Coronavirus 2 by RT PCR POSITIVE (A) NEGATIVE Final    Comment: RESULT CALLED TO, READ BACK BY AND VERIFIED WITH: AMY COYNE AT 1556 05/07/2019  TFK (NOTE) SARS-CoV-2 target nucleic acids are DETECTED. SARS-CoV-2 RNA  is generally detectable in upper respiratory specimens  during the acute phase of infection. Positive results are indicative of the presence of the identified virus, but do not rule out bacterial infection or co-infection with other pathogens not detected by the test. Clinical correlation with patient history and other diagnostic information is necessary to determine patient infection status. The expected result is Negative. Fact Sheet for Patients:  PinkCheek.be Fact Sheet for Healthcare Providers: GravelBags.it This test is not yet approved or cleared by the [place] FDA and  has been authorized for detection and/or diagnosis of SARS-CoV-2 by FDA under an Emergency Use Authorization (EUA).  This EUA will remain in effect (meaning this test can be  used) for  the duration of  the COVID-19 declaration under Section 564(b)(1) of the Act, 21 U.S.C. section 360bbb-3(b)(1), unless the authorization is terminated or revoked sooner.    Influenza A by PCR NEGATIVE NEGATIVE Final   Influenza B by PCR NEGATIVE NEGATIVE Final    Comment: (NOTE) The Xpert Xpress SARS-CoV-2/FLU/RSV assay is intended as an aid in  the diagnosis of influenza from Nasopharyngeal swab specimens and  should not be used as a sole basis for treatment. Nasal washings and  aspirates are unacceptable for Xpert Xpress SARS-CoV-2/FLU/RSV  testing. Fact Sheet for Patients: PinkCheek.be Fact Sheet for Healthcare Providers: GravelBags.it This test is not yet approved or cleared by the [place] FDA and  has been authorized for detection and/or diagnosis of SARS-CoV-2 by  FDA under an Emergency Use Authorization (EUA). This EUA will remain  in effect (meaning this test can be used) for the duration of the  Covid-19 declaration under Section 564(b)(1) of the Act, 21  U.S.C. section 360bbb-3(b)(1), unless the authorization is  terminated or revoked. Performed at [facility], [address] [place], [place] [address]   Blood Culture (routine x 2)     Status: None (Preliminary result)   Collection Time: 04/19/2019  1:19 PM   Specimen: BLOOD  Result Value Ref Range Status   Specimen Description BLOOD BLOOD LEFT HAND  Final   Special Requests   Final    BOTTLES DRAWN AEROBIC AND ANAEROBIC Blood Culture adequate volume   Culture   Final    NO GROWTH < 24 HOURS Performed at [facility], [address] [place], [place] [address]    Report Status PENDING  Incomplete  MRSA PCR Screening     Status: None   Collection Time: 04/26/2019  8:05 PM   Specimen: Nasopharyngeal  Result Value Ref Range Status   MRSA by PCR NEGATIVE NEGATIVE Final    Comment:        The GeneXpert MRSA Assay  (FDA approved for NASAL specimens only), is one component of a comprehensive MRSA colonization surveillance program. It is not intended to diagnose MRSA infection nor to guide or monitor treatment for MRSA infections. Performed at [facility], [address], [place], [place] [address]      Studies: [facility] Chest [place] 1 View  Result Date: 05/05/2019 CLINICAL DATA:  Shortness of breath.  COVID positive. EXAM: PORTABLE CHEST 1 VIEW COMPARISON:  Chest x-ray dated April 19, 2019. FINDINGS: Normal heart size. Suspected new small pneumomediastinum. Bilateral peripheral predominant patchy opacities again noted, worsened in the right upper lobe and at the left lung base. No pleural effusion or pneumothorax. No acute osseous abnormality. IMPRESSION: 1. Worsening multifocal pneumonia. 2. Suspected new small pneumomediastinum. Electronically Signed   By: Orville Govern.D.  On: 04/25/2019 12:08    Scheduled Meds: . albuterol  10 mg Nebulization Once   Continuous Infusions: . sodium chloride Stopped (06-07-2019 0119)  . morphine      Assessment/Plan:  1. Septic shock, present on admission with COVID-19 pneumonia, acute metabolic encephalopathy, acute kidney injury, hypothermia, leukocytosis 2. Severe hypernatremia 3. Severe hyperkalemia 4. Acute kidney injury on chronic kidney disease stage IV 5. Dementia 6. Hyperlipidemia unspecified  Patient made comfort care measures last night and Levophed was stopped.  I will start a morphine drip this morning and continue to monitor.  Last blood pressures have been very low.  I do not expect the patient to survive much longer.  Case discussed with son and daughter-in-law on the phone  Code Status:     Code Status Orders  (From admission, onward)         Start     Ordered   05/06/2019 1501  Do not attempt resuscitation (DNR)  Continuous    Question Answer Comment  In the event of cardiac or respiratory ARREST Do not call a "code blue"    In the event of cardiac or respiratory ARREST Do not perform Intubation, CPR, defibrillation or ACLS   In the event of cardiac or respiratory ARREST Use medication by any route, position, wound care, and other measures to relive pain and suffering. May use oxygen, suction and manual treatment of airway obstruction as needed for comfort.   Comments nurse may pronounce      04/23/2019 1501        Code Status History    Date Active Date Inactive Code Status Order ID Comments User Context   04/21/2019 1427 04/21/2019 1501 DNR [id number]  Delman Kitten, MD ED   02/16/2018 2034 02/17/2018 1740 Full Code [id number]  Henreitta Leber, MD Inpatient   Advance Care Planning Activity     Family Communication: Spoke with son and daughter-in-law on the phone Disposition Plan: Comfort care measures here in the hospital.  Starting morphine drip.  Time spent: 28 minutes  [place]

## 2019-05-14 NOTE — Progress Notes (Signed)
Pt passed at 1530. This nurse as well as another nurse present at bedside when patient passed. Death confirm by both RN's per [facility] protocol. Hospitalist, Nursing supervisor, pt's family as well as guardian notified of patient passing. Supervisor to notify [place] home, as well as [employer]. All belongings at bedside with patient.

## 2019-05-14 NOTE — Consult Note (Signed)
Consultation Note Date: 2019-06-06   Patient Name: Julia Herrera  DOB: 25-Sep-1925  MRN: [id number]  Age / Sex: 86 y.o., female  PCP: Steele Sizer, MD Referring Physician: Loletha Grayer, MD  Reason for Consultation: Establishing goals of care  HPI/Patient Profile: 86 y.o. female  with past medical history of dementia, CKD, gout, HLD, HTN resident of SNF under guardianship of DSS admitted on 04/23/2019 with septic shock r/t multifocal pneumonia, COVID+. Initially on pressors, transitioned to comfort by primary team overnight. Palliative medicine consulted for assistance with end of life care.    Clinical Assessment and Goals of Care: Patient lying in bed, does not open eyes to touch or voice. Her RR and work of breathing are increased.  I called her DSS guardian- Pharmacist, hospital. Discussed patient's poor condition and poor overall prognosis for meaningful recovery. Reviewed what comfort measures look like for patient. Carly requested a MOST form be faxed to her for signing indicating DNR status and comfort measures only, no IV fluids, no antibiotics, no feeding tube.   Primary Decision Maker LEGAL GUARDIAN    SUMMARY OF RECOMMENDATIONS -Continue current comfort measures -D/C cardiac monitoring and frequent vitals -D/C interventions not indicated for comfort -MOST faxed to DSS per their request    Code Status/Advance Care Planning:  DNR   Palliative Prophylaxis:   Frequent Pain Assessment  Additional Recommendations (Limitations, Scope, Preferences):  Full Comfort Care  Prognosis:    Hours - Days  Discharge Planning: Anticipated Hospital Death  Primary Diagnoses: Present on Admission: **None**   I have reviewed the medical record, interviewed the patient and family, and examined the patient. The following aspects are pertinent.  Past Medical History:  Diagnosis Date  . Chronic kidney  disease   . Dementia ([place])   . Gout   . Hyperlipidemia   . Hypertension   . Osteoporosis    Social History   Socioeconomic History  . Marital status: Single    Spouse name: Not on file  . Number of children: 3  . Years of education: Not on file  . Highest education level: 7th grade  Occupational History    Employer: Retired  Tobacco Use  . Smoking status: Never Smoker  . Smokeless tobacco: Current User    Types: Snuff  Substance and Sexual Activity  . Alcohol use: No    Alcohol/week: 0.0 standard drinks  . Drug use: No  . Sexual activity: Not Currently    Partners: Male  Other Topics Concern  . Not on file  Social History Narrative  . Not on file   Social Determinants of Health   Financial Resource Strain:   . Difficulty of Paying Living Expenses: Not on file  Food Insecurity:   . Worried About Charity fundraiser in the Last Year: Not on file  . Ran Out of Food in the Last Year: Not on file  Transportation Needs:   . Lack of Transportation (Medical): Not on file  . Lack of Transportation (Non-Medical): Not on file  Physical  Activity:   . Days of Exercise per Week: Not on file  . Minutes of Exercise per Session: Not on file  Stress:   . Feeling of Stress : Not on file  Social Connections:   . Frequency of Communication with Friends and Family: Not on file  . Frequency of Social Gatherings with Friends and Family: Not on file  . Attends Religious Services: Not on file  . Active Member of Clubs or Organizations: Not on file  . Attends Archivist Meetings: Not on file  . Marital Status: Not on file   Family History  Problem Relation Age of Onset  . Alcohol abuse Mother   . Heart attack Father   . Heart disease Brother    Scheduled Meds: . albuterol  10 mg Nebulization Once   Continuous Infusions: . sodium chloride Stopped (May 11, 2019 0119)  . morphine     PRN Meds:.acetaminophen **OR** acetaminophen, morphine injection, ondansetron **OR**  ondansetron (ZOFRAN) IV Medications Prior to Admission:  Prior to Admission medications   Medication Sig Start Date End Date Taking? Authorizing Provider  acetaminophen (TYLENOL) 500 MG tablet Take 500 mg by mouth every 6 (six) hours as needed for mild pain or fever.   Yes [provider]  albuterol (PROVENTIL) (2.5 MG/3ML) 0.083% nebulizer solution Take 2.5 mg by nebulization every 6 (six) hours as needed for wheezing or shortness of breath.   Yes [provider]  allopurinol (ZYLOPRIM) 100 MG tablet Take 1 tablet (100 mg total) by mouth 2 (two) times daily. 06/21/18  Yes Sowles, Drue Stager, MD  alum & mag hydroxide-simeth (MI-ACID) 200-200-20 MG/5ML suspension Take 30 mLs by mouth every 6 (six) hours as needed for indigestion or heartburn.   Yes [provider]  aspirin EC 81 MG tablet Take 81 mg by mouth daily.   Yes [provider]  atorvastatin (LIPITOR) 20 MG tablet Take 1 tablet (20 mg total) by mouth at bedtime. 06/21/18  Yes Sowles, Drue Stager, MD  divalproex (DEPAKOTE) 250 MG DR tablet Take 250 mg by mouth 2 (two) times daily.   Yes [provider]  donepezil (ARICEPT) 10 MG tablet Take 10 mg by mouth at bedtime. 03/23/19  Yes [provider]  guaifenesin (ROBITUSSIN) 100 MG/5ML syrup Take 200 mg by mouth every 6 (six) hours as needed for cough.   Yes [provider]  hyoscyamine (LEVSIN SL) 0.125 MG SL tablet Place 0.125 mg under the tongue every 4 (four) hours as needed (secretions).   Yes [provider]  loperamide (IMODIUM) 2 MG capsule Take 2 mg by mouth as needed for diarrhea or loose stools. (max 8 doses per 24 hours)   Yes [provider]  LORazepam (ATIVAN) 0.5 MG tablet Take 0.5 mg by mouth every 4 (four) hours as needed (agitation).   Yes [provider]  magnesium hydroxide (MILK OF MAGNESIA) 400 MG/5ML suspension Take 30 mLs by mouth at bedtime as needed for constipation.   Yes [provider]  Morphine Sulfate (MORPHINE CONCENTRATE) 10 mg / 0.5 ml concentrated solution Take 5 mg by mouth every 4 (four) hours as needed for severe pain.   Yes [provider]  neomycin-bacitracin-polymyxin (NEOSPORIN) ointment Apply 1 application topically as needed for wound care.   Yes [provider]  omeprazole (PRILOSEC) 20 MG capsule TAKE 1 CAPSULE BY MOUTH DAILY Patient taking differently: Take 20 mg by mouth daily.  08/24/18  Yes Sowles, Drue Stager, MD  potassium chloride SA (KLOR-CON) 20 MEQ  tablet Take 20 mEq by mouth 2 (two) times daily. 03/16/19  Yes [provider]  risperiDONE (RISPERDAL) 0.5 MG tablet Take 1 tablet (0.5 mg total) by mouth daily. 12/10/18  Yes Patrecia Pour, NP  vitamin B-12 (CYANOCOBALAMIN) 1000 MCG tablet Take 1,000 mcg by mouth daily.   Yes [provider]   Allergies  Allergen Reactions  . Allopurinol Other (See Comments)    dizziness  . Fosamax [Alendronate] Nausea Only  . Niaspan [Niacin Er] Other (See Comments)    unknown  . Zocor [Simvastatin] Other (See Comments)    Muscle pain   Review of Systems  Unable to perform ROS: Acuity of condition    Physical Exam Vitals and nursing note reviewed.  Constitutional:      Appearance: She is ill-appearing.  Cardiovascular:     Rate and Rhythm: Normal rate and regular rhythm.  Pulmonary:     Comments: Increased rate and effort Skin:    General: Skin is warm.  Neurological:     Comments: Non responsive     Vital Signs: BP (!) 65/55   Pulse 65   Temp (!) 92.7 F (33.7 C) (Rectal)   Resp (!) 32   Ht 5\' 2"  (1.575 m)   Wt 66 kg   SpO2 92%   BMI 26.61 kg/m  Pain Scale: Faces POSS *See Group Information*: S-Acceptable,Sleep, easy to arouse Pain Score: 0-No pain   SpO2: SpO2: 92 % O2 Device:SpO2: 92 % O2 Flow Rate: .O2 Flow Rate (L/min): 2 L/min  IO: Intake/output summary:   Intake/Output Summary (Last 24 hours) at 05-12-19 1017 Last data filed at 2019/05/12  0300 Gross per 24 hour  Intake 2050 ml  Output --  Net 2050 ml    LBM: Last BM Date: (unknown) Baseline Weight: Weight: 66 kg Most recent weight: Weight: 66 kg     Palliative Assessment/Data: PPS: 10%     Thank you for this consult. Palliative medicine will continue to follow and assist as needed.   Time In: 0900 Time Out: 1010 Time Total: 70 minutes Greater than 50%  of this time was spent counseling and coordinating care related to the above assessment and plan.  Signed by: Mariana Kaufman, AGNP-C Palliative Medicine    Please contact Palliative Medicine Team phone at [phone] for questions and concerns.  For individual provider: See Shea Evans

## 2019-05-14 DEATH — deceased

## 2020-08-02 IMAGING — DX DG CHEST 1V PORT
1 series · 1 of 1 positions shown · non-contrast
Comparison: 08/08/2018

CLINICAL DATA: Cough, hypertension

EXAM:
PORTABLE CHEST 1 VIEW

[chest ap]
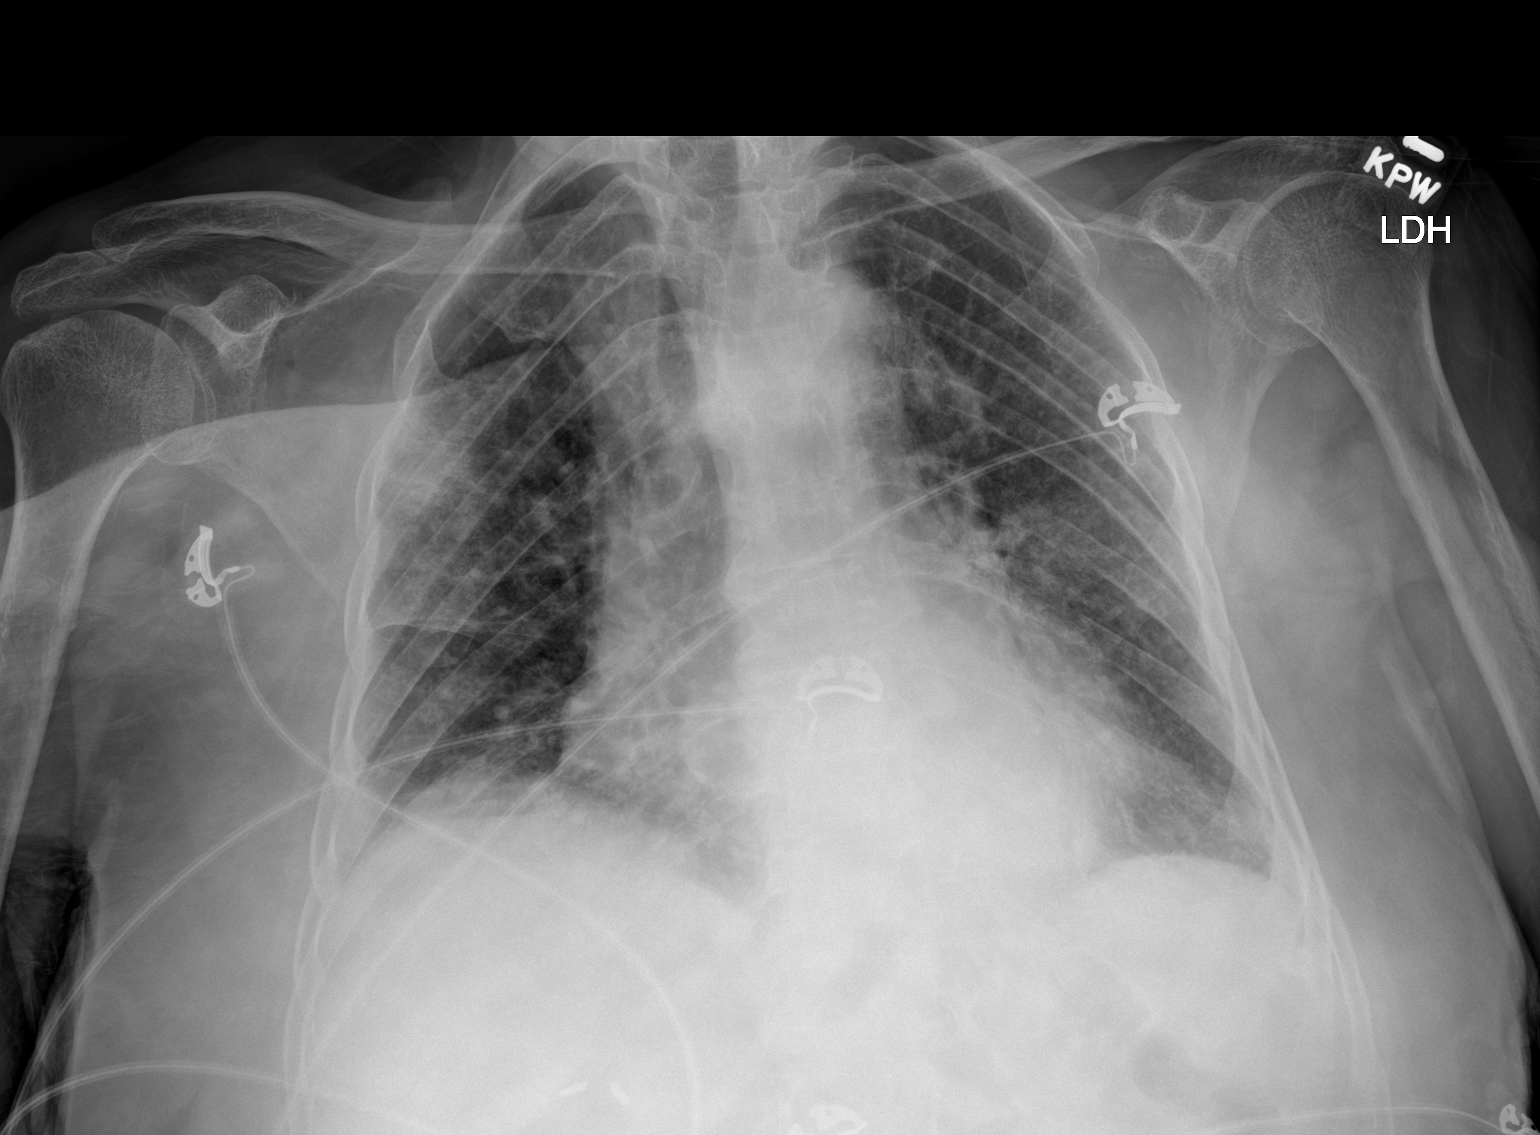

[1 of 1 positions shown; findings below may reference images not displayed]

FINDINGS: Patchy opacities bilaterally with a peripheral predominance. No
pleural effusion or pneumothorax. Stable cardiomediastinal contours.
IMPRESSION: Bilateral pulmonary opacities suspicious for atypical pneumonia
including [id number].

## 2020-08-20 IMAGING — DX DG CHEST 1V PORT
1 series · 1 of 1 positions shown · non-contrast
Comparison: Chest x-ray dated April 19, 2019.

CLINICAL DATA: Shortness of breath.  COVID positive.

EXAM:
PORTABLE CHEST 1 VIEW

[chest ap]
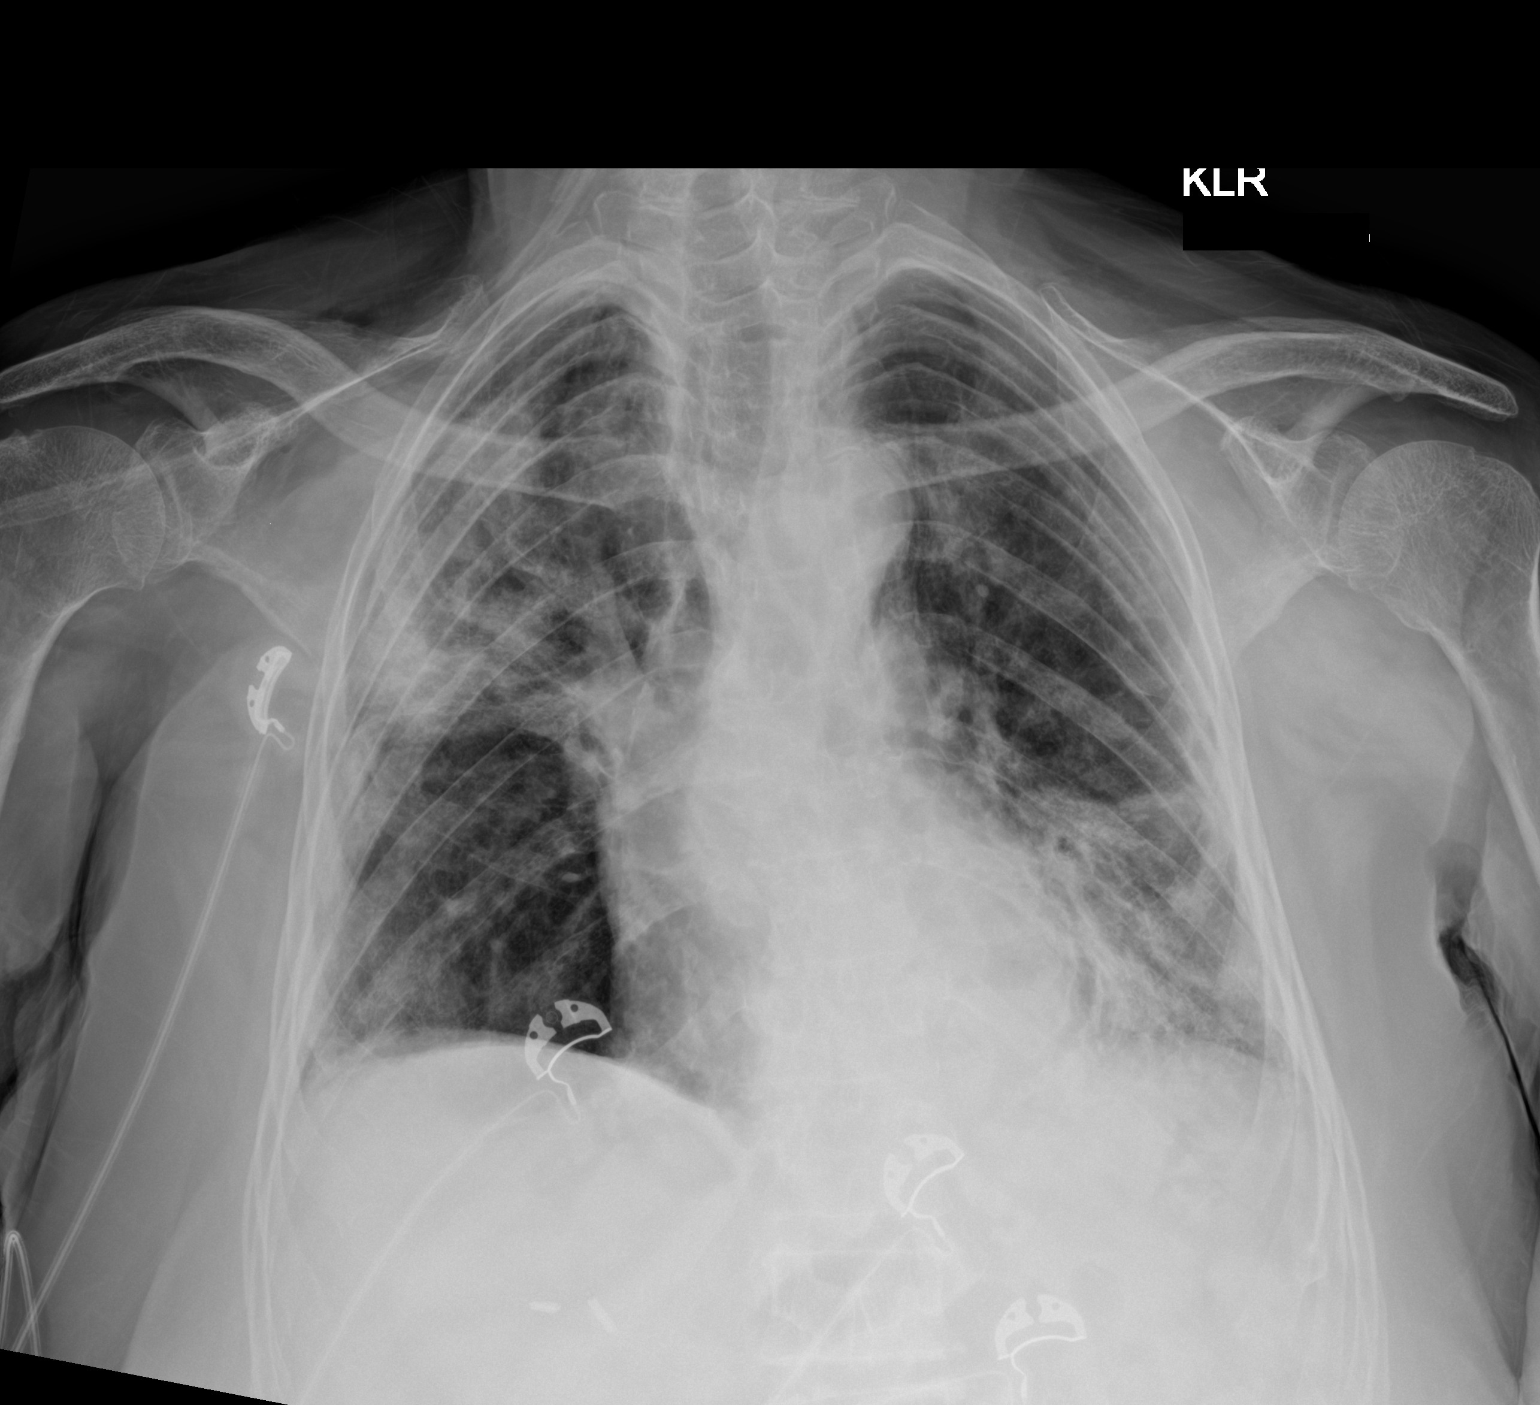

[1 of 1 positions shown; findings below may reference images not displayed]

FINDINGS: Normal heart size. Suspected new small pneumomediastinum. Bilateral
peripheral predominant patchy opacities again noted, worsened in the
right upper lobe and at the left lung base. No pleural effusion or
pneumothorax. No acute osseous abnormality.
IMPRESSION: 1. Worsening multifocal pneumonia.
2. Suspected new small pneumomediastinum.
